# Patient Record
Sex: Female | Born: 1945 | Race: White | Hispanic: No | Marital: Married | Smoking: Never smoker
Health system: Southern US, Community
[De-identification: ages and names within clinical notes are randomized; demographics above are authoritative.]

## PROBLEM LIST (undated history)

## (undated) DIAGNOSIS — N200 Calculus of kidney: Secondary | ICD-10-CM

## (undated) DIAGNOSIS — T7840XA Allergy, unspecified, initial encounter: Secondary | ICD-10-CM

## (undated) DIAGNOSIS — M1990 Unspecified osteoarthritis, unspecified site: Secondary | ICD-10-CM

## (undated) DIAGNOSIS — Z87442 Personal history of urinary calculi: Secondary | ICD-10-CM

## (undated) DIAGNOSIS — Z87440 Personal history of urinary (tract) infections: Secondary | ICD-10-CM

## (undated) DIAGNOSIS — N201 Calculus of ureter: Secondary | ICD-10-CM

## (undated) DIAGNOSIS — Z8719 Personal history of other diseases of the digestive system: Secondary | ICD-10-CM

## (undated) DIAGNOSIS — R319 Hematuria, unspecified: Secondary | ICD-10-CM

## (undated) DIAGNOSIS — E119 Type 2 diabetes mellitus without complications: Secondary | ICD-10-CM

## (undated) DIAGNOSIS — J309 Allergic rhinitis, unspecified: Secondary | ICD-10-CM

## (undated) DIAGNOSIS — K219 Gastro-esophageal reflux disease without esophagitis: Secondary | ICD-10-CM

## (undated) DIAGNOSIS — I10 Essential (primary) hypertension: Secondary | ICD-10-CM

## (undated) DIAGNOSIS — Z9289 Personal history of other medical treatment: Secondary | ICD-10-CM

## (undated) DIAGNOSIS — H269 Unspecified cataract: Secondary | ICD-10-CM

## (undated) DIAGNOSIS — Z973 Presence of spectacles and contact lenses: Secondary | ICD-10-CM

## (undated) DIAGNOSIS — I499 Cardiac arrhythmia, unspecified: Secondary | ICD-10-CM

## (undated) DIAGNOSIS — Z8619 Personal history of other infectious and parasitic diseases: Secondary | ICD-10-CM

## (undated) DIAGNOSIS — R3915 Urgency of urination: Secondary | ICD-10-CM

## (undated) DIAGNOSIS — R350 Frequency of micturition: Secondary | ICD-10-CM

## (undated) DIAGNOSIS — R351 Nocturia: Secondary | ICD-10-CM

## (undated) HISTORY — DX: Personal history of urinary (tract) infections: Z87.440

## (undated) HISTORY — PX: EXTRACORPOREAL SHOCK WAVE LITHOTRIPSY: SHX1557

## (undated) HISTORY — PX: JOINT REPLACEMENT: SHX530

## (undated) HISTORY — DX: Allergy, unspecified, initial encounter: T78.40XA

## (undated) HISTORY — DX: Gastro-esophageal reflux disease without esophagitis: K21.9

## (undated) HISTORY — PX: OTHER SURGICAL HISTORY: SHX169

## (undated) HISTORY — DX: Personal history of other infectious and parasitic diseases: Z86.19

## (undated) HISTORY — DX: Unspecified cataract: H26.9

## (undated) HISTORY — PX: BREAST SURGERY: SHX581

## (undated) HISTORY — PX: CYSTOSCOPY/RETROGRADE/URETEROSCOPY/STONE EXTRACTION WITH BASKET: SHX5317

## (undated) HISTORY — DX: Essential (primary) hypertension: I10

---

## 2001-01-12 ENCOUNTER — Encounter: Payer: Self-pay | Admitting: *Deleted

## 2001-01-12 ENCOUNTER — Encounter: Admission: RE | Admit: 2001-01-12 | Discharge: 2001-01-12 | Payer: Self-pay | Admitting: *Deleted

## 2004-05-26 HISTORY — PX: MENISCUS REPAIR: SHX5179

## 2004-09-20 ENCOUNTER — Other Ambulatory Visit: Admission: RE | Admit: 2004-09-20 | Discharge: 2004-09-20 | Payer: Self-pay | Admitting: Family Medicine

## 2005-09-15 ENCOUNTER — Emergency Department (HOSPITAL_COMMUNITY): Admission: EM | Admit: 2005-09-15 | Discharge: 2005-09-15 | Payer: Self-pay | Admitting: Emergency Medicine

## 2008-04-17 ENCOUNTER — Encounter: Admission: RE | Admit: 2008-04-17 | Discharge: 2008-04-17 | Payer: Self-pay | Admitting: Family Medicine

## 2008-05-16 ENCOUNTER — Ambulatory Visit (HOSPITAL_COMMUNITY): Admission: RE | Admit: 2008-05-16 | Discharge: 2008-05-16 | Payer: Self-pay | Admitting: Gastroenterology

## 2010-10-29 ENCOUNTER — Ambulatory Visit (INDEPENDENT_AMBULATORY_CARE_PROVIDER_SITE_OTHER): Payer: BC Managed Care – PPO | Admitting: Family Medicine

## 2010-10-29 ENCOUNTER — Encounter: Payer: Self-pay | Admitting: Family Medicine

## 2010-10-29 DIAGNOSIS — I10 Essential (primary) hypertension: Secondary | ICD-10-CM

## 2010-10-29 DIAGNOSIS — E118 Type 2 diabetes mellitus with unspecified complications: Secondary | ICD-10-CM | POA: Insufficient documentation

## 2010-10-29 DIAGNOSIS — E119 Type 2 diabetes mellitus without complications: Secondary | ICD-10-CM

## 2010-10-29 DIAGNOSIS — Z87442 Personal history of urinary calculi: Secondary | ICD-10-CM

## 2010-10-29 DIAGNOSIS — J309 Allergic rhinitis, unspecified: Secondary | ICD-10-CM

## 2010-10-29 DIAGNOSIS — E1169 Type 2 diabetes mellitus with other specified complication: Secondary | ICD-10-CM | POA: Insufficient documentation

## 2010-10-29 DIAGNOSIS — K219 Gastro-esophageal reflux disease without esophagitis: Secondary | ICD-10-CM

## 2010-10-29 DIAGNOSIS — E785 Hyperlipidemia, unspecified: Secondary | ICD-10-CM

## 2010-10-29 DIAGNOSIS — J3089 Other allergic rhinitis: Secondary | ICD-10-CM | POA: Insufficient documentation

## 2010-10-29 MED ORDER — ATORVASTATIN CALCIUM 10 MG PO TABS: 10.0000 mg | ORAL_TABLET | Freq: Every day | ORAL | Status: DC

## 2010-10-29 MED ORDER — HYDROCHLOROTHIAZIDE 12.5 MG PO CAPS: 12.5000 mg | ORAL_CAPSULE | Freq: Every day | ORAL | Status: DC

## 2010-10-29 MED ORDER — POTASSIUM CHLORIDE CRYS ER 20 MEQ PO TBCR: 20.0000 meq | EXTENDED_RELEASE_TABLET | Freq: Every day | ORAL | Status: DC

## 2010-10-29 MED ORDER — OMEPRAZOLE 20 MG PO CPDR: 20.0000 mg | DELAYED_RELEASE_CAPSULE | Freq: Every day | ORAL | Status: DC

## 2010-10-29 MED ORDER — FEXOFENADINE HCL 180 MG PO TABS: 180.0000 mg | ORAL_TABLET | Freq: Every day | ORAL | Status: DC

## 2010-10-29 MED ORDER — METFORMIN HCL 500 MG PO TABS: 500.0000 mg | ORAL_TABLET | Freq: Every day | ORAL | Status: DC

## 2010-10-29 MED ORDER — MOMETASONE FUROATE 50 MCG/ACT NA SUSP: 2.0000 | Freq: Every day | NASAL | Status: DC

## 2010-10-29 NOTE — Assessment & Plan Note (Addendum)
Very well controlled Lab at work and f/u PE in 6 mo Refilled metformin Nl foot exam utd opthy

## 2010-10-29 NOTE — Assessment & Plan Note (Addendum)
Consider change hctz to ace at next visit for renal prot Well controlled Disc lifestyle habits  F/u 6 mo

## 2010-10-29 NOTE — Patient Instructions (Signed)
Please send to previous physician for dexa, mammogram, last physical  Take vitamin D3 4000 iu daily  Schedule PE in 6 months (pt will bring her own labs)  Stay active  Eat healthy

## 2010-10-29 NOTE — Progress Notes (Signed)
Subjective:    Patient ID: Laurie Tran, female    DOB: [birth year], 65 y.o.   MRN: [id number]  HPI Here to establish as a new pt  Saw Dr Sonia Baller and then Dr Raquel James Last health mt exam was ? Within the year   Sees Dr Patsi Sears in past for urology Has a cardiolgist   Wants to check a mole on back- wants to check that    DM- on metformin  Is well controlled  Last A1c was 5.9  Eats well and takes care of herself  Denies the sweet tooth  Also exercises regularly - garden and swim and walking   HTN- on hctz  bp good 128/80 Is very well controlled No headache or cp or sob/ edema    GERD- on prilosec Controls symptoms very well -- has been on for 2 years (severe reflux before that)  Used to have heartburn- now never No hoarseness or cough or throat clearing  Hx of kidney stones Not much of a problem lately Has to avoid calcium  She does drink a lot of water   Does take vitamin D  Takes up to 6000 iu per day -- vit d level 66 Has had vit D level before   2/10 foot dexa 1.0 Also had full dexa - in past -need records for that  Thinks it was normal    Last labs were recent - gets at work at [employer]  Hyperlipidemia - on lipitor Also low sat fat diet  No problems  Well controlled    Has allergies Seasonal and environmental and possible food  ? What exactly she is all to   Works at [employer] -- really likes it there  Non smoker  No alcohol   Wants to check mole on back - watching it   Patient Active Problem List  Diagnoses  . DM type 2 (diabetes mellitus, type 2)  . GERD (gastroesophageal reflux disease)  . HTN (hypertension)  . Hyperlipidemia  . History of kidney stones  . Allergic rhinitis   Past Medical History  Diagnosis Date  . History of chicken pox   . Diabetes mellitus   . GERD (gastroesophageal reflux disease)   . Allergy   . Hypertension   . Kidney stone   . History of UTI    Past Surgical History  Procedure Date  .  Lipotripsy   . J basket retrieval     for kidney stones  . Meniscus repair 2006    rt knee  . Breast surgery 1085    breast biopsy benign   History  Substance Use Topics  . Smoking status: Never Smoker   . Smokeless tobacco: Not on file  . Alcohol Use: No   Family History  Problem Relation Age of Onset  . Cancer Mother     breast CA  . Arthritis Paternal Grandmother   . Stroke Paternal Grandmother   . Diabetes Paternal Grandmother    Allergies  Allergen Reactions  . Celebrex (Celecoxib) Hives    And congestion  . Vioxx (Rofecoxib) Hives    And congestion   No current outpatient prescriptions on file prior to visit.     Review of Systems Review of Systems  Constitutional: Negative for fever, appetite change, fatigue and unexpected weight change.  Eyes: Negative for pain and visual disturbance.  Respiratory: Negative for cough and shortness of breath.   Cardiovascular: Negative.   Gastrointestinal: Negative for nausea, diarrhea and constipation.  Genitourinary: Negative for  urgency and frequency.  Skin: Negative for pallor. pos for mole on back - raised and irritates clothing  Neurological: Negative for weakness, light-headedness, numbness and headaches.  Hematological: Negative for adenopathy. Does not bruise/bleed easily.  Psychiatric/Behavioral: Negative for dysphoric mood. The patient is not nervous/anxious.          Objective:   Physical Exam  Constitutional: She appears well-developed and well-nourished. No distress.  HENT:  Head: Normocephalic and atraumatic.  Right Ear: External ear normal.  Left Ear: External ear normal.  Nose: Nose normal.  Mouth/Throat: Oropharynx is clear and moist.  Eyes: Conjunctivae and EOM are normal. Pupils are equal, round, and reactive to light.  Neck: Normal range of motion. Neck supple. No JVD present. Carotid bruit is not present. No thyromegaly present.  Cardiovascular: Normal rate, regular rhythm, normal heart sounds  and intact distal pulses.   Pulmonary/Chest: Effort normal and breath sounds normal. No respiratory distress. She has no wheezes. She exhibits no tenderness.  Abdominal: Soft. Bowel sounds are normal. She exhibits no distension and no abdominal bruit. There is no tenderness.  Musculoskeletal: Normal range of motion. She exhibits no edema and no tenderness.  Lymphadenopathy:    She has no cervical adenopathy.  Neurological: She is alert. She has normal reflexes. Coordination normal.  Skin: Skin is warm and dry. No rash noted. No erythema. No pallor.  Psychiatric: She has a normal mood and affect.          Assessment & Plan:

## 2010-10-31 NOTE — Assessment & Plan Note (Signed)
Seasonal and controlled by allegra Disc allergen avoidance Refilled med - she can get by px

## 2010-10-31 NOTE — Assessment & Plan Note (Signed)
Well controlled with lipitor and diet  Rev last labs with pt in detail  Rev low sat fat diet  F/u 6 mo  Refilled lipitor

## 2010-10-31 NOTE — Assessment & Plan Note (Signed)
Doing well  Enc fluids Will eventually need to start some ca for bone protection even though they are ca oxalate stones

## 2010-10-31 NOTE — Assessment & Plan Note (Signed)
Well controlled with ppi Symptoms were previously quite severe Will plan to stay on this  Aware of bone density risks Disc diet

## 2011-01-02 ENCOUNTER — Ambulatory Visit (HOSPITAL_COMMUNITY): Admission: RE | Admit: 2011-01-02 | Payer: BC Managed Care – PPO | Source: Ambulatory Visit

## 2011-01-06 ENCOUNTER — Ambulatory Visit (HOSPITAL_COMMUNITY)
Admission: RE | Admit: 2011-01-06 | Discharge: 2011-01-06 | Disposition: A | Discharge status: Home / Self Care | Payer: BC Managed Care – PPO | Source: Ambulatory Visit | Attending: Urology | Admitting: Urology

## 2011-01-06 ENCOUNTER — Ambulatory Visit (HOSPITAL_COMMUNITY): Payer: BC Managed Care – PPO

## 2011-01-06 DIAGNOSIS — I10 Essential (primary) hypertension: Secondary | ICD-10-CM | POA: Insufficient documentation

## 2011-01-06 DIAGNOSIS — N200 Calculus of kidney: Secondary | ICD-10-CM | POA: Insufficient documentation

## 2011-01-06 DIAGNOSIS — E119 Type 2 diabetes mellitus without complications: Secondary | ICD-10-CM | POA: Insufficient documentation

## 2011-01-06 LAB — GLUCOSE, CAPILLARY: Glucose-Capillary: 102 mg/dL — ABNORMAL HIGH (ref 70–99)

## 2011-05-30 ENCOUNTER — Encounter: Payer: Self-pay | Admitting: Family Medicine

## 2011-05-30 ENCOUNTER — Ambulatory Visit (INDEPENDENT_AMBULATORY_CARE_PROVIDER_SITE_OTHER): Payer: BC Managed Care – PPO | Admitting: Family Medicine

## 2011-05-30 VITALS — BP 118/76 | HR 84 | Temp 99.2°F | Ht 64.25 in | Wt 124.2 lb

## 2011-05-30 DIAGNOSIS — Z1231 Encounter for screening mammogram for malignant neoplasm of breast: Secondary | ICD-10-CM

## 2011-05-30 DIAGNOSIS — Z23 Encounter for immunization: Secondary | ICD-10-CM

## 2011-05-30 DIAGNOSIS — E119 Type 2 diabetes mellitus without complications: Secondary | ICD-10-CM

## 2011-05-30 DIAGNOSIS — D126 Benign neoplasm of colon, unspecified: Secondary | ICD-10-CM

## 2011-05-30 DIAGNOSIS — Z0000 Encounter for general adult medical examination without abnormal findings: Secondary | ICD-10-CM | POA: Insufficient documentation

## 2011-05-30 DIAGNOSIS — K635 Polyp of colon: Secondary | ICD-10-CM | POA: Insufficient documentation

## 2011-05-30 DIAGNOSIS — E785 Hyperlipidemia, unspecified: Secondary | ICD-10-CM

## 2011-05-30 DIAGNOSIS — I10 Essential (primary) hypertension: Secondary | ICD-10-CM

## 2011-05-30 MED ORDER — OMEPRAZOLE 20 MG PO CPDR: 20.0000 mg | DELAYED_RELEASE_CAPSULE | Freq: Every day | ORAL | Status: DC

## 2011-05-30 MED ORDER — METFORMIN HCL 500 MG PO TABS: 500.0000 mg | ORAL_TABLET | Freq: Every day | ORAL | Status: DC

## 2011-05-30 MED ORDER — HYDROCHLOROTHIAZIDE 12.5 MG PO CAPS: 12.5000 mg | ORAL_CAPSULE | Freq: Every day | ORAL | Status: DC

## 2011-05-30 MED ORDER — ATORVASTATIN CALCIUM 10 MG PO TABS: 10.0000 mg | ORAL_TABLET | Freq: Every day | ORAL | Status: DC

## 2011-05-30 NOTE — Patient Instructions (Signed)
Keep up the good work with health habits  We will refer you for mammogram and colonoscopy at check out  Tdap vaccine today If you are interested in a shingles/zoster vaccine - call your insurance to check on coverage,( you should not get it within 1 month of other vaccines) , then call us for a prescription  for it to take to a pharmacy that gives the shot  Make sure to make your yearly eye exam for diabetes  Follow up in 6 months and bring your labs

## 2011-05-30 NOTE — Progress Notes (Signed)
Subjective:    Patient ID: Laurie Tran, female    DOB: 11/24/1945, 66 y.o.   MRN: [id number]  HPI Here for health maintenance exam and to review chronic medical problems   Feels good  No new medical problems   Wt is stable with good bmi of 21  Gyn exam- last was November -- with gyn , sees [facility] Nl pap   Mam 11/11- due for one - at ? [facility]  Mother had breast cancer  Self exam - no lumps  Gyn examined her   Tdap-- ? Last one - over 10 years , wants one  Flu--had shot in nov  Zoster status-- never had dz or vaccine - interested in vaccine   Colon screen-- last colonoscopy was about 5 years ago -- recommended f/u in 5 years  Has had polyps in past and brother had colon cancer -- needs that [facility]   DM2-- very good control a1c 5.8 -- eats very well and mt wt  Also exercises occ Not on ace opthy- was about 2 years ago - will schedule that exam herself  Sugars- does not really check (in a get fit program with diet and exercise plan )   bp is 118/76     Today-- good control on microzide No cp or palpitations or headaches or edema  No side effects to medicines    Lipids - on lipitor  LDL was 108- went up a bit -- after change from reg to generic  HDL is 52  Patient Active Problem List  Diagnoses  . DM type 2 (diabetes mellitus, type 2)  . GERD (gastroesophageal reflux disease)  . HTN (hypertension)  . Hyperlipidemia  . History of kidney stones  . Allergic rhinitis  . Routine general medical examination at a health care facility  . Other screening mammogram  . Colon polyps   Past Medical History  Diagnosis Date  . History of chicken pox   . Diabetes mellitus   . GERD (gastroesophageal reflux disease)   . Allergy   . Hypertension   . Kidney stone   . History of UTI    Past Surgical History  Procedure Date  . Lipotripsy   . J basket retrieval     for kidney stones  . Meniscus repair 2006    rt knee  . Breast surgery 1085    breast biopsy  benign   History  Substance Use Topics  . Smoking status: Never Smoker   . Smokeless tobacco: Not on file  . Alcohol Use: No   Family History  Problem Relation Age of Onset  . Cancer Mother     breast CA  . Arthritis Paternal Grandmother   . Stroke Paternal Grandmother   . Diabetes Paternal Grandmother    Allergies  Allergen Reactions  . Celebrex (Celecoxib) Hives    And congestion  . Vioxx (Rofecoxib) Hives    And congestion   Current Outpatient Prescriptions on File Prior to Visit  Medication Sig Dispense Refill  . diphenhydrAMINE (BENADRYL) 25 mg capsule Take 25 mg by mouth at bedtime.        . fexofenadine (ALLEGRA) 180 MG tablet Take 1 tablet (180 mg total) by mouth daily.  30 tablet  11  . mometasone (NASONEX) 50 MCG/ACT nasal spray Place 2 sprays into the nose daily.  17 g  11  . potassium chloride SA (K-DUR,KLOR-CON) 20 MEQ tablet Take 1 tablet (20 mEq total) by mouth daily.  30 tablet  11          Review of Systems Review of Systems  Constitutional: Negative for fever, appetite change, fatigue and unexpected weight change.  Eyes: Negative for pain and visual disturbance.  Respiratory: Negative for cough and shortness of breath.   Cardiovascular: Negative for cp or palpitations    Gastrointestinal: Negative for nausea, diarrhea and constipation.  Genitourinary: Negative for urgency and frequency.  Skin: Negative for pallor or rash   Neurological: Negative for weakness, light-headedness, numbness and headaches.  Hematological: Negative for adenopathy. Does not bruise/bleed easily.  Psychiatric/Behavioral: Negative for dysphoric mood. The patient is not nervous/anxious.          Objective:   Physical Exam  Constitutional: She appears well-developed and well-nourished. No distress.  HENT:  Head: Normocephalic and atraumatic.  Right Ear: External ear normal.  Left Ear: External ear normal.  Nose: Nose normal.  Mouth/Throat: Oropharynx is clear and  moist.  Eyes: Conjunctivae and EOM are normal. Pupils are equal, round, and reactive to light. No scleral icterus.  Neck: Normal range of motion. Neck supple. No JVD present. Carotid bruit is not present. No thyromegaly present.  Cardiovascular: Normal rate, regular rhythm, normal heart sounds and intact distal pulses.  Exam reveals no gallop.   Pulmonary/Chest: Effort normal and breath sounds normal. No respiratory distress. She has no wheezes. She exhibits no tenderness.  Abdominal: Soft. Bowel sounds are normal. She exhibits no distension, no abdominal bruit and no mass. There is no tenderness.  Musculoskeletal: Normal range of motion. She exhibits no edema and no tenderness.  Lymphadenopathy:    She has no cervical adenopathy.  Neurological: She is alert. She has normal reflexes. No cranial nerve deficit. She exhibits normal muscle tone. Coordination normal.  Skin: Skin is warm and dry. No rash noted. No erythema. No pallor.  Psychiatric: She has a normal mood and affect.          Assessment & Plan:

## 2011-06-01 NOTE — Assessment & Plan Note (Signed)
Due for 5 year colonoscopy  Will schedule that

## 2011-06-01 NOTE — Assessment & Plan Note (Signed)
Very well controlled with metformin and good health habits a1c below 6 Pt will make own opthy exam  Unable to add ace  Nl foot exam

## 2011-06-01 NOTE — Assessment & Plan Note (Signed)
bp in fair control at this time  No changes needed  Disc lifstyle change with low sodium diet and exercise   

## 2011-06-01 NOTE — Assessment & Plan Note (Signed)
Reviewed health habits including diet and exercise and skin cancer prevention Also reviewed health mt list, fam hx and immunizations   Rev wellness labs in detail Urged to consider zoster vaccine

## 2011-06-01 NOTE — Assessment & Plan Note (Signed)
Disc goals for lipids and reasons to control them Rev labs with pt Rev low sat fat diet in detail   

## 2011-06-01 NOTE — Assessment & Plan Note (Signed)
Nl exam with gyn in nov sched mam today for yearly screen

## 2011-06-17 ENCOUNTER — Encounter: Payer: Self-pay | Admitting: Family Medicine

## 2011-06-18 ENCOUNTER — Encounter: Payer: Self-pay | Admitting: *Deleted

## 2011-11-12 ENCOUNTER — Other Ambulatory Visit: Payer: Self-pay | Admitting: *Deleted

## 2011-11-12 MED ORDER — FEXOFENADINE HCL 180 MG PO TABS: 180.0000 mg | ORAL_TABLET | Freq: Every day | ORAL | Status: DC

## 2011-11-12 NOTE — Telephone Encounter (Signed)
Received faxed refill request from pharmacy. Refill sent to pharmacy electronically. 

## 2011-11-13 ENCOUNTER — Ambulatory Visit (INDEPENDENT_AMBULATORY_CARE_PROVIDER_SITE_OTHER): Payer: BC Managed Care – PPO | Admitting: Family Medicine

## 2011-11-13 ENCOUNTER — Telehealth: Payer: Self-pay | Admitting: Family Medicine

## 2011-11-13 ENCOUNTER — Encounter: Payer: Self-pay | Admitting: Family Medicine

## 2011-11-13 VITALS — BP 130/80 | HR 80 | Temp 98.3°F | Wt 128.8 lb

## 2011-11-13 DIAGNOSIS — Z8619 Personal history of other infectious and parasitic diseases: Secondary | ICD-10-CM | POA: Insufficient documentation

## 2011-11-13 DIAGNOSIS — B029 Zoster without complications: Secondary | ICD-10-CM

## 2011-11-13 MED ORDER — VALACYCLOVIR HCL 1 G PO TABS: 1000.0000 mg | ORAL_TABLET | Freq: Three times a day (TID) | ORAL | Status: DC

## 2011-11-13 NOTE — Patient Instructions (Addendum)
Take the valtrex for 1 week.  If you continue to have pain or sensitivity that is intolerable (and worth considering treatment) then notify [facility].

## 2011-11-13 NOTE — Assessment & Plan Note (Signed)
Start valtex, f/u with PCP if pain persists.  Path/phys d/w pt.  She understood.

## 2011-11-13 NOTE — Telephone Encounter (Signed)
Caller: Ritha/Patient; PCP: Roxy Manns A.; CB#: [phone];  Call regarding two Insect Bites On R side of lower abdomen  -onset 11/03/11 and now has pain on R side. Bites are purple in color and slightly puffy. Skin is sensitive to touch and pain worsens at night- feels deeper and throbs. She takes Fexofenodine every morning and Benedryl HS for allergies and has taken Alleve twice over past 10 days which has helped with  pain. Afebrile. She has had headaches on and off. Calamine used topically did not help. Triage and Care advice per Bites and Stings Protocol and appnt advised within 4 hours- she has had 4 tick bites in the past, not sure if bites are from ticks. Appnt scheduled for 1230 11/13/11 with Dr. Para March.

## 2011-11-13 NOTE — Progress Notes (Signed)
Several 'bites' on R lower abd.  Now the pain is dermatomal and the skin is sensitive.  Started about 10 days ago.  No known bites/trigger.  Burning/sunburn type pain with deeper ache.  No FCNAVD.  Pain with clothing touching the skin.   Meds, vitals, and allergies reviewed.   ROS: See HPI.  Otherwise, noncontributory.  nad ncat rrr ctab abd with dermatomal distribution of altered skin sensitivity with unilateral area of resolving lesions on the distal dermatome in RLQ

## 2011-11-25 ENCOUNTER — Ambulatory Visit (INDEPENDENT_AMBULATORY_CARE_PROVIDER_SITE_OTHER): Payer: BC Managed Care – PPO | Admitting: Family Medicine

## 2011-11-25 ENCOUNTER — Encounter: Payer: Self-pay | Admitting: Family Medicine

## 2011-11-25 VITALS — BP 120/70 | HR 82 | Temp 98.2°F | Ht 64.0 in | Wt 125.8 lb

## 2011-11-25 DIAGNOSIS — E785 Hyperlipidemia, unspecified: Secondary | ICD-10-CM

## 2011-11-25 DIAGNOSIS — J3089 Other allergic rhinitis: Secondary | ICD-10-CM

## 2011-11-25 DIAGNOSIS — I10 Essential (primary) hypertension: Secondary | ICD-10-CM

## 2011-11-25 DIAGNOSIS — J309 Allergic rhinitis, unspecified: Secondary | ICD-10-CM

## 2011-11-25 DIAGNOSIS — E119 Type 2 diabetes mellitus without complications: Secondary | ICD-10-CM

## 2011-11-25 MED ORDER — LORATADINE 10 MG PO TABS: 10.0000 mg | ORAL_TABLET | Freq: Every day | ORAL | Status: DC

## 2011-11-25 NOTE — Assessment & Plan Note (Signed)
Pending a1c Other labs rev Stable wt Good diet and exercise  On metformin

## 2011-11-25 NOTE — Assessment & Plan Note (Signed)
bp in fair control at this time  No changes needed  Disc lifstyle change with low sodium diet and exercise   Was better on 2nd check  

## 2011-11-25 NOTE — Assessment & Plan Note (Signed)
Pt changed to claritin because allegra not working well occ benadryl at night  Px written for flex card today Will update

## 2011-11-25 NOTE — Patient Instructions (Addendum)
If you are interested in a shingles/zoster vaccine - call your insurance to check on coverage,( you should not get it within 1 month of other vaccines) , then call us for a prescription  for it to take to a pharmacy that gives the shot   I would wait at least 2-3 months to get over the shingles you already have  Here is a px for claritin  Cholesterol is good  Waiting on your a1c  We will keep an eye on your thyroid Schedule annual exam with labs prior in 6 months

## 2011-11-25 NOTE — Progress Notes (Signed)
Subjective:    Patient ID: Laurie Tran, female    DOB: Sep 29, 1945, 66 y.o.   MRN: [id number]  HPI Here for f/u of chronic problems , also new issue of elevated tsh Is feeling ok overall   Had an episode of shingles  Slowly getting better from that -- has foot pain and leg pain  Last week was tx with valtrex  Is interested in vaccine     bp is  Up a bit    Today No cp or palpitations or headaches or edema  No side effects to medicines   BP Readings from Last 3 Encounters:  11/25/11 145/88  11/13/11 130/80  05/30/11 118/76   She takes microzide currently  DM Diabetes Home sugar results - does not check them-is well controlled  DM diet - overall good , about the same Exercise - some of the time Symptoms- none Sugar was 107 recent labs- a1c is pending  No problems with medications metformin  Last eye exam - within the year   Lipids well controlled LDL is 90- stable on lipitor and diet  Rev labs she brought in  tsh is elevated slt at 6.04 Rest of thyroid profile is normal  Skin is dry - has noticed it  Some fatigue due to her pain  No lump in her neck   Will watch this   Changed to claritin for her allergies Takes some benadryl at night    Patient Active Problem List  Diagnosis  . DM type 2 (diabetes mellitus, type 2)  . GERD (gastroesophageal reflux disease)  . HTN (hypertension)  . Hyperlipidemia  . History of kidney stones  . Allergic rhinitis  . Routine general medical examination at a health care facility  . Other screening mammogram  . Colon polyps  . Shingles   Past Medical History  Diagnosis Date  . History of chicken pox   . Diabetes mellitus   . GERD (gastroesophageal reflux disease)   . Allergy   . Hypertension   . Kidney stone   . History of UTI    Past Surgical History  Procedure Date  . Lipotripsy   . J basket retrieval     for kidney stones  . Meniscus repair 2006    rt knee  . Breast surgery 1085    breast biopsy benign     History  Substance Use Topics  . Smoking status: Never Smoker   . Smokeless tobacco: Not on file  . Alcohol Use: No   Family History  Problem Relation Age of Onset  . Cancer Mother     breast CA  . Arthritis Paternal Grandmother   . Stroke Paternal Grandmother   . Diabetes Paternal Grandmother    Allergies  Allergen Reactions  . Celebrex (Celecoxib) Hives    And congestion  . Vioxx (Rofecoxib) Hives    And congestion   Current Outpatient Prescriptions on File Prior to Visit  Medication Sig Dispense Refill  . atorvastatin (LIPITOR) 10 MG tablet Take 1 tablet (10 mg total) by mouth daily.  30 tablet  11  . B Complex Vitamins (B-COMPLEX/B-12 PO) Take by mouth daily.      . diphenhydrAMINE (BENADRYL) 25 mg capsule Take 25 mg by mouth at bedtime.        . fexofenadine (ALLEGRA) 180 MG tablet Take 1 tablet (180 mg total) by mouth daily.  30 tablet  6  . hydrochlorothiazide (MICROZIDE) 12.5 MG capsule Take 1 capsule (12.5 mg total) by  mouth daily.  30 capsule  11  . loratadine (CLARITIN) 10 MG tablet Take 10 mg by mouth daily.      . metFORMIN (GLUCOPHAGE) 500 MG tablet Take 1 tablet (500 mg total) by mouth at bedtime.  30 tablet  11  . Omega-3 Fatty Acids (FISH OIL) 1000 MG CAPS Take 1,000 mg by mouth 2 (two) times daily.      Marland Kitchen omeprazole (PRILOSEC) 20 MG capsule Take 1 capsule (20 mg total) by mouth daily.  30 capsule  11  . VITAMIN D, ERGOCALCIFEROL, PO Take by mouth daily.      . valACYclovir (VALTREX) 1000 MG tablet Take 1 tablet (1,000 mg total) by mouth 3 (three) times daily.  21 tablet  0         Review of Systems Review of Systems  Constitutional: Negative for fever, appetite change, fatigue and unexpected weight change.  Eyes: Negative for pain and visual disturbance.  ENT pos for rhinorrhea and sneezing from allergies , neg for sinus pain  Respiratory: Negative for cough and shortness of breath.   Cardiovascular: Negative for cp or palpitations     Gastrointestinal: Negative for nausea, diarrhea and constipation.  Genitourinary: Negative for urgency and frequency.  Skin: Negative for pallor or rash   Neurological: Negative for weakness, light-headedness, numbness and headaches.  Hematological: Negative for adenopathy. Does not bruise/bleed easily.  Psychiatric/Behavioral: Negative for dysphoric mood. The patient is not nervous/anxious.         Objective:   Physical Exam  Constitutional: She appears well-developed and well-nourished. No distress.  HENT:  Head: Normocephalic and atraumatic.  Left Ear: External ear normal.  Mouth/Throat: Oropharynx is clear and moist. No oropharyngeal exudate.       Nares boggy and pale with some clear rhinorrhea   Eyes: Conjunctivae and EOM are normal. Right eye exhibits no discharge. Left eye exhibits no discharge.  Neck: Normal range of motion. Neck supple. No JVD present. Carotid bruit is not present. No thyromegaly present.  Cardiovascular: Normal rate, regular rhythm, normal heart sounds and intact distal pulses.  Exam reveals no gallop.   Pulmonary/Chest: Effort normal and breath sounds normal. No respiratory distress. She has no wheezes.  Abdominal: Soft. Bowel sounds are normal. She exhibits no distension, no abdominal bruit and no mass. There is no tenderness.  Musculoskeletal: Normal range of motion. She exhibits no edema and no tenderness.  Lymphadenopathy:    She has no cervical adenopathy.  Neurological: She is alert. She has normal reflexes. No cranial nerve deficit. She exhibits normal muscle tone. Coordination normal.  Skin: Skin is warm and dry. No rash noted. No erythema. No pallor.  Psychiatric: She has a normal mood and affect.          Assessment & Plan:

## 2011-12-12 ENCOUNTER — Telehealth: Payer: Self-pay

## 2011-12-12 NOTE — Telephone Encounter (Signed)
That is absolutely fine- stop the claritin and let me know if she does not improve Thanks for the update

## 2011-12-12 NOTE — Telephone Encounter (Signed)
Pt left v/m recently diagnosed with shingles; now extreme vertigo; PA at work prescribed Antivert 25 mg. Dr Milinda Antis had told pt to stop Fexofenadine and replace with Claritin. Pt concerned that might have caused vertigo and is stopping Claritin and will restart Fexofenadine. Wanted Dr Milinda Antis to know.

## 2011-12-15 NOTE — Telephone Encounter (Signed)
Patient notified as instructed by telephone. Patient states that she is still having vertigo and thinks that it may related to the shingles. Patient stated that she will continue to monitor this and will call back in a few days if this continues.

## 2011-12-15 NOTE — Telephone Encounter (Signed)
Please do let me know if no improvement or if it is severe  (or headache/ vomiting or other symptoms)

## 2011-12-16 NOTE — Telephone Encounter (Signed)
Patient states she is still having dizziness.Made her appointment to see Dr. Milinda Antis.

## 2012-02-03 ENCOUNTER — Ambulatory Visit (INDEPENDENT_AMBULATORY_CARE_PROVIDER_SITE_OTHER): Payer: BC Managed Care – PPO | Admitting: *Deleted

## 2012-02-03 DIAGNOSIS — Z2911 Encounter for prophylactic immunotherapy for respiratory syncytial virus (RSV): Secondary | ICD-10-CM

## 2012-02-03 DIAGNOSIS — Z23 Encounter for immunization: Secondary | ICD-10-CM

## 2012-03-02 ENCOUNTER — Encounter: Payer: Self-pay | Admitting: Cardiovascular Disease

## 2012-05-28 ENCOUNTER — Encounter: Payer: Self-pay | Admitting: Family Medicine

## 2012-05-28 ENCOUNTER — Other Ambulatory Visit: Payer: BC Managed Care – PPO

## 2012-05-28 ENCOUNTER — Ambulatory Visit (INDEPENDENT_AMBULATORY_CARE_PROVIDER_SITE_OTHER): Payer: PRIVATE HEALTH INSURANCE | Admitting: Family Medicine

## 2012-05-28 VITALS — BP 136/72 | HR 78 | Temp 98.4°F | Ht 64.25 in | Wt 129.0 lb

## 2012-05-28 DIAGNOSIS — E785 Hyperlipidemia, unspecified: Secondary | ICD-10-CM

## 2012-05-28 DIAGNOSIS — Z23 Encounter for immunization: Secondary | ICD-10-CM

## 2012-05-28 DIAGNOSIS — Z1231 Encounter for screening mammogram for malignant neoplasm of breast: Secondary | ICD-10-CM

## 2012-05-28 DIAGNOSIS — I10 Essential (primary) hypertension: Secondary | ICD-10-CM

## 2012-05-28 DIAGNOSIS — Z0000 Encounter for general adult medical examination without abnormal findings: Secondary | ICD-10-CM

## 2012-05-28 DIAGNOSIS — E119 Type 2 diabetes mellitus without complications: Secondary | ICD-10-CM

## 2012-05-28 MED ORDER — METFORMIN HCL 500 MG PO TABS: 500.0000 mg | ORAL_TABLET | Freq: Every day | ORAL | Status: DC

## 2012-05-28 MED ORDER — ATORVASTATIN CALCIUM 10 MG PO TABS: 10.0000 mg | ORAL_TABLET | Freq: Every day | ORAL | Status: DC

## 2012-05-28 MED ORDER — OMEPRAZOLE 20 MG PO CPDR: 20.0000 mg | DELAYED_RELEASE_CAPSULE | Freq: Every day | ORAL | Status: DC

## 2012-05-28 MED ORDER — HYDROCHLOROTHIAZIDE 12.5 MG PO CAPS: 12.5000 mg | ORAL_CAPSULE | Freq: Every day | ORAL | Status: DC

## 2012-05-28 NOTE — Progress Notes (Signed)
Subjective:    Patient ID: Laurie Tran, female    DOB: 1946/04/28, 67 y.o.   MRN: [id number]  HPI Here for health maintenance exam and to review chronic medical problems    Is feeling well overall  Nothing new going on    She was supposed to get labs from work and forgot -- labs need to go to [facility]  Wt is up 4 lb with bmi of  21 Is eating healthy and getting exercise   Flu vaccine--got it in sept   Pneumovax-- about 5 years ago - wants to get one today  mammo 1/13 - goes to goes to [facility] (she thinks)  Sister just died this fall also - really hard on her  Self exam - no lumps or changes   Pap 11/12 No gyn problems or symptoms   colonosc 2/13 Is ok-no stool changes   DM- no issues with control  On metformin Last a1c 6.1   Lipid- due for check   bp is stable today  No cp or palpitations or headaches or edema  No side effects to medicines  BP Readings from Last 3 Encounters:  05/28/12 136/72  11/25/11 120/70  11/13/11 130/80     Mood - if fine with grief / denies depression  Has a lot of support   Patient Active Problem List  Diagnosis  . DM type 2 (diabetes mellitus, type 2)  . GERD (gastroesophageal reflux disease)  . HTN (hypertension)  . Hyperlipidemia  . History of kidney stones  . Perennial allergic rhinitis  . Routine general medical examination at a health care facility  . Other screening mammogram  . Colon polyps  . Shingles   Past Medical History  Diagnosis Date  . History of chicken pox   . Diabetes mellitus   . GERD (gastroesophageal reflux disease)   . Allergy   . Hypertension   . Kidney stone   . History of UTI    Past Surgical History  Procedure Date  . Lipotripsy   . J basket retrieval     for kidney stones  . Meniscus repair 2006    rt knee  . Breast surgery 1085    breast biopsy benign   History  Substance Use Topics  . Smoking status: Never Smoker   . Smokeless tobacco: Not on file  . Alcohol Use: No   Family  History  Problem Relation Age of Onset  . Cancer Mother     breast CA  . Arthritis Paternal Grandmother   . Stroke Paternal Grandmother   . Diabetes Paternal Grandmother    Allergies  Allergen Reactions  . Celebrex (Celecoxib) Hives    And congestion  . Vioxx (Rofecoxib) Hives    And congestion   Current Outpatient Prescriptions on File Prior to Visit  Medication Sig Dispense Refill  . atorvastatin (LIPITOR) 10 MG tablet Take 1 tablet (10 mg total) by mouth daily.  30 tablet  11  . B Complex Vitamins (B-COMPLEX/B-12 PO) Take by mouth daily.      . diphenhydrAMINE (BENADRYL) 25 mg capsule Take 25 mg by mouth at bedtime.       . hydrochlorothiazide (MICROZIDE) 12.5 MG capsule Take 1 capsule (12.5 mg total) by mouth daily.  30 capsule  11  . metFORMIN (GLUCOPHAGE) 500 MG tablet Take 1 tablet (500 mg total) by mouth at bedtime.  30 tablet  11  . Omega-3 Fatty Acids (FISH OIL) 1000 MG CAPS Take 1,000 mg by mouth  daily.       . omeprazole (PRILOSEC) 20 MG capsule Take 1 capsule (20 mg total) by mouth daily.  30 capsule  11  . VITAMIN D, ERGOCALCIFEROL, PO Take 1 tablet by mouth daily.           Review of Systems Review of Systems  Constitutional: Negative for fever, appetite change, fatigue and unexpected weight change.  Eyes: Negative for pain and visual disturbance.  Respiratory: Negative for cough and shortness of breath.   Cardiovascular: Negative for cp or palpitations    Gastrointestinal: Negative for nausea, diarrhea and constipation.  Genitourinary: Negative for urgency and frequency.  Skin: Negative for pallor or rash   Neurological: Negative for weakness, light-headedness, numbness and headaches.  Hematological: Negative for adenopathy. Does not bruise/bleed easily.  Psychiatric/Behavioral: Negative for dysphoric mood. The patient is not nervous/anxious.         Objective:   Physical Exam  Constitutional: She appears well-developed and well-nourished. No distress.    HENT:  Head: Normocephalic and atraumatic.  Right Ear: External ear normal.  Left Ear: External ear normal.  Nose: Nose normal.  Mouth/Throat: Oropharynx is clear and moist.  Eyes: Conjunctivae normal and EOM are normal. Pupils are equal, round, and reactive to light. Right eye exhibits no discharge. Left eye exhibits no discharge. No scleral icterus.  Neck: Normal range of motion. Neck supple. No JVD present. Carotid bruit is not present. No thyromegaly present.  Cardiovascular: Normal rate, regular rhythm, normal heart sounds and intact distal pulses.  Exam reveals no gallop.   Pulmonary/Chest: Effort normal and breath sounds normal. No respiratory distress. She has no wheezes. She exhibits no tenderness.  Abdominal: Soft. Bowel sounds are normal. She exhibits no distension, no abdominal bruit and no mass. There is no tenderness.  Genitourinary: No breast swelling, tenderness, discharge or bleeding.       Breast exam: No mass, nodules, thickening, tenderness, bulging, retraction, inflamation, nipple discharge or skin changes noted.  No axillary or clavicular LA.  Chaperoned exam.    Musculoskeletal: She exhibits no edema and no tenderness.  Lymphadenopathy:    She has no cervical adenopathy.  Neurological: She is alert. She has normal reflexes. No cranial nerve deficit. She exhibits normal muscle tone. Coordination normal.  Skin: Skin is warm and dry. No rash noted. No erythema. No pallor.  Psychiatric: She has a normal mood and affect.          Assessment & Plan:

## 2012-05-28 NOTE — Assessment & Plan Note (Signed)
Lab today lipitor and diet Rev low sat fat diet

## 2012-05-28 NOTE — Assessment & Plan Note (Signed)
bp in fair control at this time  No changes needed  Disc lifstyle change with low sodium diet and exercise  Lab today 

## 2012-05-28 NOTE — Assessment & Plan Note (Signed)
a1c today  Sugars have been well controlled on once daily metformin  opthy up to date  No problems or symptoms mt a healthy wt

## 2012-05-28 NOTE — Assessment & Plan Note (Signed)
Reviewed health habits including diet and exercise and skin cancer prevention Also reviewed health mt list, fam hx and immunizations  Lab today Pneumovax today

## 2012-05-28 NOTE — Assessment & Plan Note (Signed)
Pt will make own mam appt Nl breast exam  Enc self exams fam hx

## 2012-05-28 NOTE — Patient Instructions (Signed)
Don't forget to schedule your annual mammogram  Labs today Pneumonia vaccine today

## 2012-06-01 LAB — COMPREHENSIVE METABOLIC PANEL
AST: 16 IU/L (ref 0–40)
Albumin/Globulin Ratio: 1.6 (ref 1.1–2.5)
Albumin: 4.4 g/dL (ref 3.6–4.8)
Alkaline Phosphatase: 82 IU/L (ref 39–117)
BUN/Creatinine Ratio: 29 — ABNORMAL HIGH (ref 11–26)
BUN: 24 mg/dL (ref 8–27)
Creatinine, Ser: 0.83 mg/dL (ref 0.57–1.00)
GFR calc Af Amer: 86 mL/min/{1.73_m2} (ref >59)
Globulin, Total: 2.7 g/dL (ref 1.5–4.5)
Sodium: 138 mmol/L (ref 134–144)
Total Bilirubin: 0.5 mg/dL (ref 0.0–1.2)

## 2012-06-01 LAB — LIPID PANEL
Chol/HDL Ratio: 3.6 ratio units (ref 0.0–4.4)
HDL: 48 mg/dL (ref >39)
LDL Calculated: 103 mg/dL — ABNORMAL HIGH (ref 0–99)
Triglycerides: 106 mg/dL (ref 0–149)
VLDL Cholesterol Cal: 21 mg/dL (ref 5–40)

## 2012-06-01 LAB — CBC WITH DIFFERENTIAL/PLATELET
Eos: 3 % (ref 0–5)
HCT: 39 % (ref 34.0–46.6)
Hemoglobin: 13.3 g/dL (ref 11.1–15.9)
Lymphocytes Absolute: 2.4 10*3/uL (ref 0.7–3.1)
MCHC: 34.1 g/dL (ref 31.5–35.7)
MCV: 85 fL (ref 79–97)
Monocytes Absolute: 0.4 10*3/uL (ref 0.1–0.9)
Monocytes: 5 % (ref 4–12)
Neutrophils Absolute: 4.3 10*3/uL (ref 1.4–7.0)
WBC: 7.3 10*3/uL (ref 3.4–10.8)

## 2012-06-02 ENCOUNTER — Encounter: Payer: Self-pay | Admitting: *Deleted

## 2012-07-21 ENCOUNTER — Encounter: Payer: Self-pay | Admitting: Cardiovascular Disease

## 2012-07-22 ENCOUNTER — Other Ambulatory Visit: Payer: Self-pay | Admitting: Family Medicine

## 2012-08-20 ENCOUNTER — Other Ambulatory Visit: Payer: Self-pay

## 2012-08-20 MED ORDER — ATORVASTATIN CALCIUM 10 MG PO TABS: 10.0000 mg | ORAL_TABLET | Freq: Every day | ORAL | Status: DC

## 2012-08-20 NOTE — Telephone Encounter (Signed)
[facility] Faxed refill atorvastatin 10 mg # 90 x 3.

## 2012-10-04 ENCOUNTER — Ambulatory Visit: Payer: PRIVATE HEALTH INSURANCE | Admitting: Family Medicine

## 2012-11-22 ENCOUNTER — Encounter: Payer: Self-pay | Admitting: Family Medicine

## 2012-11-24 ENCOUNTER — Encounter: Payer: Self-pay | Admitting: *Deleted

## 2013-08-18 ENCOUNTER — Other Ambulatory Visit: Payer: Self-pay | Admitting: Family Medicine

## 2013-08-18 NOTE — Telephone Encounter (Signed)
Please schedule f/u and refill for a year

## 2013-08-18 NOTE — Telephone Encounter (Signed)
Electronic refill request, pt has not been seen in over a year and no future appts are scheduled. Please advise.

## 2013-08-19 ENCOUNTER — Other Ambulatory Visit: Payer: Self-pay | Admitting: Family Medicine

## 2013-08-19 NOTE — Telephone Encounter (Signed)
Please schedule PE for about 6 mo and refill until then, thanks

## 2013-08-19 NOTE — Telephone Encounter (Signed)
Electronic refill request, last OV was 05/28/2012, please advise

## 2013-08-23 ENCOUNTER — Other Ambulatory Visit: Payer: Self-pay | Admitting: *Deleted

## 2013-08-23 MED ORDER — FEXOFENADINE HCL 180 MG PO TABS: ORAL_TABLET | ORAL | Status: DC

## 2013-08-23 NOTE — Telephone Encounter (Signed)
Patient will make appt for 6 month PE.

## 2014-01-18 ENCOUNTER — Encounter: Payer: Self-pay | Admitting: Family Medicine

## 2014-01-18 ENCOUNTER — Ambulatory Visit (INDEPENDENT_AMBULATORY_CARE_PROVIDER_SITE_OTHER): Payer: PRIVATE HEALTH INSURANCE | Admitting: Family Medicine

## 2014-01-18 VITALS — BP 100/72 | HR 76 | Temp 98.4°F | Ht 64.0 in | Wt 125.5 lb

## 2014-01-18 DIAGNOSIS — Z23 Encounter for immunization: Secondary | ICD-10-CM

## 2014-01-18 DIAGNOSIS — E785 Hyperlipidemia, unspecified: Secondary | ICD-10-CM

## 2014-01-18 DIAGNOSIS — E119 Type 2 diabetes mellitus without complications: Secondary | ICD-10-CM

## 2014-01-18 DIAGNOSIS — Z0000 Encounter for general adult medical examination without abnormal findings: Secondary | ICD-10-CM

## 2014-01-18 DIAGNOSIS — I10 Essential (primary) hypertension: Secondary | ICD-10-CM

## 2014-01-18 MED ORDER — HYDROCHLOROTHIAZIDE 12.5 MG PO CAPS: 12.5000 mg | ORAL_CAPSULE | Freq: Every day | ORAL | Status: DC

## 2014-01-18 MED ORDER — OMEPRAZOLE 20 MG PO CPDR: 20.0000 mg | DELAYED_RELEASE_CAPSULE | Freq: Every day | ORAL | Status: DC | PRN

## 2014-01-18 MED ORDER — METFORMIN HCL 500 MG PO TABS: 500.0000 mg | ORAL_TABLET | Freq: Every day | ORAL | Status: DC

## 2014-01-18 MED ORDER — ATORVASTATIN CALCIUM 10 MG PO TABS: 10.0000 mg | ORAL_TABLET | Freq: Every day | ORAL | Status: DC

## 2014-01-18 NOTE — Patient Instructions (Signed)
prevnar vaccine today Flu vaccine today  Don't forget to schedule your mammogram

## 2014-01-18 NOTE — Progress Notes (Signed)
Pre visit review using our clinic review tool, if applicable. No additional management support is needed unless otherwise documented below in the visit note. 

## 2014-01-18 NOTE — Progress Notes (Signed)
Subjective:    Patient ID: Laurie Tran, female    DOB: 1945/09/05, 68 y.o.   MRN: [id number]  HPI Here for health maintenance exam and to review chronic medical problems    Had a good summer  Traveled up [place] - had a good trip   Wt is down 4 lb  She cut out a lot of sugar -not trying to loose weight  bmi is 21  She feels better -less gerd   ptx 1/14 -- wants to get the prevnar  Mammogram 6/14- will schedule that at [facility] exam -no lumps or changes  Gyn -no problems  Flu vaccine -will get today   Td 1/13  colonosc 2/13  Zoster vaccine 7/13   Labs at [facility]  Glucose is 88 A1C is 6.0 She is watching sugar   Chem profile is normal Iron normal  Cholesterol HDL 56 , LDL 71  Thyroid labs are fine  Cbc  Fine   Patient Active Problem List   Diagnosis Date Noted  . Shingles 11/13/2011  . Routine general medical examination at a health care facility 05/30/2011  . Other screening mammogram 05/30/2011  . Colon polyps 05/30/2011  . DM type 2 (diabetes mellitus, type 2) 10/29/2010  . GERD (gastroesophageal reflux disease) 10/29/2010  . HTN (hypertension) 10/29/2010  . Hyperlipidemia 10/29/2010  . History of kidney stones 10/29/2010  . Perennial allergic rhinitis 10/29/2010   Past Medical History  Diagnosis Date  . History of chicken pox   . Diabetes mellitus   . GERD (gastroesophageal reflux disease)   . Allergy   . Hypertension   . Kidney stone   . History of UTI    Past Surgical History  Procedure Laterality Date  . Lipotripsy    . J basket retrieval      for kidney stones  . Meniscus repair  2006    rt knee  . Breast surgery  1085    breast biopsy benign   History  Substance Use Topics  . Smoking status: Never Smoker   . Smokeless tobacco: Never Used  . Alcohol Use: No   Family History  Problem Relation Age of Onset  . Cancer Mother     breast CA  . Arthritis Paternal Grandmother   . Stroke Paternal Grandmother   . Diabetes Paternal  Grandmother   . Cancer Sister   . Breast cancer Sister    Allergies  Allergen Reactions  . Celebrex [Celecoxib] Hives    And congestion  . Vioxx [Rofecoxib] Hives    And congestion   Current Outpatient Prescriptions on File Prior to Visit  Medication Sig Dispense Refill  . diphenhydrAMINE (BENADRYL) 25 mg capsule Take 25 mg by mouth at bedtime.       Marland Kitchen Fexofenadine HCl (ALLEGRA PO) Take 1 tablet by mouth daily.      . Omega-3 Fatty Acids (FISH OIL) 1000 MG CAPS Take 1,000 mg by mouth daily.       Marland Kitchen VITAMIN D, ERGOCALCIFEROL, PO Take 1 tablet by mouth daily.        No current facility-administered medications on file prior to visit.    Review of Systems Review of Systems  Constitutional: Negative for fever, appetite change, fatigue and unexpected weight change.  Eyes: Negative for pain and visual disturbance.  Respiratory: Negative for cough and shortness of breath.   Cardiovascular: Negative for cp or palpitations    Gastrointestinal: Negative for nausea, diarrhea and constipation.  Genitourinary: Negative for  urgency and frequency.  Skin: Negative for pallor or rash   Neurological: Negative for weakness, light-headedness, numbness and headaches.  Hematological: Negative for adenopathy. Does not bruise/bleed easily.  Psychiatric/Behavioral: Negative for dysphoric mood. The patient is not nervous/anxious.         Objective:   Physical Exam  Constitutional: She appears well-developed and well-nourished. No distress.  HENT:  Head: Normocephalic and atraumatic.  Right Ear: External ear normal.  Left Ear: External ear normal.  Mouth/Throat: Oropharynx is clear and moist.  Eyes: Conjunctivae and EOM are normal. Pupils are equal, round, and reactive to light. No scleral icterus.  Neck: Normal range of motion. Neck supple. No JVD present. Carotid bruit is not present. No thyromegaly present.  Cardiovascular: Normal rate, regular rhythm, normal heart sounds and intact distal  pulses.  Exam reveals no gallop.   Pulmonary/Chest: Effort normal and breath sounds normal. No respiratory distress. She has no wheezes. She exhibits no tenderness.  Abdominal: Soft. Bowel sounds are normal. She exhibits no distension, no abdominal bruit and no mass. There is no tenderness.  Genitourinary: No breast swelling, tenderness, discharge or bleeding.  Breast exam: No mass, nodules, thickening, tenderness, bulging, retraction, inflamation, nipple discharge or skin changes noted.  No axillary or clavicular LA.      Musculoskeletal: Normal range of motion. She exhibits no edema and no tenderness.  Lymphadenopathy:    She has no cervical adenopathy.  Neurological: She is alert. She has normal reflexes. No cranial nerve deficit. She exhibits normal muscle tone. Coordination normal.  Skin: Skin is warm and dry. No rash noted. No erythema. No pallor.  Tanned Some solar lentigos   Psychiatric: She has a normal mood and affect.          Assessment & Plan:   Problem List Items Addressed This Visit     Cardiovascular and Mediastinum   HTN (hypertension)      bp in fair control at this time  BP Readings from Last 1 Encounters:  01/18/14 100/72   No changes needed Disc lifstyle change with low sodium diet and exercise  Labs from [facility] reviewed     Relevant Medications      hydrochlorothiazide (MICROZIDE) 12.5 MG capsule      atorvastatin (LIPITOR) tablet     Endocrine   DM type 2 (diabetes mellitus, type 2)     A1C is 6 Very well controlled with metformin / diet and wt control Commended     Relevant Medications      metFORMIN (GLUCOPHAGE) tablet      atorvastatin (LIPITOR) tablet     Other   Hyperlipidemia     Disc goals for lipids and reasons to control them Rev labs with pt Rev low sat fat diet in detail  Good control with lipitor and diet     Relevant Medications      hydrochlorothiazide (MICROZIDE) 12.5 MG capsule      atorvastatin (LIPITOR) tablet    Routine general medical examination at a health care facility - Primary     Reviewed health habits including diet and exercise and skin cancer prevention Reviewed appropriate screening tests for age  Also reviewed health mt list, fam hx and immunization status , as well as social and family history   See HPI Commended good health habits Labs from [facility] reviewed  prevnar and flu vaccines today Pt will make own mammogram appt      Other Visit Diagnoses   Need for prophylactic vaccination and inoculation  against influenza        Relevant Orders       Flu Vaccine QUAD 36+ mos IM (Completed)    Need for prophylactic vaccination against Streptococcus pneumoniae (pneumococcus)        Relevant Orders       Pneumococcal conjugate vaccine 13-valent (Completed)

## 2014-01-19 NOTE — Assessment & Plan Note (Addendum)
Reviewed health habits including diet and exercise and skin cancer prevention Reviewed appropriate screening tests for age  Also reviewed health mt list, fam hx and immunization status , as well as social and family history   See HPI Commended good health habits Labs from [facility] reviewed  prevnar and flu vaccines today Pt will make own mammogram appt

## 2014-01-19 NOTE — Assessment & Plan Note (Signed)
bp in fair control at this time  BP Readings from Last 1 Encounters:  01/18/14 100/72   No changes needed Disc lifstyle change with low sodium diet and exercise  Labs from [facility] reviewed

## 2014-01-19 NOTE — Assessment & Plan Note (Signed)
Disc goals for lipids and reasons to control them Rev labs with pt Rev low sat fat diet in detail  Good control with lipitor and diet

## 2014-01-19 NOTE — Assessment & Plan Note (Signed)
A1C is 6 Very well controlled with metformin / diet and wt control Commended

## 2014-08-12 LAB — HM MAMMOGRAPHY: HM MAMMO: NORMAL

## 2014-08-15 ENCOUNTER — Encounter: Payer: Self-pay | Admitting: Family Medicine

## 2014-08-16 ENCOUNTER — Telehealth: Payer: Self-pay

## 2014-08-16 NOTE — Telephone Encounter (Signed)
Left message for patient to call office to inform her of normal mammogram.

## 2014-08-16 NOTE — Telephone Encounter (Signed)
-----   Message from Abner Greenspan, MD sent at 08/15/2014  5:46 PM EDT ----- Mammogram is normal  Please note for flow sheet if you can  Due for next screening mammogram in 1 year

## 2014-08-17 ENCOUNTER — Encounter: Payer: Self-pay | Admitting: *Deleted

## 2014-09-26 ENCOUNTER — Other Ambulatory Visit: Payer: Self-pay | Admitting: Family Medicine

## 2014-11-15 ENCOUNTER — Telehealth: Payer: Self-pay

## 2014-11-15 NOTE — Telephone Encounter (Signed)
Diabetic Bundle. Left voicemail advising pt her A1C blood test is due. Pt advised to contact PCP's office to schedule.  

## 2014-11-16 LAB — HM DIABETES EYE EXAM

## 2015-01-22 ENCOUNTER — Other Ambulatory Visit: Payer: Self-pay | Admitting: Family Medicine

## 2015-01-22 NOTE — Telephone Encounter (Signed)
Electronic refill request, pt hasn't been seen in over a year, last appt was CPE on 01/18/14, and no future appt., please advise

## 2015-01-22 NOTE — Telephone Encounter (Signed)
Please schedule f/u and refill until then  

## 2015-01-23 NOTE — Telephone Encounter (Signed)
Left voicemail requesting pt to call office back 

## 2015-01-25 NOTE — Telephone Encounter (Signed)
appt scheduled and med refilled 

## 2015-01-25 NOTE — Telephone Encounter (Signed)
Left 2nd voicemail requesting pt to call office back 

## 2015-01-27 ENCOUNTER — Other Ambulatory Visit: Payer: Self-pay | Admitting: Family Medicine

## 2015-02-16 ENCOUNTER — Encounter: Payer: Self-pay | Admitting: Family Medicine

## 2015-02-16 ENCOUNTER — Ambulatory Visit (INDEPENDENT_AMBULATORY_CARE_PROVIDER_SITE_OTHER): Payer: PRIVATE HEALTH INSURANCE | Admitting: Family Medicine

## 2015-02-16 VITALS — BP 122/76 | HR 70 | Temp 98.1°F | Ht 64.0 in | Wt 126.5 lb

## 2015-02-16 DIAGNOSIS — E785 Hyperlipidemia, unspecified: Secondary | ICD-10-CM

## 2015-02-16 DIAGNOSIS — E119 Type 2 diabetes mellitus without complications: Secondary | ICD-10-CM | POA: Diagnosis not present

## 2015-02-16 DIAGNOSIS — Z23 Encounter for immunization: Secondary | ICD-10-CM | POA: Diagnosis not present

## 2015-02-16 DIAGNOSIS — I10 Essential (primary) hypertension: Secondary | ICD-10-CM | POA: Diagnosis not present

## 2015-02-16 MED ORDER — MOMETASONE FUROATE 50 MCG/ACT NA SUSP: 2.0000 | Freq: Every day | NASAL | Status: DC | PRN

## 2015-02-16 NOTE — Assessment & Plan Note (Signed)
A1C was 6.0 in July- excellent control with metformin and diet  No ace for renal prot due to hx of anaphylaxis with nsaids in the past  opthy exam is up to date Good diet and exercise Continue to follow Flu shot today

## 2015-02-16 NOTE — Progress Notes (Signed)
Pre visit review using our clinic review tool, if applicable. No additional management support is needed unless otherwise documented below in the visit note. 

## 2015-02-16 NOTE — Assessment & Plan Note (Signed)
Very good control with statin and diet  Disc goals for lipids and reasons to control them Rev labs with pt Rev low sat fat diet in detail

## 2015-02-16 NOTE — Assessment & Plan Note (Signed)
bp in fair control at this time  BP Readings from Last 1 Encounters:  02/16/15 122/76   No changes needed Disc lifstyle change with low sodium diet and exercise  Good health habits Rev labs from [facility]/work- all excellent  Enc to keep up good health habits

## 2015-02-16 NOTE — Patient Instructions (Signed)
I'm glad you are doing well  Keep taking care of yourself  Flu shot today

## 2015-02-16 NOTE — Progress Notes (Signed)
Subjective:    Patient ID: Laurie Tran, female    DOB: 03/16/1946, 69 y.o.   MRN: [id number]  HPI Here for f/u of chronic medical problems   Doing well overall   Had a rxn to ibuprofen- breathing problem - now noted all to all nsaids   Wt is up 1 lb   bp is stable today  No cp or palpitations or headaches or edema  No side effects to medicines  BP Readings from Last 3 Encounters:  02/16/15 122/76  01/18/14 100/72  05/28/12 136/72     DM2- thinks her blood sugar has been fine  A1C was 6.0 in July (labs from work rev today)  No ace due to hx of anaphalaxis with nsaids    Cholesterol-excellent with statin and diet  LDL 84 HDL 57 Trig 91   D level 66  Chem, thyroid and cbc all normal   Eats well and gets exercise   Had eye exam 4 mo - no retinopathy  Is starting some mild cataracts   Patient Active Problem List   Diagnosis Date Noted  . Shingles 11/13/2011  . Routine general medical examination at a health care facility 05/30/2011  . Other screening mammogram 05/30/2011  . Colon polyps 05/30/2011  . DM type 2 (diabetes mellitus, type 2) 10/29/2010  . GERD (gastroesophageal reflux disease) 10/29/2010  . HTN (hypertension) 10/29/2010  . Hyperlipidemia 10/29/2010  . History of kidney stones 10/29/2010  . Perennial allergic rhinitis 10/29/2010   Past Medical History  Diagnosis Date  . History of chicken pox   . Diabetes mellitus   . GERD (gastroesophageal reflux disease)   . Allergy   . Hypertension   . Kidney stone   . History of UTI    Past Surgical History  Procedure Laterality Date  . Lipotripsy    . J basket retrieval      for kidney stones  . Meniscus repair  2006    rt knee  . Breast surgery  1085    breast biopsy benign   Social History  Substance Use Topics  . Smoking status: Never Smoker   . Smokeless tobacco: Never Used  . Alcohol Use: No   Family History  Problem Relation Age of Onset  . Cancer Mother     breast CA  .  Arthritis Paternal Grandmother   . Stroke Paternal Grandmother   . Diabetes Paternal Grandmother   . Cancer Sister   . Breast cancer Sister    Allergies  Allergen Reactions  . Nsaids Anaphylaxis    Breathing problems   . Celebrex [Celecoxib] Hives    And congestion  . Vioxx [Rofecoxib] Hives    And congestion  . Ibuprofen Other (See Comments)    Reactive airways    Current Outpatient Prescriptions on File Prior to Visit  Medication Sig Dispense Refill  . atorvastatin (LIPITOR) 10 MG tablet TAKE 1 TABLET (10 MG TOTAL) BY MOUTH DAILY. 90 tablet 0  . Cyanocobalamin (VITAMIN B 12 PO) Take 1 tablet by mouth daily.    . diphenhydrAMINE (BENADRYL) 25 mg capsule Take 25 mg by mouth at bedtime.     . fexofenadine (ALLEGRA) 180 MG tablet TAKE 1 TABLET (180 MG TOTAL) BY MOUTH DAILY. 30 tablet 5  . Fexofenadine HCl (ALLEGRA PO) Take 1 tablet by mouth daily.    . hydrochlorothiazide (MICROZIDE) 12.5 MG capsule TAKE 1 CAPSULE (12.5 MG TOTAL) BY MOUTH DAILY. 90 capsule 0  . metFORMIN (GLUCOPHAGE) 500 MG  tablet TAKE 1 TABLET (500 MG TOTAL) BY MOUTH DAILY WITH BREAKFAST. 90 tablet 0  . Omega-3 Fatty Acids (FISH OIL) 1000 MG CAPS Take 1,000 mg by mouth daily.     Marland Kitchen omeprazole (PRILOSEC) 20 MG capsule TAKE 1 CAPSULE (20 MG TOTAL) BY MOUTH DAILY AS NEEDED. 90 capsule 0  . VITAMIN D, ERGOCALCIFEROL, PO Take 1 tablet by mouth daily.      No current facility-administered medications on file prior to visit.    Review of Systems Review of Systems  Constitutional: Negative for fever, appetite change, fatigue and unexpected weight change.  Eyes: Negative for pain and visual disturbance.  Respiratory: Negative for cough and shortness of breath.   Cardiovascular: Negative for cp or palpitations    Gastrointestinal: Negative for nausea, diarrhea and constipation.  Genitourinary: Negative for urgency and frequency.  Skin: Negative for pallor or rash   Neurological: Negative for weakness, light-headedness,  numbness and headaches.  Hematological: Negative for adenopathy. Does not bruise/bleed easily.  Psychiatric/Behavioral: Negative for dysphoric mood. The patient is not nervous/anxious.         Objective:   Physical Exam  Constitutional: She appears well-developed and well-nourished. No distress.  Well appearing   HENT:  Head: Normocephalic and atraumatic.  Mouth/Throat: Oropharynx is clear and moist.  Some clear rhinorrhea and post nasal drip   Eyes: Conjunctivae and EOM are normal. Pupils are equal, round, and reactive to light.  Neck: Normal range of motion. Neck supple. No JVD present. Carotid bruit is not present. No thyromegaly present.  Cardiovascular: Normal rate, regular rhythm, normal heart sounds and intact distal pulses.  Exam reveals no gallop.   Pulmonary/Chest: Effort normal and breath sounds normal. No respiratory distress. She has no wheezes. She has no rales.  No crackles  Abdominal: Soft. Bowel sounds are normal. She exhibits no distension, no abdominal bruit and no mass. There is no tenderness.  Musculoskeletal: She exhibits no edema.  Lymphadenopathy:    She has no cervical adenopathy.  Neurological: She is alert. She has normal reflexes.  Skin: Skin is warm and dry. No rash noted.  Psychiatric: She has a normal mood and affect.  Nursing note and vitals reviewed.         Assessment & Plan:   Problem List Items Addressed This Visit      Cardiovascular and Mediastinum   HTN (hypertension) - Primary    bp in fair control at this time  BP Readings from Last 1 Encounters:  02/16/15 122/76   No changes needed Disc lifstyle change with low sodium diet and exercise  Good health habits Rev labs from [facility]/work- all excellent  Enc to keep up good health habits         Endocrine   DM type 2 (diabetes mellitus, type 2)    A1C was 6.0 in July- excellent control with metformin and diet  No ace for renal prot due to hx of anaphylaxis with nsaids in the  past  opthy exam is up to date Good diet and exercise Continue to follow Flu shot today        Other   Hyperlipidemia    Very good control with statin and diet  Disc goals for lipids and reasons to control them Rev labs with pt Rev low sat fat diet in detail        Other Visit Diagnoses    Need for influenza vaccination        Relevant Orders  Flu Vaccine QUAD 36+ mos PF IM (Fluarix & Fluzone Quad PF) (Completed)

## 2015-05-07 ENCOUNTER — Other Ambulatory Visit: Payer: Self-pay | Admitting: Family Medicine

## 2015-05-17 ENCOUNTER — Other Ambulatory Visit: Payer: Self-pay | Admitting: Family Medicine

## 2015-07-11 ENCOUNTER — Other Ambulatory Visit: Payer: Self-pay | Admitting: Family Medicine

## 2015-07-12 LAB — VITAMIN D 25 HYDROXY (VIT D DEFICIENCY, FRACTURES): VIT D 25 HYDROXY: 59.4 ng/mL (ref 30.0–100.0)

## 2015-07-12 LAB — CMP12+LP+TP+TSH+6AC+CBC/D/PLT
ALK PHOS: 105 IU/L (ref 39–117)
ALT: 17 IU/L (ref 0–32)
AST: 20 IU/L (ref 0–40)
Albumin/Globulin Ratio: 1.9 (ref 1.1–2.5)
Albumin: 4.4 g/dL (ref 3.6–4.8)
BASOS: 0 %
BILIRUBIN TOTAL: 0.3 mg/dL (ref 0.0–1.2)
BUN / CREAT RATIO: 31 — AB (ref 11–26)
BUN: 22 mg/dL (ref 8–27)
Basophils Absolute: 0 10*3/uL (ref 0.0–0.2)
CHOL/HDL RATIO: 3 ratio (ref 0.0–4.4)
Calcium: 9.5 mg/dL (ref 8.7–10.3)
Chloride: 101 mmol/L (ref 96–106)
Cholesterol, Total: 169 mg/dL (ref 100–199)
Creatinine, Ser: 0.72 mg/dL (ref 0.57–1.00)
EOS (ABSOLUTE): 0.2 10*3/uL (ref 0.0–0.4)
EOS: 3 %
FREE THYROXINE INDEX: 2.6 (ref 1.2–4.9)
GFR calc Af Amer: 100 mL/min/{1.73_m2} (ref >59)
GFR calc non Af Amer: 87 mL/min/{1.73_m2} (ref >59)
GGT: 4 IU/L (ref 0–60)
GLUCOSE: 104 mg/dL — AB (ref 65–99)
Globulin, Total: 2.3 g/dL (ref 1.5–4.5)
HDL: 56 mg/dL (ref >39)
HEMATOCRIT: 37.5 % (ref 34.0–46.6)
HEMOGLOBIN: 12.6 g/dL (ref 11.1–15.9)
IMMATURE GRANS (ABS): 0 10*3/uL (ref 0.0–0.1)
IMMATURE GRANULOCYTES: 0 %
Iron: 66 ug/dL (ref 27–139)
LDH: 179 IU/L (ref 119–226)
LDL CALC: 99 mg/dL (ref 0–99)
LYMPHS ABS: 2.1 10*3/uL (ref 0.7–3.1)
Lymphs: 26 %
MCH: 28.9 pg (ref 26.6–33.0)
MCHC: 33.6 g/dL (ref 31.5–35.7)
MCV: 86 fL (ref 79–97)
Monocytes Absolute: 0.4 10*3/uL (ref 0.1–0.9)
Monocytes: 5 %
NEUTROS PCT: 66 %
Neutrophils Absolute: 5.4 10*3/uL (ref 1.4–7.0)
Phosphorus: 3.1 mg/dL (ref 2.5–4.5)
Platelets: 251 10*3/uL (ref 150–379)
Potassium: 4.3 mmol/L (ref 3.5–5.2)
RBC: 4.36 x10E6/uL (ref 3.77–5.28)
RDW: 13.5 % (ref 12.3–15.4)
SODIUM: 143 mmol/L (ref 134–144)
T3 Uptake Ratio: 26 % (ref 24–39)
T4, Total: 10.1 ug/dL (ref 4.5–12.0)
TSH: 3.93 u[IU]/mL (ref 0.450–4.500)
Total Protein: 6.7 g/dL (ref 6.0–8.5)
Triglycerides: 69 mg/dL (ref 0–149)
URIC ACID: 5.1 mg/dL (ref 2.5–7.1)
VLDL CHOLESTEROL CAL: 14 mg/dL (ref 5–40)
WBC: 8.2 10*3/uL (ref 3.4–10.8)

## 2015-07-12 LAB — HGB A1C W/O EAG: Hgb A1c MFr Bld: 6 % — ABNORMAL HIGH (ref 4.8–5.6)

## 2015-10-13 LAB — HM MAMMOGRAPHY

## 2015-10-16 ENCOUNTER — Encounter: Payer: Self-pay | Admitting: Family Medicine

## 2015-10-17 ENCOUNTER — Encounter: Payer: Self-pay | Admitting: *Deleted

## 2015-10-30 ENCOUNTER — Other Ambulatory Visit: Payer: Self-pay | Admitting: Family Medicine

## 2015-11-08 ENCOUNTER — Other Ambulatory Visit: Payer: Self-pay | Admitting: Family Medicine

## 2015-11-13 ENCOUNTER — Other Ambulatory Visit: Payer: Self-pay | Admitting: Family Medicine

## 2015-11-13 NOTE — Telephone Encounter (Signed)
Please schedule f/u in Sept and refill until then

## 2015-11-13 NOTE — Telephone Encounter (Signed)
Electronic refill request no recent/future appts., please advise

## 2015-11-13 NOTE — Telephone Encounter (Signed)
appt scheduled and med refill 

## 2015-12-28 ENCOUNTER — Other Ambulatory Visit: Payer: Self-pay | Admitting: Family Medicine

## 2015-12-29 LAB — CMP12+LP+TP+TSH+6AC+CBC/D/PLT
ALK PHOS: 111 IU/L (ref 39–117)
ALT: 15 IU/L (ref 0–32)
AST: 20 IU/L (ref 0–40)
Albumin/Globulin Ratio: 1.9 (ref 1.2–2.2)
Albumin: 4.5 g/dL (ref 3.6–4.8)
BASOS ABS: 0 10*3/uL (ref 0.0–0.2)
BASOS: 0 %
BILIRUBIN TOTAL: 0.5 mg/dL (ref 0.0–1.2)
BUN / CREAT RATIO: 19 (ref 12–28)
BUN: 14 mg/dL (ref 8–27)
CHLORIDE: 99 mmol/L (ref 96–106)
CHOL/HDL RATIO: 2.9 ratio (ref 0.0–4.4)
CREATININE: 0.75 mg/dL (ref 0.57–1.00)
Calcium: 9.5 mg/dL (ref 8.7–10.3)
Cholesterol, Total: 155 mg/dL (ref 100–199)
EOS (ABSOLUTE): 0.2 10*3/uL (ref 0.0–0.4)
EOS: 2 %
Free Thyroxine Index: 2 (ref 1.2–4.9)
GFR, EST AFRICAN AMERICAN: 95 mL/min/{1.73_m2} (ref >59)
GFR, EST NON AFRICAN AMERICAN: 83 mL/min/{1.73_m2} (ref >59)
GGT: 5 IU/L (ref 0–60)
GLUCOSE: 88 mg/dL (ref 65–99)
Globulin, Total: 2.4 g/dL (ref 1.5–4.5)
HDL: 54 mg/dL (ref >39)
HEMATOCRIT: 37.4 % (ref 34.0–46.6)
HEMOGLOBIN: 12.6 g/dL (ref 11.1–15.9)
IMMATURE GRANULOCYTES: 0 %
IRON: 72 ug/dL (ref 27–139)
Immature Grans (Abs): 0 10*3/uL (ref 0.0–0.1)
LDH: 197 IU/L (ref 119–226)
LDL CALC: 80 mg/dL (ref 0–99)
LYMPHS ABS: 2.4 10*3/uL (ref 0.7–3.1)
Lymphs: 23 %
MCH: 28.9 pg (ref 26.6–33.0)
MCHC: 33.7 g/dL (ref 31.5–35.7)
MCV: 86 fL (ref 79–97)
MONOCYTES: 5 %
Monocytes Absolute: 0.5 10*3/uL (ref 0.1–0.9)
Neutrophils Absolute: 7.3 10*3/uL — ABNORMAL HIGH (ref 1.4–7.0)
Neutrophils: 70 %
Phosphorus: 3.3 mg/dL (ref 2.5–4.5)
Platelets: 239 10*3/uL (ref 150–379)
Potassium: 4.1 mmol/L (ref 3.5–5.2)
RBC: 4.36 x10E6/uL (ref 3.77–5.28)
RDW: 13.2 % (ref 12.3–15.4)
SODIUM: 142 mmol/L (ref 134–144)
T3 Uptake Ratio: 24 % (ref 24–39)
T4, Total: 8.3 ug/dL (ref 4.5–12.0)
TOTAL PROTEIN: 6.9 g/dL (ref 6.0–8.5)
TSH: 2.9 u[IU]/mL (ref 0.450–4.500)
Triglycerides: 104 mg/dL (ref 0–149)
URIC ACID: 4.2 mg/dL (ref 2.5–7.1)
VLDL CHOLESTEROL CAL: 21 mg/dL (ref 5–40)
WBC: 10.4 10*3/uL (ref 3.4–10.8)

## 2015-12-29 LAB — HGB A1C W/O EAG: HEMOGLOBIN A1C: 5.9 % — AB (ref 4.8–5.6)

## 2015-12-29 LAB — VITAMIN D 25 HYDROXY (VIT D DEFICIENCY, FRACTURES): Vit D, 25-Hydroxy: 68.2 ng/mL (ref 30.0–100.0)

## 2016-01-10 ENCOUNTER — Other Ambulatory Visit: Payer: Self-pay | Admitting: Family Medicine

## 2016-02-08 ENCOUNTER — Ambulatory Visit (INDEPENDENT_AMBULATORY_CARE_PROVIDER_SITE_OTHER): Payer: PRIVATE HEALTH INSURANCE | Admitting: Family Medicine

## 2016-02-08 ENCOUNTER — Ambulatory Visit (INDEPENDENT_AMBULATORY_CARE_PROVIDER_SITE_OTHER)
Admission: RE | Admit: 2016-02-08 | Discharge: 2016-02-08 | Disposition: A | Discharge status: Home / Self Care | Payer: PRIVATE HEALTH INSURANCE | Source: Ambulatory Visit | Attending: Family Medicine | Admitting: Family Medicine

## 2016-02-08 ENCOUNTER — Other Ambulatory Visit: Payer: Self-pay | Admitting: Family Medicine

## 2016-02-08 ENCOUNTER — Encounter: Payer: Self-pay | Admitting: Family Medicine

## 2016-02-08 VITALS — BP 132/84 | HR 73 | Temp 98.7°F | Ht 64.0 in | Wt 125.5 lb

## 2016-02-08 DIAGNOSIS — I10 Essential (primary) hypertension: Secondary | ICD-10-CM

## 2016-02-08 DIAGNOSIS — E785 Hyperlipidemia, unspecified: Secondary | ICD-10-CM

## 2016-02-08 DIAGNOSIS — M79671 Pain in right foot: Secondary | ICD-10-CM

## 2016-02-08 DIAGNOSIS — L298 Other pruritus: Secondary | ICD-10-CM | POA: Diagnosis not present

## 2016-02-08 DIAGNOSIS — E119 Type 2 diabetes mellitus without complications: Secondary | ICD-10-CM | POA: Diagnosis not present

## 2016-02-08 DIAGNOSIS — Z23 Encounter for immunization: Secondary | ICD-10-CM | POA: Diagnosis not present

## 2016-02-08 DIAGNOSIS — N898 Other specified noninflammatory disorders of vagina: Secondary | ICD-10-CM

## 2016-02-08 MED ORDER — FEXOFENADINE HCL 180 MG PO TABS: ORAL_TABLET | ORAL | 3 refills | Status: DC

## 2016-02-08 MED ORDER — METFORMIN HCL 500 MG PO TABS: ORAL_TABLET | ORAL | 3 refills | Status: DC

## 2016-02-08 MED ORDER — FLUCONAZOLE 150 MG PO TABS: ORAL_TABLET | ORAL | 0 refills | Status: DC

## 2016-02-08 MED ORDER — ATORVASTATIN CALCIUM 10 MG PO TABS: 10.0000 mg | ORAL_TABLET | Freq: Every day | ORAL | 3 refills | Status: DC

## 2016-02-08 MED ORDER — HYDROCHLOROTHIAZIDE 12.5 MG PO CAPS: ORAL_CAPSULE | ORAL | 3 refills | Status: DC

## 2016-02-08 MED ORDER — MOMETASONE FUROATE 50 MCG/ACT NA SUSP: 2.0000 | Freq: Every day | NASAL | 11 refills | Status: DC | PRN

## 2016-02-08 NOTE — Progress Notes (Signed)
Pre visit review using our clinic review tool, if applicable. No additional management support is needed unless otherwise documented below in the visit note. 

## 2016-02-08 NOTE — Patient Instructions (Signed)
Xray of of foot today  Flu shot today  Take the diflucan for suspected yeast for 3 doses  Stop at check out for referral to gyn as well

## 2016-02-08 NOTE — Progress Notes (Signed)
Subjective:    Patient ID: Laurie Tran, female    DOB: 1946/05/08, 70 y.o.   MRN: [id number]  HPI Here for f/u of chronic medical problems   Wt Readings from Last 3 Encounters:  02/08/16 125 lb 8 oz (56.9 kg)  02/16/15 126 lb 8 oz (57.4 kg)  01/18/14 125 lb 8 oz (56.9 kg)  taking care of herslef  bmi is 21.5  Also acute problems- vaginal itch and R foot pain   bp is stable today - she did take her medicine  They have been good - 128/84 out of office  No cp or palpitations or headaches or edema  No side effects to medicines  BP Readings from Last 3 Encounters:  02/08/16 132/84  02/16/15 122/76  01/18/14 100/72       Chemistry      Component Value Date/Time   NA 142 12/28/2015 0000   K 4.1 12/28/2015 0000   CL 99 12/28/2015 0000   CO2 27 05/28/2012 1047   BUN 14 12/28/2015 0000   CREATININE 0.75 12/28/2015 0000      Component Value Date/Time   CALCIUM 9.5 12/28/2015 0000   ALKPHOS 111 12/28/2015 0000   AST 20 12/28/2015 0000   ALT 15 12/28/2015 0000   BILITOT 0.5 12/28/2015 0000         Hx of DM2 Well controlled with metformin and diet  Lab Results  Component Value Date   HGBA1C 5.9 (H) 12/28/2015  is taking chromium as well  Cannot have ace due to hx of anaphylaxis with nsaids  Is eating well  Uses elliptical machine and gardens and walks    Hx of hyperlipidemia Lab Results  Component Value Date   CHOL 155 12/28/2015   CHOL 169 07/11/2015   CHOL 172 05/28/2012   Lab Results  Component Value Date   HDL 54 12/28/2015   HDL 56 07/11/2015   HDL 48 05/28/2012   Lab Results  Component Value Date   LDLCALC 80 12/28/2015   LDLCALC 99 07/11/2015   LDLCALC 103 (H) 05/28/2012   Lab Results  Component Value Date   TRIG 104 12/28/2015   TRIG 69 07/11/2015   TRIG 106 05/28/2012   Lab Results  Component Value Date   CHOLHDL 2.9 12/28/2015   CHOLHDL 3.0 07/11/2015   CHOLHDL 3.6 05/28/2012   No results found for: LDLDIRECT  GERD- she cut  out sodas and now does not need PPI as often     Vaginal itching - going on for a long time (started with some travel when she was floating down a river)- about a year ago  She has bought vaginal anti itch products and one yeast infection treatment  No discharge Very dry vaginal mucosa - intercourse is painful so she is not active  No pelvic pain   Yeast buds and wet prep today   R foot hurts = thinks ? Arthritis in 2nd toe and top of her foot and ball of her foot  No trauma  No swelling/ bruising or skin change    Patient Active Problem List   Diagnosis Date Noted  . Vaginal itching 02/08/2016  . Right foot pain 02/08/2016  . Shingles 11/13/2011  . Routine general medical examination at a health care facility 05/30/2011  . Other screening mammogram 05/30/2011  . Colon polyps 05/30/2011  . DM type 2 (diabetes mellitus, type 2) ([facility]) 10/29/2010  . GERD (gastroesophageal reflux disease) 10/29/2010  . HTN (hypertension) 10/29/2010  .  Hyperlipidemia 10/29/2010  . History of kidney stones 10/29/2010  . Perennial allergic rhinitis 10/29/2010   Past Medical History:  Diagnosis Date  . Allergy   . Diabetes mellitus   . GERD (gastroesophageal reflux disease)   . History of chicken pox   . History of UTI   . Hypertension   . Kidney stone    Past Surgical History:  Procedure Laterality Date  . BREAST SURGERY  1085   breast biopsy benign  . j basket retrieval     for kidney stones  . lipotripsy    . MENISCUS REPAIR  2006   rt knee   Social History  Substance Use Topics  . Smoking status: Never Smoker  . Smokeless tobacco: Never Used  . Alcohol use No   Family History  Problem Relation Age of Onset  . Cancer Mother     breast CA  . Arthritis Paternal Grandmother   . Stroke Paternal Grandmother   . Diabetes Paternal Grandmother   . Cancer Sister   . Breast cancer Sister    Allergies  Allergen Reactions  . Nsaids Anaphylaxis    Breathing problems   . Celebrex  [Celecoxib] Hives    And congestion  . Vioxx [Rofecoxib] Hives    And congestion  . Ibuprofen Other (See Comments)    Reactive airways    Current Outpatient Prescriptions on File Prior to Visit  Medication Sig Dispense Refill  . Cyanocobalamin (VITAMIN B 12 PO) Take 1 tablet by mouth daily.    . diphenhydrAMINE (BENADRYL) 25 mg capsule Take 25 mg by mouth at bedtime.     . Omega-3 Fatty Acids (FISH OIL) 1000 MG CAPS Take 1,000 mg by mouth daily.     Marland Kitchen VITAMIN D, ERGOCALCIFEROL, PO Take 1 tablet by mouth daily.      No current facility-administered medications on file prior to visit.     Review of Systems    Review of Systems  Constitutional: Negative for fever, appetite change, fatigue and unexpected weight change.  Eyes: Negative for pain and visual disturbance.  Respiratory: Negative for cough and shortness of breath.   Cardiovascular: Negative for cp or palpitations    Gastrointestinal: Negative for nausea, diarrhea and constipation.  Genitourinary: Negative for urgency and frequency. Pos for vaginal itching w/o discharge  Skin: Negative for pallor or rash   MSK pos for R foot pain w/o swelling Neurological: Negative for weakness, light-headedness, numbness and headaches.  Hematological: Negative for adenopathy. Does not bruise/bleed easily.  Psychiatric/Behavioral: Negative for dysphoric mood. The patient is not nervous/anxious.      Objective:   Physical Exam  Constitutional: She appears well-developed and well-nourished. No distress.  Well appearing   HENT:  Head: Normocephalic and atraumatic.  Mouth/Throat: Oropharynx is clear and moist.  Eyes: Conjunctivae and EOM are normal. Pupils are equal, round, and reactive to light.  Neck: Normal range of motion. Neck supple. No JVD present. Carotid bruit is not present. No thyromegaly present.  Cardiovascular: Normal rate, regular rhythm, normal heart sounds and intact distal pulses.  Exam reveals no gallop.     Pulmonary/Chest: Effort normal and breath sounds normal. No respiratory distress. She has no wheezes. She has no rales.  No crackles  Abdominal: Soft. Bowel sounds are normal. She exhibits no distension, no abdominal bruit and no mass. There is no tenderness.  No suprapubic tenderness or fullness    Genitourinary:  Genitourinary Comments: Some pale discoloration of labia minora w/o tenderness No vag d/c  Wet prep obtained  Musculoskeletal: She exhibits tenderness. She exhibits no edema.  Tender over base of 2nd toe and 2nd metatarsal dorsally Nl rom of foot and toes No swelling or skin change Well innervated and perfused  Nl nails    Lymphadenopathy:    She has no cervical adenopathy.  Neurological: She is alert. She has normal reflexes.  Skin: Skin is warm and dry. No rash noted. No pallor.  Psychiatric: She has a normal mood and affect.          Assessment & Plan:   Problem List Items Addressed This Visit      Cardiovascular and Mediastinum   HTN (hypertension) - Primary    bp in fair control at this time  BP Readings from Last 1 Encounters:  02/08/16 132/84   No changes needed Disc lifstyle change with low sodium diet and exercise  Labs reviewed  Enc exercise /healthy lifestyle      Relevant Medications   hydrochlorothiazide (MICROZIDE) 12.5 MG capsule   atorvastatin (LIPITOR) 10 MG tablet     Endocrine   DM type 2 (diabetes mellitus, type 2) ([facility])    Lab Results  Component Value Date   HGBA1C 5.9 (H) 12/28/2015   This is very well controlled with metformin and diet  No ace due to anaphylaxis with another medicine       Relevant Medications   metFORMIN (GLUCOPHAGE) 500 MG tablet   atorvastatin (LIPITOR) 10 MG tablet     Musculoskeletal and Integument   Vaginal itching    Yeast buds on wet prep  Some pale areas on external vulvar exam - ? If poss early lichen sclerosis or just atrophy tx with diflucan- 3 doses in 9 days  Ref to gyn for further eval  as well       Relevant Orders   Ambulatory referral to Gynecology   POCT Wet Prep [facility])     Other   Right foot pain    Xray today  Re assuring exam  Pain is primarily dorsal around 2nd toe R/o stress fx ? If poss neuroma       Relevant Orders   DG Foot Complete Right (Completed)   Hyperlipidemia    Disc goals for lipids and reasons to control them Rev labs with pt Rev low sat fat diet in detail Continue atorvastatin and diet- at goal       Relevant Medications   hydrochlorothiazide (MICROZIDE) 12.5 MG capsule   atorvastatin (LIPITOR) 10 MG tablet    Other Visit Diagnoses    Need for influenza vaccination       Relevant Orders   Flu Vaccine QUAD 36+ mos IM (Completed)

## 2016-02-10 ENCOUNTER — Telehealth: Payer: Self-pay | Admitting: Family Medicine

## 2016-02-10 DIAGNOSIS — M79671 Pain in right foot: Secondary | ICD-10-CM

## 2016-02-10 LAB — POCT WET PREP (WET MOUNT)
KOH Wet Prep POC: NEGATIVE
Trichomonas Wet Prep HPF POC: ABSENT

## 2016-02-10 NOTE — Assessment & Plan Note (Signed)
bp in fair control at this time  BP Readings from Last 1 Encounters:  02/08/16 132/84   No changes needed Disc lifstyle change with low sodium diet and exercise  Labs reviewed  Enc exercise /healthy lifestyle

## 2016-02-10 NOTE — Telephone Encounter (Signed)
Ref to podiatry for foot pain

## 2016-02-10 NOTE — Assessment & Plan Note (Signed)
Yeast buds on wet prep  Some pale areas on external vulvar exam - ? If poss early lichen sclerosis or just atrophy tx with diflucan- 3 doses in 9 days  Ref to gyn for further eval as well

## 2016-02-10 NOTE — Assessment & Plan Note (Signed)
Lab Results  Component Value Date   HGBA1C 5.9 (H) 12/28/2015   This is very well controlled with metformin and diet  No ace due to anaphylaxis with another medicine

## 2016-02-10 NOTE — Telephone Encounter (Signed)
-----   Message from Tammi Sou, [place] sent at 02/08/2016  4:55 PM EDT ----- Pt notified of xray results and Dr. Marliss Coots comments, pt agrees with referral to podiatrist and I advise pt our [facility] will call to schedule appt., please put referral in

## 2016-02-10 NOTE — Assessment & Plan Note (Signed)
Xray today  Re assuring exam  Pain is primarily dorsal around 2nd toe R/o stress fx ? If poss neuroma

## 2016-02-10 NOTE — Assessment & Plan Note (Signed)
Disc goals for lipids and reasons to control them Rev labs with pt Rev low sat fat diet in detail Continue atorvastatin and diet- at goal

## 2016-02-11 ENCOUNTER — Other Ambulatory Visit: Payer: Self-pay | Admitting: Family Medicine

## 2016-02-18 ENCOUNTER — Other Ambulatory Visit: Payer: Self-pay | Admitting: Family Medicine

## 2016-02-25 ENCOUNTER — Ambulatory Visit (INDEPENDENT_AMBULATORY_CARE_PROVIDER_SITE_OTHER): Payer: PRIVATE HEALTH INSURANCE | Admitting: Obstetrics and Gynecology

## 2016-02-25 ENCOUNTER — Encounter: Payer: Self-pay | Admitting: Obstetrics and Gynecology

## 2016-02-25 VITALS — BP 142/82 | HR 83 | Resp 18 | Ht 64.0 in | Wt 125.6 lb

## 2016-02-25 DIAGNOSIS — N760 Acute vaginitis: Secondary | ICD-10-CM

## 2016-02-25 NOTE — Progress Notes (Signed)
Pt here today c/o chronic vaginal itching and irritation as well as vaginal dryness.

## 2016-02-25 NOTE — Progress Notes (Signed)
Patient ID: Laurie Tran, female   DOB: 1945/07/06, 70 y.o.   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 Patient states her symptoms are worst in the evening. She denies any abnormal discharge. She was recently treated for a yeast infection without improvement in her symptoms. She denies any abnormal vaginal bleeding  Past Medical History:  Diagnosis Date  . Allergy   . Diabetes mellitus   . GERD (gastroesophageal reflux disease)   . History of chicken pox   . History of UTI   . Hypertension   . Kidney stone    Past Surgical History:  Procedure Laterality Date  . BREAST SURGERY  1085   breast biopsy benign  . j basket retrieval     for kidney stones  . lipotripsy    . MENISCUS REPAIR  2006   rt knee   Family History  Problem Relation Age of Onset  . Cancer Mother     breast CA  . Arthritis Paternal Grandmother   . Stroke Paternal Grandmother   . Diabetes Paternal Grandmother   . Cancer Sister   . Breast cancer Sister    Social History  Substance Use Topics  . Smoking status: Never Smoker  . Smokeless tobacco: Never Used  . Alcohol use No   ROS See pertinent in HPI  Blood pressure (!) 142/82, pulse 83, resp. rate 18, height 5\' 4"  (1.626 m), weight 125 lb 9.6 oz (57 kg). GENERAL: Well-developed, well-nourished female in no acute distress.  ABDOMEN: Soft, nontender, nondistended. No organomegaly. PELVIC: Normal external female genitalia. Labial majora bilaterally are thin with a pale area close to labia minora which is very atrophic. Vagina is pale.  Normal discharge. Normal appearing cervix. Uterus is normal in size. No adnexal mass or tenderness. EXTREMITIES: No cyanosis, clubbing, or edema, 2+ distal pulses.  A/P 70 yo with vulvovaginits - Wet prep collected - Biopsy of the left labia majora performed with a punch biopsy following injection of 1 cc of 1%lidocaine. Patient tolerated the procedure well  and will be contacted with results

## 2016-02-25 NOTE — Patient Instructions (Signed)
Lichen Sclerosus Lichen sclerosus is a skin problem. It can happen on any part of the body, but it commonly involves the anal or genital areas. It can cause itching and discomfort in these areas. Treatment can help to control symptoms. When the genital area is affected, getting treatment is important because the condition can cause scarring that may lead to other problems. CAUSES The cause of this condition is not known. It could be the result of an overactive immune system or a lack of certain hormones. Lichen sclerosus is not an infection or a fungus. It is not passed from one person to another (not contagious). RISK FACTORS This condition is more likely to develop in women, usually after menopause. SYMPTOMS Symptoms of this condition include:  Thin, wrinkled, white areas on the skin.  Thickened white areas on the skin.  Red and swollen patches (lesions) on the skin.  Tears or cracks in the skin.  Bruising.  Blood blisters.  Severe itching. You may also have pain, itching, or burning with urination. Constipation is also common in people with lichen sclerosus. DIAGNOSIS This condition may be diagnosed with a physical exam. In some cases, a tissue sample (biopsy sample) may be removed to be looked at under a microscope. TREATMENT This condition is usually treated with medicated creams or ointments (topical steroids) that are applied over the affected areas. HOME CARE INSTRUCTIONS  Take over-the-counter and prescription medicines only as told by your health care provider.  Use creams or ointments as told by your health care provider.  Do not scratch the affected areas of skin.  Women should keep the vaginal area as clean and dry as possible.  Keep all follow-up visits as told by your health care provider. This is important. SEEK MEDICAL CARE IF:  You have increasing redness, swelling, or pain in the affected area.  You have fluid, blood, or pus coming from the affected  area.  You have new lesions on your skin.  You have pain or burning with urination.  You have pain during sex.   This information is not intended to replace advice given to you by your health care provider. Make sure you discuss any questions you have with your health care provider.   Document Released: 10/02/2010 Document Revised: 01/31/2015 Document Reviewed: 08/07/2014 Elsevier Interactive Patient Education 2016 [employer]  

## 2016-02-26 ENCOUNTER — Telehealth: Payer: Self-pay | Admitting: *Deleted

## 2016-02-26 ENCOUNTER — Encounter: Payer: Self-pay | Admitting: *Deleted

## 2016-02-26 DIAGNOSIS — N760 Acute vaginitis: Principal | ICD-10-CM

## 2016-02-26 DIAGNOSIS — B9689 Other specified bacterial agents as the cause of diseases classified elsewhere: Secondary | ICD-10-CM

## 2016-02-26 LAB — WET PREP, GENITAL
TRICH WET PREP: NONE SEEN
YEAST WET PREP: NONE SEEN

## 2016-02-26 MED ORDER — FLUCONAZOLE 150 MG PO TABS: 150.0000 mg | ORAL_TABLET | Freq: Once | ORAL | 0 refills | Status: AC

## 2016-02-26 MED ORDER — CLOBETASOL PROPIONATE 0.05 % EX OINT: TOPICAL_OINTMENT | CUTANEOUS | 1 refills | Status: DC

## 2016-02-26 MED ORDER — METRONIDAZOLE 500 MG PO TABS: 500.0000 mg | ORAL_TABLET | Freq: Two times a day (BID) | ORAL | 0 refills | Status: AC

## 2016-02-26 NOTE — Telephone Encounter (Signed)
-----   Message from Mora Bellman, MD sent at 02/26/2016  2:36 PM EDT ----- Please inform patient of BV infection. Rx e-prescribed. Biopsy results still pending  Thanks  Peggy

## 2016-02-26 NOTE — Telephone Encounter (Signed)
Called pt, no answer, left message to call the office.  

## 2016-02-26 NOTE — Telephone Encounter (Signed)
Pt returned call in regards to results.  Informed pt of wet prep result and instructed on medication use.  Also sent Diflucan to pharmacy, pt states that she will usually get a yeast infection related to antibiotic use.

## 2016-02-26 NOTE — Addendum Note (Signed)
Addended by: Mora Bellman on: 02/26/2016 06:33 PM   Modules accepted: Orders

## 2016-02-26 NOTE — Addendum Note (Signed)
Addended by: Mora Bellman on: 02/26/2016 02:36 PM   Modules accepted: Orders

## 2016-02-27 ENCOUNTER — Telehealth: Payer: Self-pay | Admitting: *Deleted

## 2016-02-27 NOTE — Telephone Encounter (Signed)
-----   Message from Mora Bellman, MD sent at 02/26/2016  6:33 PM EDT ----- Please inform patient of biopsy results consistent with Lichen slerosis (i gave her information to read on it as part of her AVS). A prescription for clobetasol ointment has been e-prescribed. She will start to see improvement in her symptoms gradually over the next week or two. She should continue treatment even after resolution of her symptoms to prevent recurrence. I would like to see her again in 6-12 weeks  Thanks  Vickii Chafe

## 2016-02-27 NOTE — Telephone Encounter (Signed)
Called pt, no answer, left message to call the office.  

## 2016-02-28 ENCOUNTER — Telehealth: Payer: Self-pay | Admitting: *Deleted

## 2016-02-28 NOTE — Telephone Encounter (Signed)
Informed pt of biopsy result and instructed pt on medication use.  Scheduled follow-up for 04-15-16 with Dr Elly Modena.

## 2016-02-28 NOTE — Telephone Encounter (Signed)
-----   Message from Mora Bellman, MD sent at 02/26/2016  6:33 PM EDT ----- Please inform patient of biopsy results consistent with Lichen slerosis (i gave her information to read on it as part of her AVS). A prescription for clobetasol ointment has been e-prescribed. She will start to see improvement in her symptoms gradually over the next week or two. She should continue treatment even after resolution of her symptoms to prevent recurrence. I would like to see her again in 6-12 weeks  Thanks  Vickii Chafe

## 2016-03-04 ENCOUNTER — Ambulatory Visit (INDEPENDENT_AMBULATORY_CARE_PROVIDER_SITE_OTHER): Payer: PRIVATE HEALTH INSURANCE | Admitting: Podiatry

## 2016-03-04 ENCOUNTER — Encounter: Payer: Self-pay | Admitting: Podiatry

## 2016-03-04 ENCOUNTER — Ambulatory Visit (INDEPENDENT_AMBULATORY_CARE_PROVIDER_SITE_OTHER): Payer: PRIVATE HEALTH INSURANCE

## 2016-03-04 VITALS — BP 157/86 | HR 76 | Resp 16 | Ht 64.75 in | Wt 125.0 lb

## 2016-03-04 DIAGNOSIS — M79671 Pain in right foot: Secondary | ICD-10-CM

## 2016-03-04 DIAGNOSIS — M7751 Other enthesopathy of right foot: Secondary | ICD-10-CM

## 2016-03-04 NOTE — Progress Notes (Signed)
   Subjective:    Patient ID: Laurie Tran, female    DOB: 27-Jul-1945, 70 y.o.   MRN: [id number]  HPI    Review of Systems  Musculoskeletal: Positive for gait problem.  Allergic/Immunologic: Positive for food allergies.  All other systems reviewed and are negative.      Objective:   Physical Exam        Assessment & Plan:

## 2016-03-10 NOTE — Progress Notes (Signed)
Patient ID: Laurie Tran, female   DOB: 07-12-1945, 70 y.o.   MRN: [id number] Subjective:  Patient presents today for pain and tenderness to the right foot. Pain is been ongoing for several weeks now. Patient states that the pain radiates from the bottom to the top of her foot since 12/29/2015. Patient presents today for further treatment and evaluation.  Patient is also allergic to anti-inflammatory oral medication. She develops bronchitis and hives. Patient also states she had knee surgery back in 2007 to her right lower extremity.    Objective/Physical Exam General: The patient is alert and oriented x3 in no acute distress.  Dermatology: Skin is warm, dry and supple bilateral lower extremities. Negative for open lesions or macerations.  Vascular: Palpable pedal pulses bilaterally. No edema or erythema noted. Capillary refill within normal limits.  Neurological: Epicritic and protective threshold grossly intact bilaterally.   Musculoskeletal Exam: Pain on palpation diffusely over the patient's right forefoot. Range of motion within normal limits to all pedal and ankle joints bilateral. Muscle strength 5/5 in all groups bilateral.   Assessment: #1 pain in right forefoot #2 capsulitis right forefoot   Plan of Care:  #1 Patient was evaluated. #2 today prescription for anti-inflammatory pain cream dispensed through [facility] #3 metatarsal pads dispensed to offload the forefoot #4 patient is to return to clinic when necessary   Dr. Edrick Kins, [place]

## 2016-04-01 ENCOUNTER — Ambulatory Visit: Payer: PRIVATE HEALTH INSURANCE | Admitting: Podiatry

## 2016-04-15 ENCOUNTER — Ambulatory Visit: Payer: PRIVATE HEALTH INSURANCE | Admitting: Obstetrics and Gynecology

## 2016-09-23 ENCOUNTER — Ambulatory Visit: Payer: Self-pay | Admitting: Registered Nurse

## 2016-09-23 VITALS — BP 179/101 | HR 79

## 2016-09-23 DIAGNOSIS — R319 Hematuria, unspecified: Secondary | ICD-10-CM

## 2016-09-23 DIAGNOSIS — R809 Proteinuria, unspecified: Secondary | ICD-10-CM

## 2016-09-23 DIAGNOSIS — E118 Type 2 diabetes mellitus with unspecified complications: Secondary | ICD-10-CM

## 2016-09-23 DIAGNOSIS — Z87442 Personal history of urinary calculi: Secondary | ICD-10-CM

## 2016-09-23 DIAGNOSIS — R109 Unspecified abdominal pain: Secondary | ICD-10-CM

## 2016-09-23 DIAGNOSIS — N3001 Acute cystitis with hematuria: Secondary | ICD-10-CM

## 2016-09-23 LAB — POCT URINALYSIS DIPSTICK
Bilirubin, UA: NEGATIVE
GLUCOSE UA: NEGATIVE
Ketones, UA: NEGATIVE
NITRITE UA: NEGATIVE
Specific Gravity, UA: 1.03
Urobilinogen, UA: 0.2 E.U./dL
pH, UA: 6 (ref 5.0–8.0)

## 2016-09-23 LAB — GLUCOSE, POCT (MANUAL RESULT ENTRY): POC GLUCOSE: 88 mg/dL (ref 70–99)

## 2016-09-23 MED ORDER — ONDANSETRON HCL 4 MG PO TABS: 4.0000 mg | ORAL_TABLET | Freq: Three times a day (TID) | ORAL | 0 refills | Status: AC | PRN

## 2016-09-23 MED ORDER — NITROFURANTOIN MACROCRYSTAL 100 MG PO CAPS: 100.0000 mg | ORAL_CAPSULE | Freq: Four times a day (QID) | ORAL | 0 refills | Status: AC

## 2016-09-23 MED ORDER — TAMSULOSIN HCL 0.4 MG PO CAPS: 0.4000 mg | ORAL_CAPSULE | Freq: Every day | ORAL | 0 refills | Status: DC

## 2016-09-23 MED ORDER — ACETAMINOPHEN 325 MG PO TABS: 650.0000 mg | ORAL_TABLET | Freq: Four times a day (QID) | ORAL | Status: AC | PRN

## 2016-09-23 NOTE — Patient Instructions (Addendum)
Patient to call urology and schedule follow up appt consider xray/imaging Will call with urine culture results when available typically 72 hours Repeat urinalysis in 2 weeks after completing medications or with urology due to blood/protein in urine today Start flomax 0.75m by mouth daily Macrodantin 1032mby mouth every 6 hours x 1 week Tylenol [id number] mouth every 6 hours as needed pain/fever OTC zofran 4mg63m mouth every 8 hours as needed for nausea/vomiting Strain urine Hydrate Be seen same day if unable to void every 8 hours, swelling hands/feet, worsening headache/flank pain fever greater than 100.22F, vomiting despite zofran 4mg 106mmouth every 8 hours as needed   Kidney Stones Kidney stones (urolithiasis) are solid, rock-like deposits that form inside of the organs that make urine (kidneys). A kidney stone may form in a kidney and move into the bladder, where it can cause intense pain and block the flow of urine. Kidney stones are created when high levels of certain minerals are found in the urine. They are usually passed through urination, but in some cases, medical treatment may be needed to remove them. What are the causes? Kidney stones may be caused by:  A condition in which certain glands produce too much parathyroid hormone (primary hyperparathyroidism), which causes too much calcium buildup in the blood.  Buildup of uric acid crystals in the bladder (hyperuricosuria). Uric acid is a chemical that the body produces when you eat certain foods. It usually exits the body in the urine.  Narrowing (stricture) of one or both of the tubes that drain urine from the kidneys to the bladder (ureters).  A kidney blockage that is present at birth (congenital obstruction).  Past surgery on the kidney or the ureters, such as gastric bypass surgery. What increases the risk? The following factors make you more likely to develop kidney stones:  Having had a kidney stone in the  past.  Having a family history of kidney stones.  Not drinking enough water.  Eating a diet that is high in protein, salt (sodium), or sugar.  Being overweight or obese. What are the signs or symptoms? Symptoms of a kidney stone may include:  Nausea.  Vomiting.  Blood in the urine (hematuria).  Pain in the side of the abdomen, right below the ribs (flank pain). Pain usually spreads (radiates) to the groin.  Needing to urinate frequently or urgently. How is this diagnosed? This condition may be diagnosed based on:  Your medical history.  A physical exam.  Blood tests.  Urine tests.  CT scan.  Abdominal X-ray.  A procedure to examine the inside of the bladder (cystoscopy). How is this treated? Treatment for kidney stones depends on the size, location, and makeup of the stones. Treatment may involve:  Analyzing your urine before and after you pass the stone through urination.  Being monitored at the hospital until you pass the stone through urination.  Increasing your fluid intake and decreasing the amount of calcium and protein in your diet.  A procedure to break up kidney stones in the bladder using:  A focused beam of light (laser therapy).  Shock waves (extracorporeal shock wave lithotripsy).  Surgery to remove kidney stones. This may be needed if you have severe pain or have stones that block your urinary tract. Follow these instructions at home: Eating and drinking    Drink enough fluid to keep your urine clear or pale yellow. This will help you to pass the kidney stone.  If directed, change your diet. This  may include:  Limiting how much sodium you eat.  Eating more fruits and vegetables.  Limiting how much meat, poultry, fish, and eggs you eat.  Follow instructions from your health care provider about eating or drinking restrictions. General instructions   Collect urine samples as told by your health care provider. You may need to collect a  urine sample:  24 hours after you pass the stone.  8-12 weeks after passing the kidney stone, and every 6-12 months after that.  Strain your urine every time you urinate, for as long as directed. Use the strainer that your health care provider recommends.  Do not throw out the kidney stone after passing it. Keep the stone so it can be tested by your health care provider. Testing the makeup of your kidney stone may help prevent you from getting kidney stones in the future.  Take over-the-counter and prescription medicines only as told by your health care provider.  Keep all follow-up visits as told by your health care provider. This is important. You may need follow-up X-rays or ultrasounds to make sure that your stone has passed. How is this prevented? To prevent another kidney stone:  Drink enough fluid to keep your urine clear or pale yellow. This is the best way to prevent kidney stones.  Eat a healthy diet and follow recommendations from your health care provider about foods to avoid. You may be instructed to eat a low-protein diet. Recommendations vary depending on the type of kidney stone that you have.  Maintain a healthy weight. Contact a health care provider if:  You have pain that gets worse or does not get better with medicine. Get help right away if:  You have a fever or chills.  You develop severe pain.  You develop new abdominal pain.  You faint.  You are unable to urinate. This information is not intended to replace advice given to you by your health care provider. Make sure you discuss any questions you have with your health care provider. Document Released: 05/12/2005 Document Revised: 11/30/2015 Document Reviewed: 10/26/2015 Elsevier Interactive Patient Education  2017 [place].  Dietary Guidelines to Help Prevent Kidney Stones Kidney stones are deposits of minerals and salts that form inside your kidneys. Your risk of developing kidney stones may be  greater depending on your diet, your lifestyle, the medicines you take, and whether you have certain medical conditions. Most people can reduce their chances of developing kidney stones by following the instructions below. Depending on your overall health and the type of kidney stones you tend to develop, your dietitian may give you more specific instructions. What are tips for following this plan? Reading food labels   Choose foods with "no salt added" or "low-salt" labels. Limit your sodium intake to less than 1500 mg per day.  Choose foods with calcium for each meal and snack. Try to eat about 300 mg of calcium at each meal. Foods that contain 200-500 mg of calcium per serving include:  8 oz (237 ml) of milk, fortified nondairy milk, and fortified fruit juice.  8 oz (237 ml) of kefir, yogurt, and soy yogurt.  4 oz (118 ml) of tofu.  1 oz of cheese.  1 cup (300 g) of dried figs.  1 cup (91 g) of cooked broccoli.  1-3 oz can of sardines or mackerel.  Most people need 1000 to 1500 mg of calcium each day. Talk to your dietitian about how much calcium is recommended for you. Shopping  Buy plenty of fresh fruits and vegetables. Most people do not need to avoid fruits and vegetables, even if they contain nutrients that may contribute to kidney stones.  When shopping for convenience foods, choose:  Whole pieces of fruit.  Premade salads with dressing on the side.  Low-fat fruit and yogurt smoothies.  Avoid buying frozen meals or prepared deli foods.  Look for foods with live cultures, such as yogurt and kefir. Cooking   Do not add salt to food when cooking. Place a salt shaker on the table and allow each person to add his or her own salt to taste.  Use vegetable protein, such as beans, textured vegetable protein (TVP), or tofu instead of meat in pasta, casseroles, and soups. Meal planning   Eat less salt, if told by your dietitian. To do this:  Avoid eating processed or  premade food.  Avoid eating fast food.  Eat less animal protein, including cheese, meat, poultry, or fish, if told by your dietitian. To do this:  Limit the number of times you have meat, poultry, fish, or cheese each week. Eat a diet free of meat at least 2 days a week.  Eat only one serving each day of meat, poultry, fish, or seafood.  When you prepare animal protein, cut pieces into small portion sizes. For most meat and fish, one serving is about the size of one deck of cards.  Eat at least 5 servings of fresh fruits and vegetables each day. To do this:  Keep fruits and vegetables on hand for snacks.  Eat 1 piece of fruit or a handful of berries with breakfast.  Have a salad and fruit at lunch.  Have two kinds of vegetables at dinner.  Limit foods that are high in a substance called oxalate. These include:  Spinach.  Rhubarb.  Beets.  Potato chips and french fries.  Nuts.  If you regularly take a diuretic medicine, make sure to eat at least 1-2 fruits or vegetables high in potassium each day. These include:  Avocado.  Banana.  Orange, prune, carrot, or tomato juice.  Baked potato.  Cabbage.  Beans and split peas. General instructions   Drink enough fluid to keep your urine clear or pale yellow. This is the most important thing you can do.  Talk to your health care provider and dietitian about taking daily supplements. Depending on your health and the cause of your kidney stones, you may be advised:  Not to take supplements with vitamin C.  To take a calcium supplement.  To take a daily probiotic supplement.  To take other supplements such as magnesium, fish oil, or vitamin B6.  Take all medicines and supplements as told by your health care provider.  Limit alcohol intake to no more than 1 drink a day for nonpregnant women and 2 drinks a day for men. One drink equals 12 oz of beer, 5 oz of wine, or 1 oz of hard liquor.  Lose weight if told by your  health care provider. Work with your dietitian to find strategies and an eating plan that works best for you. What foods are not recommended? Limit your intake of the following foods, or as told by your dietitian. Talk to your dietitian about specific foods you should avoid based on the type of kidney stones and your overall health. Grains  Breads. Bagels. Rolls. Baked goods. Salted crackers. Cereal. Pasta. Vegetables  Spinach. Rhubarb. Beets. Canned vegetables. Angie Fava. Olives. Meats and other protein foods  Nuts.  Nut butters. Large portions of meat, poultry, or fish. Salted or cured meats. Deli meats. Hot dogs. Sausages. Dairy  Cheese. Beverages  Regular soft drinks. Regular vegetable juice. Seasonings and other foods  Seasoning blends with salt. Salad dressings. Canned soups. Soy sauce. Ketchup. Barbecue sauce. Canned pasta sauce. Casseroles. Pizza. Lasagna. Frozen meals. Potato chips. [place] fries. Summary  You can reduce your risk of kidney stones by making changes to your diet.  The most important thing you can do is drink enough fluid. You should drink enough fluid to keep your urine clear or pale yellow.  Ask your health care provider or dietitian how much protein from animal sources you should eat each day, and also how much salt and calcium you should have each day. This information is not intended to replace advice given to you by your health care provider. Make sure you discuss any questions you have with your health care provider. Document Released: 09/06/2010 Document Revised: 04/22/2016 Document Reviewed: 04/22/2016 Elsevier Interactive Patient Education  2017 [employer]  Proteinuria Proteinuria is when there is too much protein in the urine. Proteins are important for buildingmuscles and bones. Proteins are also needed to fight infections, help the blood clot, and keep body fluids in balance. Proteinuria may be mild and temporary, or it may be an early sign of kidney  disease. The kidneys make urine. Healthy kidneys also keep substances like proteins from leaving the blood and ending up in the urine. Proteinuria may be a sign that the kidneys are not working well. What are the causes? Healthy kidneys have filters (glomeruli) that keep proteins out of the urine. Proteinuria may mean that the glomeruli are damaged. The main causes of this type of damage are:  Diabetes.  High blood pressure. Other causes of kidney damage can also cause proteinuria, such as:  Diseases of the immune system, such as lupus, rheumatoid arthritis, sarcoidosis, and Goodpasture syndrome.  Heart disease or heart failure.  Kidney infection.  Certain cancers, including kidney cancer, lymphoma, leukemia, and multiple myeloma.  Amyloidosis. This is a disease that causes abnormal proteins to build up in body tissues.  Reactions to certain medicines, such as NSAIDs.  Injury (trauma) or poisons (toxins).  High blood pressure that occurs during pregnancy (preeclampsia). Temporary proteinuria may result from conditions that put stress on the kidneys. These conditions usually do not cause kidney damage. They include:  Fever.  Exposure to cold or heat.  Emotional or physical stress.  Extreme exercise.  Standing for long periods of time. What increases the risk? This condition is more likely to develop in people who:  Have diabetes.  Have high blood pressure.  Have heart disease or heart failure.  Have an immune disease, cancer, or other disease that affects the kidneys.  Have a family history of kidney disease.  Are 65 or older.  Are overweight.  Are of African-American, American [place], Hispanic/Latino, or [place] descent.  Are pregnant.  Have an infection. What are the signs or symptoms? Mild proteinuria may not cause symptoms. As more proteins enter the urine, symptoms of kidney disease may develop, such as:  Foamy urine.  Swelling of the face,  abdomen, hands, legs, or feet (edema).  Needing to urinate frequently.  Fatigue.  Difficulty sleeping.  Dry and itchy skin.  Nausea and vomiting.  Muscle cramps.  Shortness of breath. How is this diagnosed? Your health care provider can diagnose proteinuria with a urine test. You may have this test as part of a  routine physical or because you have symptoms of kidney disease or risk factors for kidney disease. You may also have:  Blood tests to measure the level of a certain substance (creatinine) that increases with kidney disease.  Imaging tests of your kidney, such as a CT scan or an ultrasound, to look for signs of kidney damage. How is this treated? If your proteinuria is mild or temporary, no treatment may be needed. Your health care provider may show you how to monitor the level of protein in your urine at home. Identifying proteinuria early is important so that the cause of the condition can be treated. Treatment depends on the cause of your proteinuria. Treatment may include:  Making diet and lifestyle changes.  Getting blood pressure under control.  Getting blood sugar under control, if you have diabetes.  Managing any other medical conditions you have that affect your kidneys.  Giving birth, if you are pregnant.  Avoiding medicines that damage your kidneys.  Treating kidney disease with medicine and dialysis, as needed. Follow these instructions at home:  Check your protein levels at home, if directed by your health care provider.  Follow instructions from your health care provider about eating or drinking restrictions.  Take over-the-counter and prescription medicines only as told by your health care provider.  Return to your normal activities as told by your health care provider. Ask your health care provider what activities are safe for you.  If you are overweight, ask your health care provider to help you with a diet to get to a healthy weight.  Ask  your health care provider to help you with an exercise program.  Keep all follow-up visits as told by your health care provider. This is important. Contact a health care provider if:  You have new symptoms.  Your symptoms get worse or do not improve. Get help right away if:  You have back pain.  You have diarrhea.  You vomit.  You have a fever.  You have a rash. This information is not intended to replace advice given to you by your health care provider. Make sure you discuss any questions you have with your health care provider. Document Released: 07/02/2005 Document Revised: 06/22/2015 Document Reviewed: 04/02/2015 Elsevier Interactive Patient Education  2017 [employer]  Hematuria, Adult Hematuria is blood in your urine. It can be caused by a bladder infection, kidney infection, prostate infection, kidney stone, or cancer of your urinary tract. Infections can usually be treated with medicine, and a kidney stone usually will pass through your urine. If neither of these is the cause of your hematuria, further workup to find out the reason may be needed. It is very important that you tell your health care provider about any blood you see in your urine, even if the blood stops without treatment or happens without causing pain. Blood in your urine that happens and then stops and then happens again can be a symptom of a very serious condition. Also, pain is not a symptom in the initial stages of many urinary cancers. Follow these instructions at home:  Drink lots of fluid, 3-4 quarts a day. If you have been diagnosed with an infection, cranberry juice is especially recommended, in addition to large amounts of water.  Avoid caffeine, tea, and carbonated beverages because they tend to irritate the bladder.  Avoid alcohol because it may irritate the prostate.  Take all medicines as directed by your health care provider.  If you were prescribed an antibiotic medicine,  finish it all  even if you start to feel better.  If you have been diagnosed with a kidney stone, follow your health care provider's instructions regarding straining your urine to catch the stone.  Empty your bladder often. Avoid holding urine for long periods of time.  After a bowel movement, women should cleanse front to back. Use each tissue only once.  Empty your bladder before and after sexual intercourse if you are a female. Contact a health care provider if:  You develop back pain.  You have a fever.  You have a feeling of sickness in your stomach (nausea) or vomiting.  Your symptoms are not better in 3 days. Return sooner if you are getting worse. Get help right away if:  You develop severe vomiting and are unable to keep the medicine down.  You develop severe back or abdominal pain despite taking your medicines.  You begin passing a large amount of blood or clots in your urine.  You feel extremely weak or faint, or you pass out. This information is not intended to replace advice given to you by your health care provider. Make sure you discuss any questions you have with your health care provider. Document Released: 05/12/2005 Document Revised: 10/18/2015 Document Reviewed: 01/10/2013 Elsevier Interactive Patient Education  2017 [place].  Urinary Tract Infection, Adult A urinary tract infection (UTI) is an infection of any part of the urinary tract, which includes the kidneys, ureters, bladder, and urethra. These organs make, store, and get rid of urine in the body. UTI can be a bladder infection (cystitis) or kidney infection (pyelonephritis). What are the causes? This infection may be caused by fungi, viruses, or bacteria. Bacteria are the most common cause of UTIs. This condition can also be caused by repeated incomplete emptying of the bladder during urination. What increases the risk? This condition is more likely to develop if:  You ignore your need to urinate or hold urine  for long periods of time.  You do not empty your bladder completely during urination.  You wipe back to front after urinating or having a bowel movement, if you are female.  You are uncircumcised, if you are female.  You are constipated.  You have a urinary catheter that stays in place (indwelling).  You have a weak defense (immune) system.  You have a medical condition that affects your bowels, kidneys, or bladder.  You have diabetes.  You take antibiotic medicines frequently or for long periods of time, and the antibiotics no longer work well against certain types of infections (antibiotic resistance).  You take medicines that irritate your urinary tract.  You are exposed to chemicals that irritate your urinary tract.  You are female. What are the signs or symptoms? Symptoms of this condition include:  Fever.  Frequent urination or passing small amounts of urine frequently.  Needing to urinate urgently.  Pain or burning with urination.  Urine that smells bad or unusual.  Cloudy urine.  Pain in the lower abdomen or back.  Trouble urinating.  Blood in the urine.  Vomiting or being less hungry than normal.  Diarrhea or abdominal pain.  Vaginal discharge, if you are female. How is this diagnosed? This condition is diagnosed with a medical history and physical exam. You will also need to provide a urine sample to test your urine. Other tests may be done, including:  Blood tests.  Sexually transmitted disease (STD) testing. If you have had more than one UTI, a cystoscopy or imaging  studies may be done to determine the cause of the infections. How is this treated? Treatment for this condition often includes a combination of two or more of the following:  Antibiotic medicine.  Other medicines to treat less common causes of UTI.  Over-the-counter medicines to treat pain.  Drinking enough water to stay hydrated. Follow these instructions at home:  Take  over-the-counter and prescription medicines only as told by your health care provider.  If you were prescribed an antibiotic, take it as told by your health care provider. Do not stop taking the antibiotic even if you start to feel better.  Avoid alcohol, caffeine, tea, and carbonated beverages. They can irritate your bladder.  Drink enough fluid to keep your urine clear or pale yellow.  Keep all follow-up visits as told by your health care provider. This is important.  Make sure to:  Empty your bladder often and completely. Do not hold urine for long periods of time.  Empty your bladder before and after sex.  Wipe from front to back after a bowel movement if you are female. Use each tissue one time when you wipe. Contact a health care provider if:  You have back pain.  You have a fever.  You feel nauseous or vomit.  Your symptoms do not get better after 3 days.  Your symptoms go away and then return. Get help right away if:  You have severe back pain or lower abdominal pain.  You are vomiting and cannot keep down any medicines or water. This information is not intended to replace advice given to you by your health care provider. Make sure you discuss any questions you have with your health care provider. Document Released: 02/19/2005 Document Revised: 10/24/2015 Document Reviewed: 04/02/2015 Elsevier Interactive Patient Education  2017 [employer].

## 2016-09-23 NOTE — Progress Notes (Signed)
Subjective:    Patient ID: Laurie Tran, female    DOB: 1946/01/11, 71 y.o.   MRN: [id number]  70y/o caucasian female reports generalized abd pain that wrapped to L flank this am. Lasted 10 min of severe pain. Now with achy pain in abd and flank. Had nausea during severe pain. No emesis. DM2. Controlled with Metformin. Does not check sugars at home. Last check at MD office, was normal per pt. Reports similar pain previously when she had a kidney stone 3 months ago. Denies dysuria or hematuria. Patient has strainer at home has not seen stone.  Denied gross hematuria but urine dark.  Has not used azo has at home. Reported macrodantin has worked well for her in the past.  Pain 7/10 now.  Some nausea currently.  Has not required zofran/phenergan and doesn't remember if she took flomax.  Unable to tolerate motrin; has taken tylenol without difficulty/side effects.  Required surgery left side basket retrieval and lithotripsy last saw urology 3 months ago no active stones.  Not following any special diet.  Requested blood sugar check in clinic as feeling shaky, headache, nausea.  Reviewed care everywhere and 2012 CT/xray showed right nephrolithiasis; patient reported left stone with most recent episode.  Denied swelling hands/feet, visual changes, chest pain, shortness of breath, vomiting, diarrhea, fever.      Review of Systems  Constitutional: Positive for diaphoresis. Negative for activity change, appetite change, chills, fatigue and fever.  HENT: Negative for congestion, ear discharge, hearing loss, mouth sores, nosebleeds, postnasal drip, rhinorrhea, sinus pain, sinus pressure, sneezing, sore throat, tinnitus, trouble swallowing and voice change.   Eyes: Negative for photophobia and visual disturbance.  Respiratory: Negative for cough, choking, chest tightness, shortness of breath and wheezing.   Cardiovascular: Negative for chest pain, palpitations and leg swelling.  Gastrointestinal: Positive for  abdominal pain and nausea. Negative for abdominal distention, anal bleeding, blood in stool, constipation, diarrhea, rectal pain and vomiting.  Endocrine: Negative for cold intolerance, heat intolerance, polydipsia, polyphagia and polyuria.  Genitourinary: Positive for flank pain. Negative for decreased urine volume, difficulty urinating, dyspareunia, dysuria, enuresis, frequency, genital sores, hematuria, menstrual problem, urgency, vaginal bleeding, vaginal discharge and vaginal pain.  Musculoskeletal: Positive for back pain and myalgias. Negative for arthralgias, gait problem, joint swelling, neck pain and neck stiffness.  Allergic/Immunologic: Negative for food allergies.  Neurological: Positive for tremors and headaches. Negative for dizziness, seizures, syncope, facial asymmetry, speech difficulty, weakness, light-headedness and numbness.  Hematological: Negative for adenopathy. Does not bruise/bleed easily.  Psychiatric/Behavioral: Positive for sleep disturbance.       Objective:   Physical Exam  Constitutional: She is oriented to person, place, and time. She appears well-developed and well-nourished. She is active and cooperative.  Non-toxic appearance. She does not have a sickly appearance. She does not appear ill. No distress.  HENT:  Head: Normocephalic and atraumatic.  Right Ear: Hearing, external ear and ear canal normal. A middle ear effusion is present.  Left Ear: Hearing, external ear and ear canal normal. A middle ear effusion is present.  Nose: No mucosal edema, rhinorrhea, nose lacerations, sinus tenderness, nasal deformity, septal deviation or nasal septal hematoma. No epistaxis.  No foreign bodies. Right sinus exhibits no maxillary sinus tenderness and no frontal sinus tenderness. Left sinus exhibits no maxillary sinus tenderness and no frontal sinus tenderness.  Mouth/Throat: Uvula is midline and mucous membranes are normal. Mucous membranes are not pale, not dry and not  cyanotic. She does not have dentures. No  oral lesions. No trismus in the jaw. Normal dentition. No dental abscesses, uvula swelling, lacerations or dental caries. No oropharyngeal exudate, posterior oropharyngeal edema, posterior oropharyngeal erythema or tonsillar abscesses.  Bilateral TMs air fluid level clear; bilateral allergic shiners  Eyes: Conjunctivae, EOM and lids are normal. Pupils are equal, round, and reactive to light. Right eye exhibits no chemosis, no discharge, no exudate and no hordeolum. No foreign body present in the right eye. Left eye exhibits no chemosis, no discharge, no exudate and no hordeolum. No foreign body present in the left eye. Right conjunctiva is not injected. Right conjunctiva has no hemorrhage. Left conjunctiva is not injected. Left conjunctiva has no hemorrhage. No scleral icterus. Right eye exhibits normal extraocular motion and no nystagmus. Left eye exhibits normal extraocular motion and no nystagmus. Right pupil is round and reactive. Left pupil is round and reactive. Pupils are equal.  Neck: Trachea normal, normal range of motion and phonation normal. Neck supple. No tracheal tenderness, no spinous process tenderness and no muscular tenderness present. No neck rigidity. No tracheal deviation, no edema, no erythema and normal range of motion present. No thyroid mass and no thyromegaly present.  Cardiovascular: Normal rate, regular rhythm, S1 normal, S2 normal, normal heart sounds and intact distal pulses.  PMI is not displaced.  Exam reveals no gallop and no friction rub.   No murmur heard. Pulses:      Radial pulses are 2+ on the right side, and 2+ on the left side.  No lower extremity edema capillary refill less than 2 seconds bilateral lower extremtities  Pulmonary/Chest: Effort normal and breath sounds normal. No accessory muscle usage or stridor. No respiratory distress. She has no decreased breath sounds. She has no wheezes. She has no rhonchi. She has no  rales. She exhibits no tenderness.  Abdominal: Soft. Normal appearance. She exhibits no shifting dullness, no distension, no pulsatile liver, no fluid wave, no abdominal bruit, no ascites, no pulsatile midline mass and no mass. Bowel sounds are decreased. There is no hepatosplenomegaly. There is no tenderness. There is no rigidity, no rebound, no guarding, no CVA tenderness, no tenderness at McBurney's point and negative Murphy's sign. No hernia. Hernia confirmed negative in the ventral area.  Dull to percussion RUQ/LLQ/RLQ; tympanny LUQ; hypoactive bowel sounds x 4 quads  Musculoskeletal: Normal range of motion. She exhibits no edema or tenderness.       Right shoulder: Normal.       Left shoulder: Normal.       Right hip: Normal.       Left hip: Normal.       Right knee: Normal.       Left knee: Normal.       Right ankle: Normal.       Left ankle: Normal.       Cervical back: Normal.       Thoracic back: Normal.       Lumbar back: She exhibits pain. She exhibits normal range of motion, no tenderness, no bony tenderness, no swelling, no edema, no deformity, no laceration and no spasm.       Back:       Right hand: Normal.       Left hand: Normal.       Right lower leg: Normal.       Left lower leg: Normal.  No pain to palpation; on/off exam table without difficulty; negative bilateral CVA tenderness; in/out of chair without difficulty  Lymphadenopathy:  Head (right side): No submental, no submandibular, no tonsillar, no preauricular, no posterior auricular and no occipital adenopathy present.       Head (left side): No submental, no submandibular, no tonsillar, no preauricular, no posterior auricular and no occipital adenopathy present.    She has no cervical adenopathy.       Right cervical: No superficial cervical, no deep cervical and no posterior cervical adenopathy present.      Left cervical: No superficial cervical, no deep cervical and no posterior cervical adenopathy  present.  Neurological: She is alert and oriented to person, place, and time. She has normal strength. She is not disoriented. She displays no atrophy and no tremor. No cranial nerve deficit or sensory deficit. She exhibits normal muscle tone. She displays no seizure activity. Coordination and gait normal. GCS eye subscore is 4. GCS verbal subscore is 5. GCS motor subscore is 6.  Gait sure and steady in hallway bilateral hand grasp equal 5/5  Skin: Skin is warm, dry and intact. No abrasion, no bruising, no burn, no ecchymosis, no laceration, no lesion, no petechiae and no rash noted. She is not diaphoretic. No cyanosis or erythema. No pallor. Nails show no clubbing.  Psychiatric: She has a normal mood and affect. Her speech is normal and behavior is normal. Judgment and thought content normal. Cognition and memory are normal.  Nursing note and vitals reviewed.   Discussed lab urine dipstick with patient see paper record for results.  Urine culture pending will call when available typically 72 hours as ship out to [facility] no history on file of antibiotic resistence with previous cultures.  Reviewed care everywhere and chart review/lab/rads with patient on file in Epic.  Patient verbalized understanding information/instructions, agreed with plan of care and had no further questions at this time.    Assessment & Plan:  A-microscopic hematuria, proteinuria, history of kidney stones, back pain  P-Discussed results blood, leukocystes and protein with patient.  Patient to follow up with urology as history of kidney stones.  ER/UCC/urology same day if unable to void every 8 hours, repetitive vomiting/unable to tolerate po intake, swelling hands/feet or no improvement with urinary symptoms x 48 hours on macrodantin 100mg  po QID x 7 days #28 RF0 and flomax 0.4mg  po daily #30 RF0.  Patient verbalized understanding information/instructions, agreed with plan of care and had no further questions at this time.  Urine  culture pending no history of resistance on chart review last kidney stone 3 months ago per patient.  Repeat urinalysis in 2 weeks after completing all antibiotics to verify if proteinuria/hematuria resolved.  Avoid dehydration.  Urology apt to be scheduled if no stone passed in next 3 days.  Strain all urine.  Hydrate, hydrate.   Flomax 0.4mg  daily as directed.  zofran 4mg  ODT po q8h prn nausea/vomiting. Patient is also to push fluids and strain urine. Call or return to clinic as needed if these symptoms worsen or fail to improve as anticipated e.g. gross hematuria, fever, worsening pain, unable to void.  Exitcare handout on nephrolithiasis, dietary guidelines to prevent kidney stones, proteinuria, hematuria, UTI given to patient.  Patient verbalized agreement and understanding of treatment plan and had no further questions at this time. P2:  Hydrate, post coital urination, and cranberry juice

## 2016-09-25 LAB — URINE CULTURE

## 2016-09-29 NOTE — Progress Notes (Signed)
Spoke with patient. Results given. Her pain is resolved. Asymptomatic. Has one day of abx remaining. Instructed her to finish this as prescribed.  No other questions or concerns.

## 2016-09-30 ENCOUNTER — Emergency Department (HOSPITAL_COMMUNITY)
Admission: EM | Admit: 2016-09-30 | Discharge: 2016-10-01 | Disposition: A | Discharge status: Home / Self Care | Payer: No Typology Code available for payment source | Attending: Emergency Medicine | Admitting: Emergency Medicine

## 2016-09-30 ENCOUNTER — Encounter (HOSPITAL_COMMUNITY): Payer: Self-pay

## 2016-09-30 DIAGNOSIS — Z79899 Other long term (current) drug therapy: Secondary | ICD-10-CM | POA: Insufficient documentation

## 2016-09-30 DIAGNOSIS — E119 Type 2 diabetes mellitus without complications: Secondary | ICD-10-CM | POA: Insufficient documentation

## 2016-09-30 DIAGNOSIS — N200 Calculus of kidney: Secondary | ICD-10-CM

## 2016-09-30 DIAGNOSIS — Z7984 Long term (current) use of oral hypoglycemic drugs: Secondary | ICD-10-CM | POA: Insufficient documentation

## 2016-09-30 DIAGNOSIS — I10 Essential (primary) hypertension: Secondary | ICD-10-CM | POA: Diagnosis not present

## 2016-09-30 DIAGNOSIS — R109 Unspecified abdominal pain: Secondary | ICD-10-CM | POA: Diagnosis present

## 2016-09-30 LAB — COMPREHENSIVE METABOLIC PANEL
ALK PHOS: 94 U/L (ref 38–126)
ALT: 22 U/L (ref 14–54)
AST: 23 U/L (ref 15–41)
Albumin: 4.1 g/dL (ref 3.5–5.0)
Anion gap: 11 (ref 5–15)
BUN: 19 mg/dL (ref 6–20)
CHLORIDE: 102 mmol/L (ref 101–111)
CO2: 27 mmol/L (ref 22–32)
Calcium: 10.1 mg/dL (ref 8.9–10.3)
Creatinine, Ser: 0.79 mg/dL (ref 0.44–1.00)
GFR calc Af Amer: >60 mL/min (ref >60)
GFR calc non Af Amer: >60 mL/min (ref >60)
GLUCOSE: 146 mg/dL — AB (ref 65–99)
POTASSIUM: 3.3 mmol/L — AB (ref 3.5–5.1)
Sodium: 140 mmol/L (ref 135–145)
TOTAL PROTEIN: 7.1 g/dL (ref 6.5–8.1)
Total Bilirubin: 0.6 mg/dL (ref 0.3–1.2)

## 2016-09-30 LAB — URINALYSIS, ROUTINE W REFLEX MICROSCOPIC
BACTERIA UA: NONE SEEN
BILIRUBIN URINE: NEGATIVE
Glucose, UA: NEGATIVE mg/dL
KETONES UR: NEGATIVE mg/dL
Nitrite: NEGATIVE
PROTEIN: 100 mg/dL — AB
Specific Gravity, Urine: 1.018 (ref 1.005–1.030)
pH: 5 (ref 5.0–8.0)

## 2016-09-30 LAB — CBC
HEMATOCRIT: 37.2 % (ref 36.0–46.0)
HEMOGLOBIN: 12.3 g/dL (ref 12.0–15.0)
MCH: 28.4 pg (ref 26.0–34.0)
MCHC: 33.1 g/dL (ref 30.0–36.0)
MCV: 85.9 fL (ref 78.0–100.0)
Platelets: 255 10*3/uL (ref 150–400)
RBC: 4.33 MIL/uL (ref 3.87–5.11)
RDW: 13 % (ref 11.5–15.5)
WBC: 9.4 10*3/uL (ref 4.0–10.5)

## 2016-09-30 LAB — LIPASE, BLOOD: LIPASE: 47 U/L (ref 11–51)

## 2016-09-30 MED ORDER — FENTANYL CITRATE (PF) 100 MCG/2ML IJ SOLN
INTRAMUSCULAR | Status: AC
  Filled 2016-09-30: qty 2

## 2016-09-30 MED ORDER — FENTANYL CITRATE (PF) 100 MCG/2ML IJ SOLN
50.0000 ug | INTRAMUSCULAR | Status: DC | PRN
  Administered 2016-09-30: 50 ug via NASAL

## 2016-09-30 NOTE — ED Triage Notes (Signed)
Emesis x3 today.

## 2016-09-30 NOTE — ED Triage Notes (Signed)
Pt reports left flank pain and abdominal pain, onset last week associated with hematuria, but denies dysuria. She states she is concerned she has a kidney stone. Last week diagnosed with UTI and finished the abx for that.

## 2016-09-30 NOTE — ED Provider Notes (Signed)
[facility] DEPT Provider Note   CSN: [id number] Arrival date & time: 09/30/16  2141  By signing my name below, I, Jeanell Sparrow, attest that this documentation has been prepared under the direction and in the presence of Varney Biles, MD. Electronically Signed: Jeanell Sparrow, Scribe. 09/30/2016. 12:46 AM.  History   Chief Complaint Chief Complaint  Patient presents with  . Flank Pain    The history is provided by the patient. No language interpreter was used.    HPI Comments: Laurie Tran is a 71 y.o. female with a PMHx of kidney stones and UTI who presents to the Emergency Department complaining of intermittent moderate left flank pain that started about a week ago. She was seen for the same complaint on 09/23/16 but she never followed up with the Urologist. She came to the ED today because she had more intense pain than usual with vomiting. She describes the pain as aching, 10/10 at is worst, and 2/10 currently. She reports associated chills, nausea, vomiting, abdominal pain on the L side. She admits to a hx of additional vena cava, HTN, DM, and hyperlipidemia. Denies any hx of diverticulitis, hx of fibroids/cysts, pain with eating, fever, blood in stool, diarrhea, dysuria, hematuria (recent), or other complaints at this time.   Past Medical History:  Diagnosis Date  . Allergy   . Diabetes mellitus   . GERD (gastroesophageal reflux disease)   . History of chicken pox   . History of UTI   . Hypertension   . Kidney stone     Patient Active Problem List   Diagnosis Date Noted  . Vaginal itching 02/08/2016  . Right foot pain 02/08/2016  . Shingles 11/13/2011  . Routine general medical examination at a health care facility 05/30/2011  . Other screening mammogram 05/30/2011  . Colon polyps 05/30/2011  . DM type 2 (diabetes mellitus, type 2) ([place]) 10/29/2010  . GERD (gastroesophageal reflux disease) 10/29/2010  . HTN (hypertension) 10/29/2010  . Hyperlipidemia 10/29/2010  .  History of kidney stones 10/29/2010  . Perennial allergic rhinitis 10/29/2010    Past Surgical History:  Procedure Laterality Date  . BREAST SURGERY  1085   breast biopsy benign  . j basket retrieval     for kidney stones  . lipotripsy    . MENISCUS REPAIR  2006   rt knee    OB History    Gravida Para Term Preterm AB Living   2 2 2     2    SAB TAB Ectopic Multiple Live Births           2       Home Medications    Prior to Admission medications   Medication Sig Start Date End Date Taking? Authorizing Provider  atorvastatin (LIPITOR) 10 MG tablet Take 1 tablet (10 mg total) by mouth daily. **needs to schedule an appt with doctor before future refills are given 02/08/16  Yes Tower, Wynelle Fanny, MD  Cyanocobalamin (VITAMIN B 12 PO) Take 1 tablet by mouth daily.   Yes [provider]  diphenhydrAMINE (BENADRYL) 25 mg capsule Take 25 mg by mouth at bedtime.    Yes [provider]  fexofenadine (ALLEGRA) 180 MG tablet TAKE 1 TABLET (180 MG TOTAL) BY MOUTH DAILY. 02/08/16  Yes Tower, Wynelle Fanny, MD  hydrochlorothiazide (MICROZIDE) 12.5 MG capsule TAKE 1 CAPSULE (12.5 MG TOTAL) BY MOUTH DAILY. 02/08/16  Yes Tower, Wynelle Fanny, MD  metFORMIN (GLUCOPHAGE) 500 MG tablet TAKE 1 TABLET (500 MG TOTAL) BY  MOUTH DAILY WITH BREAKFAST **needs an appt. with doctor before future refills are given 02/08/16  Yes Tower, Wynelle Fanny, MD  mometasone (NASONEX) 50 MCG/ACT nasal spray Place 2 sprays into the nose daily as needed. Patient taking differently: Place 2 sprays into the nose daily as needed (for allergies).  02/08/16  Yes Tower, Wynelle Fanny, MD  Multiple Vitamins-Minerals (PRESERVISION AREDS) CAPS Take 1 capsule by mouth daily.    Yes [provider]  NON FORMULARY Take 800 mg by mouth daily. CORN SILK   Yes [provider]  Omega-3 Fatty Acids (FISH OIL) 1000 MG CAPS Take 1,000 mg by mouth daily.    Yes [provider]  VITAMIN D, ERGOCALCIFEROL, PO Take 1 tablet by mouth  daily.    Yes [provider]  acetaminophen (TYLENOL) 325 MG tablet Take 2 tablets (650 mg total) by mouth every 6 (six) hours as needed for moderate pain. Patient not taking: Reported on 10/01/2016 09/23/16 10/01/16  Gerarda Fraction A, NP  clobetasol ointment (TEMOVATE) 0.05 % Apply at bedtime to the affected area Patient not taking: Reported on 10/01/2016 02/26/16   Constant, Peggy, MD  HYDROcodone-acetaminophen (NORCO/VICODIN) 5-325 MG tablet Take 1 tablet by mouth every 6 (six) hours as needed. 10/01/16   Varney Biles, MD  nitrofurantoin (MACRODANTIN) 100 MG capsule Take 1 capsule (100 mg total) by mouth 4 (four) times daily. Patient not taking: Reported on 10/01/2016 09/23/16 10/01/16  Gerarda Fraction A, NP  omeprazole (PRILOSEC) 20 MG capsule TAKE 1 CAPSULE (20 MG TOTAL) BY MOUTH DAILY AS NEEDED. Patient not taking: Reported on 09/23/2016 02/08/16   Tower, Wynelle Fanny, MD  tamsulosin (FLOMAX) 0.4 MG CAPS capsule Take 1 capsule (0.4 mg total) by mouth daily. Patient not taking: Reported on 10/01/2016 09/23/16   Olen Cordial, NP    Family History Family History  Problem Relation Age of Onset  . Cancer Mother     breast CA  . Arthritis Paternal Grandmother   . Stroke Paternal Grandmother   . Diabetes Paternal Grandmother   . Cancer Sister   . Breast cancer Sister     Social History Social History  Substance Use Topics  . Smoking status: Never Smoker  . Smokeless tobacco: Never Used  . Alcohol use No     Allergies   Nsaids; Celebrex [celecoxib]; Vioxx [rofecoxib]; and Ibuprofen   Review of Systems Review of Systems  Constitutional: Positive for chills. Negative for fever.  Gastrointestinal: Positive for abdominal pain, nausea and vomiting. Negative for blood in stool and diarrhea.  Genitourinary: Positive for flank pain (Left). Negative for dysuria and hematuria.  Musculoskeletal: Positive for back pain.  All other systems reviewed and are negative.    Physical  Exam Updated Vital Signs BP (!) 147/74   Pulse 69   Temp 98.1 F (36.7 C) (Oral)   Resp 18   SpO2 95%   Physical Exam  Constitutional: She appears well-developed and well-nourished. No distress.  HENT:  Head: Normocephalic.  Eyes: Conjunctivae are normal.  Neck: Neck supple.  Cardiovascular: Normal rate and regular rhythm.   Pulmonary/Chest: Effort normal. No respiratory distress. She has no wheezes. She has no rales.  Abdominal: Soft. There is tenderness. There is no rebound and no guarding.  TTP to the LLQ without rebound or guarding.   Musculoskeletal: Normal range of motion.  Neurological: She is alert.  Skin: Skin is warm and dry.  Psychiatric: She has a normal mood and affect.  Nursing note and vitals reviewed.  ED Treatments / Results  DIAGNOSTIC STUDIES: Oxygen Saturation is 95% on RA, normal by my interpretation.    COORDINATION OF CARE: 12:50 AM-Discussed treatment plan with pt at bedside and pt agreed to plan.   3:11 AM - Discussed with Pt the results of ultrasound. Pt understands her diagnosis and agrees to updated treatment plan.  Labs (all labs ordered are listed, but only abnormal results are displayed) Labs Reviewed  COMPREHENSIVE METABOLIC PANEL - Abnormal; Notable for the following:       Result Value   Potassium 3.3 (*)    Glucose, Bld 146 (*)    All other components within normal limits  URINALYSIS, ROUTINE W REFLEX MICROSCOPIC - Abnormal; Notable for the following:    APPearance HAZY (*)    Hgb urine dipstick LARGE (*)    Protein, ur 100 (*)    Leukocytes, UA TRACE (*)    Squamous Epithelial / LPF 0-5 (*)    All other components within normal limits  LIPASE, BLOOD  CBC    EKG  EKG Interpretation None       Radiology US Renal  Result Date: 10/01/2016 CLINICAL DATA:  Left flank pain for 1 week. History of diabetes, hypertension, urinary tract infection, and kidney stones. EXAM: RENAL / URINARY TRACT ULTRASOUND COMPLETE COMPARISON:   CT abdomen and pelvis 04/02/2016 FINDINGS: Right Kidney: Length: 10.7 cm. Echogenicity within normal limits. No mass or hydronephrosis visualized. Left Kidney: Length: 10.7 cm. Mild hydronephrosis. The stone is demonstrated in the midpole measuring 5 mm diameter. This corresponds to the stones seen in this location on prior CT. Bladder: Bladder wall is not thickened. No filling defects. Bilateral urine flow jets are demonstrated on color flow Doppler imaging. IMPRESSION: Nonobstructing intrarenal stone on the left kidney. Mild left renal hydronephrosis of nonspecific etiology. Bilateral urine flow jets are seen suggesting no evidence of complete obstruction. Electronically Signed   By: Lucienne Capers M.D.   On: 10/01/2016 01:52    Procedures Procedures (including critical care time)  Medications Ordered in ED Medications  fentaNYL (SUBLIMAZE) injection 50 mcg ( Nasal Canceled Entry 09/30/16 2200)  HYDROcodone-acetaminophen (NORCO/VICODIN) 5-325 MG per tablet 1 tablet (not administered)     Initial Impression / Assessment and Plan / ED Course  I have reviewed the triage vital signs and the nursing notes.  Pertinent labs & imaging results that were available during my care of the patient were reviewed by me and considered in my medical decision making (see chart for details).  Clinical Course as of Oct 01 312  Wed Oct 01, 2016  8119 Discussed with Pt the results of ultrasound. Pt understands her diagnosis and agrees to updated treatment plan.  [DW]    Clinical Course User Index [DW] Theresia Bough    I personally performed the services described in this documentation, which was scribed in my presence. The recorded information has been reviewed and is accurate.   Pt comes in with cc of L sided flank pain. Pt has hx of renal stones and has required lithotripsy and stent placement. No recent hx of stones. Pt reports that her symptoms is consistent with her kidney stone. She has no hx of  diverticulitis, and the abd exam is not showing any concerning focal findings.  Labs are reassuring.  US done - and is shows L sided hydronephrosis with likely renal stone. Pain is 4/10 at the moment, worse than it was when I saw her. We will give patient pain meds and d/c. Strict  ER return precautions have been discussed, and patient is agreeing with the plan and is comfortable with the workup done and the recommendations from the ER.    Final Clinical Impressions(s) / ED Diagnoses   Final diagnoses:  Nephrolithiasis    New Prescriptions New Prescriptions   HYDROCODONE-ACETAMINOPHEN (NORCO/VICODIN) 5-325 MG TABLET    Take 1 tablet by mouth every 6 (six) hours as needed.       Varney Biles, MD 10/01/16 [phone]

## 2016-09-30 NOTE — ED Notes (Signed)
50 mcg of Fentanyl given to pt while in triage.

## 2016-10-01 ENCOUNTER — Emergency Department (HOSPITAL_COMMUNITY): Payer: No Typology Code available for payment source

## 2016-10-01 MED ORDER — HYDROCODONE-ACETAMINOPHEN 5-325 MG PO TABS
1.0000 | ORAL_TABLET | Freq: Once | ORAL | Status: DC
Start: 2016-10-01 — End: 2016-10-01

## 2016-10-01 MED ORDER — HYDROCODONE-ACETAMINOPHEN 5-325 MG PO TABS: 1.0000 | ORAL_TABLET | Freq: Four times a day (QID) | ORAL | 0 refills | Status: DC | PRN

## 2016-10-01 NOTE — ED Notes (Signed)
Pt transported to [facility]

## 2016-10-01 NOTE — Discharge Instructions (Signed)
We saw you in the ER for the abdominal pain. °Our results indicate that you have a kidney stone. °We were able to get your pain is relative control, and we can safely send you home. ° °Take the meds prescribed. °Set up an appointment with the Urologist. °If the pain is unbearable, you start having fevers, chills, and are unable to keep any meds down - then return to the ER. °  °

## 2016-10-01 NOTE — ED Notes (Signed)
ED Provider at bedside. 

## 2016-10-01 NOTE — ED Notes (Signed)
Per [place], transporter on the way to transport pt to [place] at this time.

## 2016-10-21 ENCOUNTER — Encounter (HOSPITAL_COMMUNITY)
Admission: RE | Disposition: A | Discharge status: Home / Self Care | Payer: Self-pay | Source: Ambulatory Visit | Attending: Urology

## 2016-10-21 ENCOUNTER — Ambulatory Visit (HOSPITAL_COMMUNITY): Payer: No Typology Code available for payment source | Admitting: Registered Nurse

## 2016-10-21 ENCOUNTER — Encounter (HOSPITAL_COMMUNITY): Payer: Self-pay | Admitting: *Deleted

## 2016-10-21 ENCOUNTER — Ambulatory Visit: Payer: Self-pay | Admitting: *Deleted

## 2016-10-21 ENCOUNTER — Other Ambulatory Visit: Payer: Self-pay | Admitting: Urology

## 2016-10-21 ENCOUNTER — Ambulatory Visit (HOSPITAL_COMMUNITY): Payer: No Typology Code available for payment source

## 2016-10-21 ENCOUNTER — Ambulatory Visit (HOSPITAL_COMMUNITY)
Admission: RE | Admit: 2016-10-21 | Discharge: 2016-10-21 | Disposition: A | Discharge status: Home / Self Care | Payer: No Typology Code available for payment source | Source: Ambulatory Visit | Attending: Urology | Admitting: Urology

## 2016-10-21 DIAGNOSIS — Z79899 Other long term (current) drug therapy: Secondary | ICD-10-CM | POA: Diagnosis not present

## 2016-10-21 DIAGNOSIS — N201 Calculus of ureter: Secondary | ICD-10-CM

## 2016-10-21 DIAGNOSIS — N132 Hydronephrosis with renal and ureteral calculous obstruction: Secondary | ICD-10-CM | POA: Insufficient documentation

## 2016-10-21 DIAGNOSIS — N23 Unspecified renal colic: Secondary | ICD-10-CM | POA: Insufficient documentation

## 2016-10-21 DIAGNOSIS — E119 Type 2 diabetes mellitus without complications: Secondary | ICD-10-CM

## 2016-10-21 DIAGNOSIS — I10 Essential (primary) hypertension: Secondary | ICD-10-CM | POA: Diagnosis not present

## 2016-10-21 DIAGNOSIS — Z87442 Personal history of urinary calculi: Secondary | ICD-10-CM | POA: Diagnosis not present

## 2016-10-21 DIAGNOSIS — E7800 Pure hypercholesterolemia, unspecified: Secondary | ICD-10-CM | POA: Diagnosis not present

## 2016-10-21 DIAGNOSIS — Z7984 Long term (current) use of oral hypoglycemic drugs: Secondary | ICD-10-CM | POA: Insufficient documentation

## 2016-10-21 HISTORY — PX: CYSTOSCOPY WITH RETROGRADE PYELOGRAM, URETEROSCOPY AND STENT PLACEMENT: SHX5789

## 2016-10-21 HISTORY — DX: Personal history of urinary calculi: Z87.442

## 2016-10-21 HISTORY — DX: Cardiac arrhythmia, unspecified: I49.9

## 2016-10-21 LAB — GLUCOSE, CAPILLARY
Glucose-Capillary: 79 mg/dL (ref 65–99)
Glucose-Capillary: 96 mg/dL (ref 65–99)

## 2016-10-21 LAB — GLUCOSE, POCT (MANUAL RESULT ENTRY): POC Glucose: 101 mg/dl — AB (ref 70–99)

## 2016-10-21 SURGERY — CYSTOURETEROSCOPY, WITH RETROGRADE PYELOGRAM AND STENT INSERTION
Anesthesia: General | Site: Urethra | Laterality: Left

## 2016-10-21 MED ORDER — PROPOFOL 10 MG/ML IV BOLUS
INTRAVENOUS | Status: DC | PRN
  Administered 2016-10-21: 120 mg via INTRAVENOUS
  Administered 2016-10-21: 50 mg via INTRAVENOUS

## 2016-10-21 MED ORDER — CEFAZOLIN SODIUM-DEXTROSE 1-4 GM/50ML-% IV SOLN
1.0000 g | INTRAVENOUS | Status: AC
Start: 2016-10-21 — End: 2016-10-21
  Administered 2016-10-21: 1 g via INTRAVENOUS
  Filled 2016-10-21 (×2): qty 50

## 2016-10-21 MED ORDER — 0.9 % SODIUM CHLORIDE (POUR BTL) OPTIME
TOPICAL | Status: DC | PRN
  Administered 2016-10-21: 1000 mL

## 2016-10-21 MED ORDER — FENTANYL CITRATE (PF) 100 MCG/2ML IJ SOLN
INTRAMUSCULAR | Status: DC | PRN
  Administered 2016-10-21 (×2): 50 ug via INTRAVENOUS

## 2016-10-21 MED ORDER — MIDAZOLAM HCL 5 MG/5ML IJ SOLN
INTRAMUSCULAR | Status: DC | PRN
  Administered 2016-10-21: 2 mg via INTRAVENOUS

## 2016-10-21 MED ORDER — MIDAZOLAM HCL 2 MG/2ML IJ SOLN
INTRAMUSCULAR | Status: AC
  Filled 2016-10-21: qty 2

## 2016-10-21 MED ORDER — FENTANYL CITRATE (PF) 100 MCG/2ML IJ SOLN
INTRAMUSCULAR | Status: AC
Start: 2016-10-21 — End: ?
  Filled 2016-10-21: qty 2

## 2016-10-21 MED ORDER — BELLADONNA ALKALOIDS-OPIUM 16.2-60 MG RE SUPP
RECTAL | Status: DC | PRN
  Administered 2016-10-21: 1 via RECTAL

## 2016-10-21 MED ORDER — SODIUM CHLORIDE 0.9 % IR SOLN
Status: DC | PRN
  Administered 2016-10-21 (×2): 1000 mL via INTRAVESICAL
  Administered 2016-10-21: 3000 mL via INTRAVESICAL

## 2016-10-21 MED ORDER — LIDOCAINE 2% (20 MG/ML) 5 ML SYRINGE
INTRAMUSCULAR | Status: AC
  Filled 2016-10-21: qty 5

## 2016-10-21 MED ORDER — ONDANSETRON HCL 4 MG/2ML IJ SOLN
INTRAMUSCULAR | Status: DC | PRN
  Administered 2016-10-21: 4 mg via INTRAVENOUS

## 2016-10-21 MED ORDER — IOHEXOL 300 MG/ML  SOLN
INTRAMUSCULAR | Status: DC | PRN
Start: 2016-10-21 — End: 2016-10-21
  Administered 2016-10-21: 5 mL via URETHRAL

## 2016-10-21 MED ORDER — ONDANSETRON HCL 8 MG PO TABS: 8.0000 mg | ORAL_TABLET | Freq: Three times a day (TID) | ORAL | 0 refills | Status: DC | PRN

## 2016-10-21 MED ORDER — LIDOCAINE 2% (20 MG/ML) 5 ML SYRINGE
INTRAMUSCULAR | Status: DC | PRN
  Administered 2016-10-21: 60 mg via INTRAVENOUS

## 2016-10-21 MED ORDER — HYDROCODONE-ACETAMINOPHEN 7.5-325 MG PO TABS: 1.0000 | ORAL_TABLET | Freq: Once | ORAL | Status: DC | PRN

## 2016-10-21 MED ORDER — LACTATED RINGERS IV SOLN
INTRAVENOUS | Status: DC
  Administered 2016-10-21: 16:00:00 via INTRAVENOUS

## 2016-10-21 MED ORDER — ACETAMINOPHEN 10 MG/ML IV SOLN
INTRAVENOUS | Status: AC
  Filled 2016-10-21: qty 100

## 2016-10-21 MED ORDER — ONDANSETRON HCL 4 MG/2ML IJ SOLN
INTRAMUSCULAR | Status: AC
  Filled 2016-10-21: qty 2

## 2016-10-21 MED ORDER — MEPERIDINE HCL 50 MG/ML IJ SOLN: 6.2500 mg | INTRAMUSCULAR | Status: DC | PRN

## 2016-10-21 MED ORDER — TRAMADOL-ACETAMINOPHEN 37.5-325 MG PO TABS
1.0000 | ORAL_TABLET | Freq: Four times a day (QID) | ORAL | 0 refills | Status: DC | PRN
Start: 2016-10-21 — End: 2016-12-02

## 2016-10-21 MED ORDER — DEXAMETHASONE SODIUM PHOSPHATE 10 MG/ML IJ SOLN
INTRAMUSCULAR | Status: AC
  Filled 2016-10-21: qty 1

## 2016-10-21 MED ORDER — HYDROMORPHONE HCL 1 MG/ML IJ SOLN: 0.2500 mg | INTRAMUSCULAR | Status: DC | PRN

## 2016-10-21 MED ORDER — PROMETHAZINE HCL 25 MG/ML IJ SOLN: 6.2500 mg | INTRAMUSCULAR | Status: DC | PRN

## 2016-10-21 MED ORDER — PROPOFOL 10 MG/ML IV BOLUS
INTRAVENOUS | Status: AC
  Filled 2016-10-21: qty 20

## 2016-10-21 MED ORDER — PHENAZOPYRIDINE HCL 200 MG PO TABS: 200.0000 mg | ORAL_TABLET | Freq: Three times a day (TID) | ORAL | 0 refills | Status: DC | PRN

## 2016-10-21 MED ORDER — DEXAMETHASONE SODIUM PHOSPHATE 10 MG/ML IJ SOLN
INTRAMUSCULAR | Status: DC | PRN
  Administered 2016-10-21: 5 mg via INTRAVENOUS

## 2016-10-21 MED ORDER — BELLADONNA ALKALOIDS-OPIUM 16.2-60 MG RE SUPP
RECTAL | Status: AC
  Filled 2016-10-21: qty 1

## 2016-10-21 MED ORDER — ACETAMINOPHEN 10 MG/ML IV SOLN
INTRAVENOUS | Status: DC | PRN
  Administered 2016-10-21: 1000 mg via INTRAVENOUS

## 2016-10-21 SURGICAL SUPPLY — 19 items
BAG URO CATCHER STRL LF (MISCELLANEOUS) ×2 IMPLANT
BASKET STNLS GEMINI 4WIRE 3FR (BASKET) IMPLANT
BASKET ZERO TIP NITINOL 2.4FR (BASKET) ×2 IMPLANT
CATH INTERMIT  6FR 70CM (CATHETERS) ×2 IMPLANT
CATH URET DUAL LUMEN 6-10FR 50 (CATHETERS) ×2 IMPLANT
CLOTH BEACON ORANGE TIMEOUT ST (SAFETY) ×2 IMPLANT
COVER SURGICAL LIGHT HANDLE (MISCELLANEOUS) ×2 IMPLANT
FIBER LASER FLEXIVA 365 (UROLOGICAL SUPPLIES) IMPLANT
FIBER LASER TRAC TIP (UROLOGICAL SUPPLIES) IMPLANT
GLOVE BIOGEL M STRL SZ7.5 (GLOVE) ×2 IMPLANT
GOWN STRL REUS W/TWL XL LVL3 (GOWN DISPOSABLE) ×2 IMPLANT
GUIDEWIRE STR DUAL SENSOR (WIRE) ×4 IMPLANT
IV NS 1000ML (IV SOLUTION) ×1
IV NS 1000ML BAXH (IV SOLUTION) ×1 IMPLANT
MANIFOLD NEPTUNE II (INSTRUMENTS) ×2 IMPLANT
PACK CYSTO (CUSTOM PROCEDURE TRAY) ×2 IMPLANT
SHEATH ACCESS URETERAL 24CM (SHEATH) ×2 IMPLANT
STENT POLARIS 5FRX24 (STENTS) ×2 IMPLANT
TUBING CONNECTING 10 (TUBING) ×2 IMPLANT

## 2016-10-21 NOTE — Anesthesia Preprocedure Evaluation (Signed)
Anesthesia Evaluation  Patient identified by MRN, date of birth, ID band Patient awake    Reviewed: Allergy & Precautions, NPO status , Patient's Chart, lab work & pertinent test results  Airway Mallampati: II  TM Distance: >3 FB Neck ROM: Full    Dental no notable dental hx. (+) Teeth Intact   Pulmonary neg pulmonary ROS,    Pulmonary exam normal breath sounds clear to auscultation       Cardiovascular hypertension, Pt. on medications Normal cardiovascular exam+ dysrhythmias Supra Ventricular Tachycardia  Rhythm:Regular Rate:Normal     Neuro/Psych negative neurological ROS  negative psych ROS   GI/Hepatic GERD  Controlled and Medicated,  Endo/Other  diabetes, Well Controlled, Type 2, Oral Hypoglycemic AgentsHyperlipidemia  Renal/GU Renal diseaseLeft ureteral calculus  negative genitourinary   Musculoskeletal negative musculoskeletal ROS (+)   Abdominal (+) - obese,   Peds  Hematology negative hematology ROS (+)   Anesthesia Other Findings   Reproductive/Obstetrics                             Anesthesia Physical Anesthesia Plan  ASA: II  Anesthesia Plan: General   Post-op Pain Management:    Induction: Intravenous  Airway Management Planned: LMA  Additional Equipment:   Intra-op Plan:   Post-operative Plan: Extubation in OR  Informed Consent: I have reviewed the patients History and Physical, chart, labs and discussed the procedure including the risks, benefits and alternatives for the proposed anesthesia with the patient or authorized representative who has indicated his/her understanding and acceptance.   Dental advisory given  Plan Discussed with: Anesthesiologist, CRNA and Surgeon  Anesthesia Plan Comments:         Anesthesia Quick Evaluation

## 2016-10-21 NOTE — Op Note (Signed)
Pre-operative diagnosis : Left renal colic with Left lower pelvic calcification  Postoperative diagnosis:  Same.  Operation: cystoscopy, left retrograde pyelogram, left semirigid ureteroscopy, left flexible ureteroscoopy, pyeloscopy, and placement of left JJ stent with suture attachment.  Surgeon:  Chauncey Cruel. Gaynelle Arabian, MD  First assistant: none  Anesthesia:  General LMA  Preparation: After appropriate preanesthesia, the patient was brought to the operative room, placed on the operating table in the dorsal supine position where general LMA anesthesia was introduced. He was then replaced in the dorsal lithotomy position with pubis was prepped with Betadine solution and draped in usual fashion. B and O suppository was placed. The history was here. CT scan was reviewed. Double the armband was double checked.   Review history:  Laurie Tran is a 71 year-old female established patient who is here for flank pain.  Patient with previous history or renal calculi. She presented approximately 2 weeks ago with complaints of left flank pain that had been ongoing for one week prior. She was evaluated in the ED the day prior to her visit and a renal ultrasound was done which showed mild left hydronephrosis. KUB done in clinic showed probable distal ureteral calculi. She was started on MET. Today she presents for follow up. She has been using Rapaflo,as prescribed and has not seen a stone pass. She continues to have intermittent left flank pain. She also complains today of increased irritative voiding symptoms, including frequency and urgency. She also states that she feels it is difficult to void at times. She denies any fever, nausea, or vomiting. She is diabetic. She denies use of any anticoagulants.    Statement of  Likelihood of Success: Excellent. TIME-OUT observed.:  Procedure: CT scan showed calcification in the area of the left ureterovesical junction, consistent with the patient's symptoms. Left retrograde  pyelogram, however, showed the calcification to be outside the ureter. The ureter itself appeared to be normal. A 0.038 floppy tip guidewire was passed through the cystoscope, and through a 5 [place] open-ended catheter, and coiled in the renal pelvis.    Further retrograde Pyelyogram showed normal mid ureter and normal upper ureter. The semirigid ureteroscope was placed without difficulty, and the ureteral orifice and ureteral tunnel were both normal. Ureterovesical junction was normal and the lower ureter was also normal. The mid ureter and upper ureter were normal. Pyeloscopy appeared to be within normal limits, although there was some blood clot within the renal pelvis. A second guidewire was passed through the semirigid ureteroscope, and ureteroscope was removed.   The ureteral access sheath, short, was passed without difficulty over one of the guidewires, and the guidewire and inner core of the access sheath was removed. The flexible ureteroscope was then passed through the sheath into the upper ureter and the renal pelvis. Blood clot was identified within renal pelvis, and a very tiny stone was identified, which was stuck to the kidney, and could not be removed because of the angle of the scope. A 0 tip basket was passed, but the stone could not be dislodged. It was photographed, however.  Repeat ureteroscopy was accomplished to assure that there was no stone within the ureter, and the scope was removed. The 0.038 guidewire was then back loaded through the cystoscope, and a 5 [place] by 24 cm Polaris double J stent was passed without difficulty. It was coiled in the kidney under fluoroscopic control. The suture was left attached, and brought out through the urethra, and taped to the pubis.  The patient was then  awakened and taken to recovery room in good condition. Because of her allergy to NSAIDs, she was not given any Toradol. She was covered with antibiotic for the procedure.

## 2016-10-21 NOTE — Discharge Instructions (Signed)
Kidney Stones Kidney stones (urolithiasis) are rock-like masses that form inside of the kidneys. Kidneys are organs that make pee (urine). A kidney stone can cause very bad pain and can block the flow of pee. The stone usually leaves your body (passes) through your pee. You may need to have a doctor take out the stone. Follow these instructions at home: Eating and drinking   Drink enough fluid to keep your pee clear or pale yellow. This will help you pass the stone.  If told by your doctor, change the foods you eat (your diet). This may include:  Limiting how much salt (sodium) you eat.  Eating more fruits and vegetables.  Limiting how much meat, poultry, fish, and eggs you eat.  Follow instructions from your doctor about eating or drinking restrictions. General instructions   Collect pee samples as told by your doctor. You may need to collect a pee sample:  24 hours after a stone comes out.  8-12 weeks after a stone comes out, and every 6-12 months after that.  Strain your pee every time you pee (urinate), for as long as told. Use the strainer that your doctor recommends.  Do not throw out the stone. Keep it so that it can be tested by your doctor.  Take over-the-counter and prescription medicines only as told by your doctor.  Keep all follow-up visits as told by your doctor. This is important. You may need follow-up tests. Preventing kidney stones  To prevent another kidney stone:  Drink enough fluid to keep your pee clear or pale yellow. This is the best way to prevent kidney stones.  Eat healthy foods.  Avoid certain foods as told by your doctor. You may be told to eat less protein.  Stay at a healthy weight. Contact a doctor if:  You have pain that gets worse or does not get better with medicine. Get help right away if:  You have a fever or chills.  You get very bad pain.  You get new pain in your belly (abdomen).  You pass out (faint).  You cannot  pee. This information is not intended to replace advice given to you by your health care provider. Make sure you discuss any questions you have with your health care provider. Document Released: 10/29/2007 Document Revised: 01/29/2016 Document Reviewed: 01/29/2016 Elsevier Interactive Patient Education  2017 [place]. Dietary Guidelines to Help Prevent Kidney Stones Kidney stones are deposits of minerals and salts that form inside your kidneys. Your risk of developing kidney stones may be greater depending on your diet, your lifestyle, the medicines you take, and whether you have certain medical conditions. Most people can reduce their chances of developing kidney stones by following the instructions below. Depending on your overall health and the type of kidney stones you tend to develop, your dietitian may give you more specific instructions. What are tips for following this plan? Reading food labels   Choose foods with "no salt added" or "low-salt" labels. Limit your sodium intake to less than 1500 mg per day.  Choose foods with calcium for each meal and snack. Try to eat about 300 mg of calcium at each meal. Foods that contain 200-500 mg of calcium per serving include:  8 oz (237 ml) of milk, fortified nondairy milk, and fortified fruit juice.  8 oz (237 ml) of kefir, yogurt, and soy yogurt.  4 oz (118 ml) of tofu.  1 oz of cheese.  1 cup (300 g) of dried figs.  1 cup (91 g) of cooked broccoli.  1-3 oz can of sardines or mackerel.  Most people need 1000 to 1500 mg of calcium each day. Talk to your dietitian about how much calcium is recommended for you. Shopping   Buy plenty of fresh fruits and vegetables. Most people do not need to avoid fruits and vegetables, even if they contain nutrients that may contribute to kidney stones.  When shopping for convenience foods, choose:  Whole pieces of fruit.  Premade salads with dressing on the side.  Low-fat fruit and yogurt  smoothies.  Avoid buying frozen meals or prepared deli foods.  Look for foods with live cultures, such as yogurt and kefir. Cooking   Do not add salt to food when cooking. Place a salt shaker on the table and allow each person to add his or her own salt to taste.  Use vegetable protein, such as beans, textured vegetable protein (TVP), or tofu instead of meat in pasta, casseroles, and soups. Meal planning   Eat less salt, if told by your dietitian. To do this:  Avoid eating processed or premade food.  Avoid eating fast food.  Eat less animal protein, including cheese, meat, poultry, or fish, if told by your dietitian. To do this:  Limit the number of times you have meat, poultry, fish, or cheese each week. Eat a diet free of meat at least 2 days a week.  Eat only one serving each day of meat, poultry, fish, or seafood.  When you prepare animal protein, cut pieces into small portion sizes. For most meat and fish, one serving is about the size of one deck of cards.  Eat at least 5 servings of fresh fruits and vegetables each day. To do this:  Keep fruits and vegetables on hand for snacks.  Eat 1 piece of fruit or a handful of berries with breakfast.  Have a salad and fruit at lunch.  Have two kinds of vegetables at dinner.  Limit foods that are high in a substance called oxalate. These include:  Spinach.  Rhubarb.  Beets.  Potato chips and french fries.  Nuts.  If you regularly take a diuretic medicine, make sure to eat at least 1-2 fruits or vegetables high in potassium each day. These include:  Avocado.  Banana.  Orange, prune, carrot, or tomato juice.  Baked potato.  Cabbage.  Beans and split peas. General instructions   Drink enough fluid to keep your urine clear or pale yellow. This is the most important thing you can do.  Talk to your health care provider and dietitian about taking daily supplements. Depending on your health and the cause of your  kidney stones, you may be advised:  Not to take supplements with vitamin C.  To take a calcium supplement.  To take a daily probiotic supplement.  To take other supplements such as magnesium, fish oil, or vitamin B6.  Take all medicines and supplements as told by your health care provider.  Limit alcohol intake to no more than 1 drink a day for nonpregnant women and 2 drinks a day for men. One drink equals 12 oz of beer, 5 oz of wine, or 1 oz of hard liquor.  Lose weight if told by your health care provider. Work with your dietitian to find strategies and an eating plan that works best for you. What foods are not recommended? Limit your intake of the following foods, or as told by your dietitian. Talk to your dietitian about specific  foods you should avoid based on the type of kidney stones and your overall health. Grains  Breads. Bagels. Rolls. Baked goods. Salted crackers. Cereal. Pasta. Vegetables  Spinach. Rhubarb. Beets. Canned vegetables. Angie Fava. Olives. Meats and other protein foods  Nuts. Nut butters. Large portions of meat, poultry, or fish. Salted or cured meats. Deli meats. Hot dogs. Sausages. Dairy  Cheese. Beverages  Regular soft drinks. Regular vegetable juice. Seasonings and other foods  Seasoning blends with salt. Salad dressings. Canned soups. Soy sauce. Ketchup. Barbecue sauce. Canned pasta sauce. Casseroles. Pizza. Lasagna. Frozen meals. Potato chips. [place] fries. Summary  You can reduce your risk of kidney stones by making changes to your diet.  The most important thing you can do is drink enough fluid. You should drink enough fluid to keep your urine clear or pale yellow.  Ask your health care provider or dietitian how much protein from animal sources you should eat each day, and also how much salt and calcium you should have each day. This information is not intended to replace advice given to you by your health care provider. Make sure you discuss any  questions you have with your health care provider. Document Released: 09/06/2010 Document Revised: 04/22/2016 Document Reviewed: 04/22/2016 Elsevier Interactive Patient Education  2017 [place] may remove JJ stent in 3 days by pulling the suture to remove the stent completely and discarding it.

## 2016-10-21 NOTE — Transfer of Care (Signed)
Immediate Anesthesia Transfer of Care Note  Patient: Laurie Tran  Procedure(s) Performed: Procedure(s): CYSTOSCOPY WITH RETROGRADE PYELOGRAM, LEFT URETEROSCOPY PYELOSCOPY  AND LEFT STENT PLACEMENT (Left)  Patient Location: PACU  Anesthesia Type:General  Level of Consciousness: awake, alert  and oriented  Airway & Oxygen Therapy: Patient Spontanous Breathing and Patient connected to face mask oxygen  Post-op Assessment: Report given to RN and Post -op Vital signs reviewed and stable  Post vital signs: Reviewed and stable  Last Vitals:  Vitals:   10/21/16 1526  BP: (!) 150/89  Pulse: 85  Resp: 18  Temp: 37.4 C    Last Pain:  Vitals:   10/21/16 1607  TempSrc:   PainSc: 0-No pain         Complications: No apparent anesthesia complications

## 2016-10-21 NOTE — H&P (Signed)
Office Visit Report 10/16/2016    Laurie Man. [place]           PRIMARY CARE:  Loura Pardon, MD  DOB: 01-Jun-1945, 71 year old Female  REFERRING:      PROVIDER:  Carolan Clines, M.D.    TREATING:  [name]    LOCATION:  [facility] [phone]    CC: I have pain in the flank.  HPI: Laurie Tran is a 71 year-old female established patient who is here for flank pain.  Patient with previous history or renal calculi. She presented approximately 2 weeks ago with complaints of left flank pain that had been ongoing for one week prior. She was evaluated in the ED the day prior to her visit and a renal ultrasound was done which showed mild left hydronephrosis. KUB done in clinic showed probable distal ureteral calculi. She was started on MET. Today she presents for follow up. She has been using Rapaflo,as prescribed and has not seen a stone pass. She continues to have intermittent left flank pain. She also complains today of increased irritative voiding symptoms, including frequency and urgency. She also states that she feels it is difficult to void at times. She denies any fever, nausea, or vomiting. She is diabetic. She denies use of any anticoagulants.    The problem is on the left side. Her pain started about approximately 09/24/2016. The pain is sharp. The pain is intermittent. The pain does radiate.   None< makes the pain better. Full bladder makes the pain worse. She was treated with the following pain medication(s): Percocet.   She has had this same pain previously. She has had kidney stones.     ALLERGIES: Bextra TABS [employer] Ibuprofen CAPS NSAIDs - Hives, Bronchitis    MEDICATIONS: Benadryl  Biotin  Fexofenadine HCl - 180 MG Oral Tablet Oral  Fish Oil  Hydrochlorothiazide 25 mg tablet 0 Oral  Lipitor 10 MG Oral Tablet Oral  MetFORMIN HCl - 500 MG Oral Tablet Oral  Nasonex SUSP Nasal  Vitamin D3     GU PSH: ESWL - 2012, 2012      National Notes:  Lithotripsy, Cystourethroscopy, Lithotripsy, Breast Surgery   NON-GU PSH: Breast Surgery Procedure - 2012        GU PMH: Flank Pain - 10/01/2016 Renal calculus - 04/02/2016 Urinary Frequency - 04/02/2016 Benign neoplasm of unspecified ovary, Dermoid cyst of ovary - 2014 History of urolithiasis, Nephrolithiasis - 2014      PMH Notes:  1898-05-26 00:00:00 - Note: Normal Routine History And Physical Adult  2011-07-22 08:24:09 - Note: Nephrolithiasis Of The Right Kidney  2010-11-21 15:52:19 - Note: Arthritis   NON-GU PMH: Cardiac murmur, unspecified, Murmurs - 2014 Personal history of other diseases of the circulatory system, History of hypertension - 2014 Personal history of other endocrine, nutritional and metabolic disease, History of diabetes mellitus - 2014, History of hypercholesterolemia, - 2014 Personal history of other specified conditions, History of heartburn - 2014 Heartburn    FAMILY HISTORY: 1 Daughter - Other 1 son - Other Acute Lymphoma - Mother Breast Cancer - Mother, Sister Colon Cancer - Brother father deceased at age 65 - Other patient's mother is still living - Other    Notes: Mother is 25  Father died in a traffic accident   SOCIAL HISTORY: Marital Status: Married Current Smoking Status: Patient has never smoked.  Has never drank.  Does not drink caffeine. Patient's occupation TEFL teacher.  REVIEW OF SYSTEMS:     GU Review Female:  Patient reports frequent urination, burning /pain with urination, get up at night to urinate, stream starts and stops, and have to strain to urinate. Patient denies hard to postpone urination, leakage of urine, trouble starting your stream, and being pregnant.    Gastrointestinal (Upper):  Patient denies vomiting, nausea, and indigestion/ heartburn.    Gastrointestinal (Lower):  Patient denies diarrhea and constipation.    Constitutional:  Patient denies fever, night sweats, weight loss, and fatigue.    Skin:  Patient denies  skin rash/ lesion and itching.    Eyes:  Patient denies blurred vision and double vision.    Ears/ Nose/ Throat:  Patient denies sore throat and sinus problems.    Hematologic/Lymphatic:  Patient denies swollen glands and easy bruising.    Cardiovascular:  Patient denies leg swelling and chest pains.    Respiratory:  Patient denies cough and shortness of breath.    Endocrine:  Patient denies excessive thirst.    Musculoskeletal:  Patient denies back pain and joint pain.    Neurological:  Patient denies headaches and dizziness.    Psychologic:  Patient denies depression and anxiety.    VITAL SIGNS:       10/16/2016 08:22 AM     Weight 124 lb / 56.25 kg     Height 64.75 in / 164.46 cm     BP 159/90 mmHg     Pulse 69 /min     Temperature 98.3 F / 37 C     BMI 20.8 kg/m     MULTI-SYSTEM PHYSICAL EXAMINATION:      Constitutional: Well-nourished. No physical deformities. Normally developed. Good grooming.     Respiratory: No labored breathing, no use of accessory muscles. CTA bilaterally.      Cardiovascular: Normal temperature, no swelling, no varicosities, regular rate and rhythm.      Skin: No paleness, no jaundice, no cyanosis. No lesion, no ulcer, no rash.     Neurologic / Psychiatric: Oriented to time, oriented to place, oriented to person. No depression, no anxiety, no agitation.     Gastrointestinal: No mass, no tenderness, no rigidity, non obese abdomen.     Musculoskeletal: Spine, ribs, pelvis no bilateral tenderness. Normal gait and station of head and neck.            PAST DATA REVIEWED:   Source Of History:  Patient  Records Review:  Previous Patient Records  Urine Test Review:  Urinalysis, Urine Culture  X-Ray Review: KUB: Reviewed Films.  Renal Ultrasound: Reviewed Films.     PROCEDURES:    C.T. Urogram - [id number]  Per radiology report: 3 mm nonobstructive calculus in the  interpolar collecting system of the left kidney. Additional 3 mm and  6 mm calculi are present  at the left ureterovesicular junction. No additional calculi are noted within  the right renal collecting system, along the course of the right  ureter, or within the lumen of the urinary bladder. There is no  associated hydroureteronephrosis at this time. Unenhanced appearance  of the kidneys and urinary bladder is otherwise unremarkable. 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 Right adrenal gland is normal in appearance.            Urinalysis w/Scope  Dipstick Dipstick Cont'd Micro  Color: Straw Bilirubin: Neg WBC/hpf: 0 - 5/hpf  Appearance: Clear Ketones: Neg RBC/hpf: 0 - 2/hpf  Specific Gravity: 1.010  Blood: 2+ Bacteria: NS (Not Seen)  pH: 6.5 Protein: Neg Cystals: NS (Not Seen)  Glucose: Neg Urobilinogen: 0.2 Casts: NS (Not Seen)   Nitrites: Neg Trichomonas: Not Present   Leukocyte Esterase: Neg Mucous: Not Present    Epithelial Cells: NS (Not Seen)    Yeast: NS (Not Seen)    Sperm: Not Present    ASSESSMENT:     ICD-10 Details  1 GU:  Flank Pain - R10.84   2  Ureteral calculus - N20.1 Left, Worsening   Notes:  Urinalysis today is not concerning for infection. Given her continued symptoms, I will proceed with C.T imaging today. Imaging shows an approximately 1-2 mm and 5 mm stone at her left UVJ. There is no significant hydronephrosis noted. I provided additional samples of Rapaflo today. We again discussed the risks and benefits in detail. I will discuss the case with her urologist. Given the length of time she has been symptomatic, she would like to proceed with intervention at this point in time. We discussed the indications, risks, and benefits of URS iin great detail. She voices understanding. I will be in touch with recommendations given by her urologist. Strict return precautions were given for fever, progressive pain, nausea, or vomiting. She may follow up sooner, if needed.     PLAN:   Orders  Labs Urine Culture   X-Rays: C.T. Stone Protocol Without Contrast - low dose  Schedule  Return Visit/Planned Activity: Other See Visit Notes  Document  Letter(s):  Created for Patient: Clinical Summary    Signed by [name] on 10/18/16 at 12:40 PM (EDT)     APPENDED NOTES:  Case discussed with the patients urologist. She will be scheduled for left URS on Tuesday 5/29. We discussing indications, risks, and benefits of this procedure in great detail. She voiced understanding. I called and discussed NPO status with the patient. Scheduling will be in touch with her for further follow up. She may call with any questions or concerns. Signed by [name] on 10/18/16 at 12:42 PM (EDT)    The information contained in this medical record document is considered private and confidential patient information. This information can only be used for the medical diagnosis and/or medical services that are being provided by the patient's selected caregivers. This information can only be distributed outside of the patient's care if the patient agrees and signs waivers of authorization for this information to be sent to an outside source or route.

## 2016-10-21 NOTE — Progress Notes (Signed)
Pt has procedure for kidney stone this evening and took her Metformin and was told by surgery center to have cbg checked this am. 101 this am. Instructed to drink 20oz Sprite by NP.

## 2016-10-21 NOTE — Interval H&P Note (Signed)
History and Physical Interval Note:  10/21/2016 5:44 PM  Laurie Tran  has presented today for surgery, with the diagnosis of left ureteral obstruction  The various methods of treatment have been discussed with the patient and family. After consideration of risks, benefits and other options for treatment, the patient has consented to  Procedure(s): CYSTOSCOPY WITH RETROGRADE PYELOGRAM, LEFT URETEROSCOPY AND LEFT STENT PLACEMENT AND HOLMIUM LASER (Left) as a surgical intervention .  The patient's history has been reviewed, patient examined, no change in status, stable for surgery.  I have reviewed the patient's chart and labs.  Questions were answered to the patient's satisfaction.     Davian Wollenberg I Yuna Pizzolato

## 2016-10-21 NOTE — Anesthesia Postprocedure Evaluation (Signed)
Anesthesia Post Note  Patient: Laurie Tran  Procedure(s) Performed: Procedure(s) (LRB): CYSTOSCOPY WITH RETROGRADE PYELOGRAM, LEFT URETEROSCOPY PYELOSCOPY  AND LEFT STENT PLACEMENT (Left)  Patient location during evaluation: PACU Anesthesia Type: General Level of consciousness: awake and alert Pain management: pain level controlled Vital Signs Assessment: post-procedure vital signs reviewed and stable Respiratory status: spontaneous breathing, nonlabored ventilation and respiratory function stable Cardiovascular status: blood pressure returned to baseline and stable Postop Assessment: no signs of nausea or vomiting Anesthetic complications: no       Last Vitals:  Vitals:   10/21/16 2015 10/21/16 2030  BP: (!) 153/69 (!) 143/83  Pulse: 63 66  Resp: 15 17  Temp:  36.4 C    Last Pain:  Vitals:   10/21/16 2030  TempSrc:   PainSc: 1                  Fizza Scales A.

## 2016-10-21 NOTE — Anesthesia Procedure Notes (Signed)
Procedure Name: LMA Insertion Date/Time: 10/21/2016 5:54 PM Performed by: Talbot Grumbling Pre-anesthesia Checklist: Patient identified, Emergency Drugs available, Patient being monitored and Suction available Patient Re-evaluated:Patient Re-evaluated prior to inductionOxygen Delivery Method: Circle system utilized Preoxygenation: Pre-oxygenation with 100% oxygen Intubation Type: IV induction Ventilation: Mask ventilation without difficulty LMA: LMA inserted LMA Size: 4.0 Number of attempts: 1 Placement Confirmation: positive ETCO2 and breath sounds checked- equal and bilateral Tube secured with: Tape Dental Injury: Teeth and Oropharynx as per pre-operative assessment

## 2016-10-22 ENCOUNTER — Encounter (HOSPITAL_COMMUNITY): Payer: Self-pay | Admitting: Urology

## 2016-10-27 ENCOUNTER — Encounter (HOSPITAL_COMMUNITY): Payer: Self-pay | Admitting: Urology

## 2016-11-29 ENCOUNTER — Other Ambulatory Visit: Payer: Self-pay | Admitting: Family Medicine

## 2016-12-02 ENCOUNTER — Ambulatory Visit: Payer: Self-pay | Admitting: Registered Nurse

## 2016-12-02 VITALS — BP 143/91 | HR 87 | Temp 98.2°F

## 2016-12-02 DIAGNOSIS — L255 Unspecified contact dermatitis due to plants, except food: Secondary | ICD-10-CM

## 2016-12-02 DIAGNOSIS — E119 Type 2 diabetes mellitus without complications: Secondary | ICD-10-CM

## 2016-12-02 MED ORDER — PREDNISONE 10 MG (21) PO TBPK: ORAL_TABLET | ORAL | 0 refills | Status: DC

## 2016-12-02 NOTE — Patient Instructions (Signed)
Poison Ivy Dermatitis Poison ivy dermatitis is inflammation of the skin that is caused by the allergens on the leaves of the poison ivy plant. The skin reaction often involves redness, swelling, blisters, and extreme itching. What are the causes? This condition is caused by a specific chemical (urushiol) found in the sap of the poison ivy plant. This chemical is sticky and can be easily spread to people, animals, and objects. You can get poison ivy dermatitis by:  Having direct contact with a poison ivy plant.  Touching animals, other people, or objects that have come in contact with poison ivy and have the chemical on them.  What increases the risk? This condition is more likely to develop in:  People who are outdoors often.  People who go outdoors without wearing protective clothing, such as closed shoes, long pants, and a long-sleeved shirt.  What are the signs or symptoms? Symptoms of this condition include:  Redness and itching.  A rash that often includes bumps and blisters. The rash usually appears 48 hours after exposure.  Swelling. This may occur if the reaction is more severe.  Symptoms usually last for 1-2 weeks. However, the first time you develop this condition, symptoms may last 3-4 weeks. How is this diagnosed? This condition may be diagnosed based on your symptoms and a physical exam. Your health care provider may also ask you about any recent outdoor activity. How is this treated? Treatment for this condition will vary depending on how severe it is. Treatment may include:  Hydrocortisone creams or calamine lotions to relieve itching.  Oatmeal baths to soothe the skin.  Over-the-counter antihistamine tablets.  Oral steroid medicine for more severe outbreaks.  Follow these instructions at home:  Take or apply over-the-counter and prescription medicines only as told by your health care provider.  Wash exposed skin as soon as possible with soap and cold  water.  Use hydrocortisone creams or calamine lotion as needed to soothe the skin and relieve itching.  Take oatmeal baths as needed. Use colloidal oatmeal. You can get this at your local pharmacy or grocery store. Follow the instructions on the packaging.  Do not scratch or rub your skin.  While you have the rash, wash clothes right after you wear them. How is this prevented?  Learn to identify the poison ivy plant and avoid contact with the plant. This plant can be recognized by the number of leaves. Generally, poison ivy has three leaves with flowering branches on a single stem. The leaves are typically glossy, and they have jagged edges that come to a point at the front.  If you have been exposed to poison ivy, thoroughly wash with soap and water right away. You have about 30 minutes to remove the plant resin before it will cause the rash. Be sure to wash under your fingernails because any plant resin there will continue to spread the rash.  When hiking or camping, wear clothes that will help you to avoid exposure on the skin. This includes long pants, a long-sleeved shirt, tall socks, and hiking boots. You can also apply preventive lotion to your skin to help limit exposure.  If you suspect that your clothes or outdoor gear came in contact with poison ivy, rinse them off outside with a garden hose before you bring them inside your house. Contact a health care provider if:  You have open sores in the rash area.  You have more redness, swelling, or pain in the affected area.  You have   redness that spreads beyond the rash area.  You have fluid, blood, or pus coming from the affected area.  You have a fever.  You have a rash over a large area of your body.  You have a rash on your eyes, mouth, or genitals.  Your rash does not improve after a few days. Get help right away if:  Your face swells or your eyes swell shut.  You have trouble breathing.  You have trouble  swallowing. This information is not intended to replace advice given to you by your health care provider. Make sure you discuss any questions you have with your health care provider. Document Released: 05/09/2000 Document Revised: 10/18/2015 Document Reviewed: 10/18/2014 Elsevier Interactive Patient Education  2018 [employer]  

## 2016-12-02 NOTE — Progress Notes (Signed)
Subjective:    Patient ID: Laurie Tran, female    DOB: June 16, 1945, 71 y.o.   MRN: [id number]  70y/o caucasian female established patient reports exposure to poison oak/ivy 6 days ago with subsequent rash that began draining 2 days ago. Currently wrapped in gauze. Has been using cortisone, calamine and benadryl with short term relief. Sites to bil FA, and bil legs/thighs/calves.  Has also been cleaning with hydrogen peroxide and rubbing alcohol to prevent infection twice a day.  Drainage clear yellow/weeping from blisters.      Review of Systems  Constitutional: Negative for activity change, appetite change, chills, diaphoresis, fatigue and fever.  HENT: Negative for congestion, ear pain and sore throat.   Eyes: Negative for pain and discharge.  Respiratory: Negative for cough and wheezing.   Cardiovascular: Negative for chest pain and leg swelling.  Gastrointestinal: Negative for blood in stool, constipation, diarrhea, nausea and vomiting.  Endocrine: Negative for cold intolerance and heat intolerance.  Genitourinary: Negative for difficulty urinating, dysuria and hematuria.  Musculoskeletal: Negative for arthralgias, back pain and myalgias.  Skin: Positive for color change, rash and wound. Negative for pallor.  Allergic/Immunologic: Positive for environmental allergies. Negative for food allergies.  Neurological: Negative for dizziness, tremors, weakness, numbness and headaches.  Hematological: Negative for adenopathy. Does not bruise/bleed easily.  Psychiatric/Behavioral: Negative for agitation, confusion and sleep disturbance. The patient is not nervous/anxious.        Objective:   Physical Exam  Constitutional: She is oriented to person, place, and time. Vital signs are normal. She appears well-developed and well-nourished. She is active and cooperative.  Non-toxic appearance. She does not have a sickly appearance. She does not appear ill. No distress.  HENT:  Head:  Normocephalic and atraumatic.  Right Ear: Hearing, external ear and ear canal normal. A middle ear effusion is present.  Left Ear: Hearing, external ear and ear canal normal. A middle ear effusion is present.  Nose: Nose normal. No mucosal edema, rhinorrhea, nose lacerations, nasal deformity, septal deviation or nasal septal hematoma. No epistaxis.  No foreign bodies.  Mouth/Throat: Uvula is midline and mucous membranes are normal. Mucous membranes are not pale, not dry and not cyanotic. She does not have dentures. No oral lesions. No trismus in the jaw. Normal dentition. No dental abscesses, uvula swelling, lacerations or dental caries. No oropharyngeal exudate, posterior oropharyngeal edema, posterior oropharyngeal erythema or tonsillar abscesses.  Eyes: Conjunctivae, EOM and lids are normal. Pupils are equal, round, and reactive to light. Right eye exhibits no chemosis, no discharge, no exudate and no hordeolum. No foreign body present in the right eye. Left eye exhibits no chemosis, no discharge, no exudate and no hordeolum. No foreign body present in the left eye. Right conjunctiva is not injected. Right conjunctiva has no hemorrhage. Left conjunctiva is not injected. Left conjunctiva has no hemorrhage. No scleral icterus. Right eye exhibits normal extraocular motion and no nystagmus. Left eye exhibits normal extraocular motion and no nystagmus. Right pupil is round and reactive. Left pupil is round and reactive. Pupils are equal.  Neck: Trachea normal, normal range of motion and phonation normal. Neck supple. No tracheal tenderness, no spinous process tenderness and no muscular tenderness present. No neck rigidity. No tracheal deviation, no edema, no erythema and normal range of motion present. No thyroid mass and no thyromegaly present.  Cardiovascular: Normal rate, regular rhythm, S1 normal, S2 normal, normal heart sounds and intact distal pulses.  PMI is not displaced.  Exam reveals no gallop  and no  friction rub.   No murmur heard. Pulses:      Radial pulses are 2+ on the right side, and 2+ on the left side.  Pulmonary/Chest: Effort normal and breath sounds normal. No accessory muscle usage or stridor. No respiratory distress. She has no decreased breath sounds. She has no wheezes. She has no rhonchi. She has no rales. She exhibits no tenderness.  Abdominal: Soft. She exhibits no distension.  Musculoskeletal: Normal range of motion. She exhibits no edema.       Right shoulder: Normal.       Left shoulder: Normal.       Right elbow: Normal.      Left elbow: Normal.       Right hip: Normal.       Left hip: Normal.       Right knee: Normal.       Left knee: Normal.       Cervical back: Normal.       Right forearm: She exhibits tenderness.       Left forearm: She exhibits tenderness.       Right hand: Normal.       Left hand: Normal.       Right upper leg: She exhibits tenderness.       Left upper leg: She exhibits tenderness.       Right lower leg: She exhibits tenderness.       Left lower leg: She exhibits tenderness.  Lymphadenopathy:       Head (right side): No submental, no submandibular, no tonsillar, no preauricular, no posterior auricular and no occipital adenopathy present.       Head (left side): No submental, no submandibular, no tonsillar, no preauricular, no posterior auricular and no occipital adenopathy present.    She has no cervical adenopathy.       Right cervical: No superficial cervical, no deep cervical and no posterior cervical adenopathy present.      Left cervical: No superficial cervical, no deep cervical and no posterior cervical adenopathy present.  Neurological: She is alert and oriented to person, place, and time. She has normal strength. She is not disoriented. She displays no atrophy and no tremor. No cranial nerve deficit or sensory deficit. She exhibits normal muscle tone. She displays no seizure activity. Coordination and gait normal. GCS eye subscore  is 4. GCS verbal subscore is 5. GCS motor subscore is 6.  On/off exam table in/out of chair without difficulty gait sure and steady in hall  Skin: Skin is warm, dry and intact. Rash noted. No abrasion, no bruising, no burn, no ecchymosis, no laceration, no lesion, no petechiae and no purpura noted. Rash is macular and vesicular. Rash is not papular, not maculopapular, not nodular, not pustular and not urticarial. She is not diaphoretic. No cyanosis or erythema. No pallor. Nails show no clubbing.     Grouped vesicles on erythematous macular bed anterior shins/thighs/forearms and posterior forearms; some blisters have popped and clear serous fluid discharge onto gauze pads/bandaids patient applied to arms and legs to prevent infection/collect fluid so does not stain clothes  Psychiatric: She has a normal mood and affect. Her speech is normal and behavior is normal. Judgment and thought content normal. Cognition and memory are normal.  Nursing note and vitals reviewed.         Assessment & Plan:  A-contact dermatitis due to plan and diabetes mellitus type II without complications  P-patient given tefla bandaids and tefla pads with  cobain from clinic stock to keep affected areas covered while at work until healed/scabbed.  Discussed allowing to open air when at home to avoid skin maceration.  Washing with soap and water daily, may need to wash towels/sheets/blankets more often due to serous discharge to prevent infection.  Stop hydrogen peroxide and rubbing alcohol treatments as can lengthen wound healing time.  Rx oral steroids due to large expanse body affected bilateral arms/legs.  Prednisone 10mg  tabs dispensed from [facility] start 60mg  po daily with food at breakfast x 3 days then 50mg x3d;40x3;30x3;20x3 follow up for re-evaluation in 2 weeks sooner if purulent discharge, fever, worsening pain, red streaks as discussed cellulitis possible. Discussed with patient can cause insomnia, increased  glucose levels/HgbA1c probably will be elevated this quarter labs.  Hydrate to keep urine pale/clear light yellow.  Widespread greater than 20% body surface area.  1-2mg /kg Prednisone (max 60mg ) for 7-10 days and taper over next 7-10 days per Up to Date.  Symptomatic therapy suggested e.g. Calamine lotion, benadryl gel topical BID prn affected areas or OTC zyrtec 10mg  po BID. (patient has all at home and has been using prn itching)  Warm to cool water soaks and/or oatmeal baths.  Call or return to clinic as needed if these symptoms worsen or fail to improve as anticipated especially lesions noted on eye, visual changes or visual loss.  Discussed avoidance/no contact wear of long sleeves/pants/socks/gloves and handkerchief around neck/mouth/face and use of poison ivy block cream along with tepid shower immediately after completion of yard work.  Keep poison ivy block lotion and soap at home as exposure likely to occur again.  Patient provided education to self reviewing plant identification on internet  Avoid scratching lesions to prevent secondary infections.  May apply ice to itchy areas if po/topical meds not yet active systemically or wearing off prior to next dose.  Exitcare handout on contact dermatitis poison ivy given to patient Patient  verbalized agreement and understanding of treatment plan and had no further questions at this time.   P2:  Avoidance and hand washing.  Marland Kitchen

## 2016-12-09 ENCOUNTER — Ambulatory Visit: Payer: Self-pay | Admitting: Registered Nurse

## 2016-12-09 VITALS — BP 147/82 | HR 82 | Temp 98.4°F

## 2016-12-09 DIAGNOSIS — E119 Type 2 diabetes mellitus without complications: Secondary | ICD-10-CM

## 2016-12-09 DIAGNOSIS — L247 Irritant contact dermatitis due to plants, except food: Secondary | ICD-10-CM

## 2016-12-09 MED ORDER — FREESTYLE LIBRE SENSOR SYSTEM MISC: 1.0000 [IU] | 2 refills | Status: AC

## 2016-12-09 MED ORDER — FREESTYLE LIBRE READER DEVI: 1.0000 [IU] | Freq: Every day | 0 refills | Status: AC

## 2016-12-09 NOTE — Progress Notes (Signed)
   Subjective:    Patient ID: Laurie Tran, female    DOB: 1945/12/21, 71 y.o.   MRN: [id number]  HPI    Review of Systems     Objective:   Physical Exam        Assessment & Plan:

## 2016-12-09 NOTE — Patient Instructions (Signed)
Blood Glucose Monitoring, Adult Monitoring your blood sugar (glucose) helps you manage your diabetes. It also helps you and your health care provider determine how well your diabetes management plan is working. Blood glucose monitoring involves checking your blood glucose as often as directed, and keeping a record (log) of your results over time. Why should I monitor my blood glucose? Checking your blood glucose regularly can:  Help you understand how food, exercise, illnesses, and medicines affect your blood glucose.  Let you know what your blood glucose is at any time. You can quickly tell if you are having low blood glucose (hypoglycemia) or high blood glucose (hyperglycemia).  Help you and your health care provider adjust your medicines as needed.  When should I check my blood glucose? Follow instructions from your health care provider about how often to check your blood glucose. This may depend on:  The type of diabetes you have.  How well-controlled your diabetes is.  Medicines you are taking.  If you have type 1 diabetes:  Check your blood glucose at least 2 times a day.  Also check your blood glucose: ? Before every insulin injection. ? Before and after exercise. ? Between meals. ? 2 hours after a meal. ? Occasionally between 2:00 a.m. and 3:00 a.m., as directed. ? Before potentially dangerous tasks, like driving or using heavy machinery. ? At bedtime.  You may need to check your blood glucose more often, up to 6-10 times a day: ? If you use an insulin pump. ? If you need multiple daily injections (MDI). ? If your diabetes is not well-controlled. ? If you are ill. ? If you have a history of severe hypoglycemia. ? If you have a history of not knowing when your blood glucose is getting low (hypoglycemia unawareness). If you have type 2 diabetes:  If you take insulin or other diabetes medicines, check your blood glucose at least 2 times a day.  If you are on intensive  insulin therapy, check your blood glucose at least 4 times a day. Occasionally, you may also need to check between 2:00 a.m. and 3:00 a.m., as directed.  Also check your blood glucose: ? Before and after exercise. ? Before potentially dangerous tasks, like driving or using heavy machinery.  You may need to check your blood glucose more often if: ? Your medicine is being adjusted. ? Your diabetes is not well-controlled. ? You are ill. What is a blood glucose log?  A blood glucose log is a record of your blood glucose readings. It helps you and your health care provider: ? Look for patterns in your blood glucose over time. ? Adjust your diabetes management plan as needed.  Every time you check your blood glucose, write down your result and notes about things that may be affecting your blood glucose, such as your diet and exercise for the day.  Most glucose meters store a record of glucose readings in the meter. Some meters allow you to download your records to a computer. How do I check my blood glucose? Follow these steps to get accurate readings of your blood glucose: Supplies needed   Blood glucose meter.  Test strips for your meter. Each meter has its own strips. You must use the strips that come with your meter.  A needle to prick your finger (lancet). Do not use lancets more than once.  A device that holds the lancet (lancing device).  A journal or log book to write down your results. Procedure  Wash your hands with soap and water.  Prick the side of your finger (not the tip) with the lancet. Use a different finger each time.  Gently rub the finger until a small drop of blood appears.  Follow instructions that come with your meter for inserting the test strip, applying blood to the strip, and using your blood glucose meter.  Write down your result and any notes. Alternative testing sites  Some meters allow you to use areas of your body other than your finger  (alternative sites) to test your blood.  If you think you may have hypoglycemia, or if you have hypoglycemia unawareness, do not use alternative sites. Use your finger instead.  Alternative sites may not be as accurate as the fingers, because blood flow is slower in these areas. This means that the result you get may be delayed, and it may be different from the result that you would get from your finger.  The most common alternative sites are: ? Forearm. ? Thigh. ? Palm of the hand. Additional tips  Always keep your supplies with you.  If you have questions or need help, all blood glucose meters have a 24-hour "hotline" number that you can call. You may also contact your health care provider.  After you use a few boxes of test strips, adjust (calibrate) your blood glucose meter by following instructions that came with your meter. This information is not intended to replace advice given to you by your health care provider. Make sure you discuss any questions you have with your health care provider. Document Released: 05/15/2003 Document Revised: 11/30/2015 Document Reviewed: 10/22/2015 Elsevier Interactive Patient Education  2017 [place] Dermatitis Poison ivy dermatitis is inflammation of the skin that is caused by the allergens on the leaves of the poison ivy plant. The skin reaction often involves redness, swelling, blisters, and extreme itching. What are the causes? This condition is caused by a specific chemical (urushiol) found in the sap of the poison ivy plant. This chemical is sticky and can be easily spread to people, animals, and objects. You can get poison ivy dermatitis by:  Having direct contact with a poison ivy plant.  Touching animals, other people, or objects that have come in contact with poison ivy and have the chemical on them.  What increases the risk? This condition is more likely to develop in:  People who are outdoors often.  People who go  outdoors without wearing protective clothing, such as closed shoes, long pants, and a long-sleeved shirt.  What are the signs or symptoms? Symptoms of this condition include:  Redness and itching.  A rash that often includes bumps and blisters. The rash usually appears 48 hours after exposure.  Swelling. This may occur if the reaction is more severe.  Symptoms usually last for 1-2 weeks. However, the first time you develop this condition, symptoms may last 3-4 weeks. How is this diagnosed? This condition may be diagnosed based on your symptoms and a physical exam. Your health care provider may also ask you about any recent outdoor activity. How is this treated? Treatment for this condition will vary depending on how severe it is. Treatment may include:  Hydrocortisone creams or calamine lotions to relieve itching.  Oatmeal baths to soothe the skin.  Over-the-counter antihistamine tablets.  Oral steroid medicine for more severe outbreaks.  Follow these instructions at home:  Take or apply over-the-counter and prescription medicines only as told by your health care provider.  Wash exposed  skin as soon as possible with soap and cold water.  Use hydrocortisone creams or calamine lotion as needed to soothe the skin and relieve itching.  Take oatmeal baths as needed. Use colloidal oatmeal. You can get this at your local pharmacy or grocery store. Follow the instructions on the packaging.  Do not scratch or rub your skin.  While you have the rash, wash clothes right after you wear them. How is this prevented?  Learn to identify the poison ivy plant and avoid contact with the plant. This plant can be recognized by the number of leaves. Generally, poison ivy has three leaves with flowering branches on a single stem. The leaves are typically glossy, and they have jagged edges that come to a point at the front.  If you have been exposed to poison ivy, thoroughly wash with soap and  water right away. You have about 30 minutes to remove the plant resin before it will cause the rash. Be sure to wash under your fingernails because any plant resin there will continue to spread the rash.  When hiking or camping, wear clothes that will help you to avoid exposure on the skin. This includes long pants, a long-sleeved shirt, tall socks, and hiking boots. You can also apply preventive lotion to your skin to help limit exposure.  If you suspect that your clothes or outdoor gear came in contact with poison ivy, rinse them off outside with a garden hose before you bring them inside your house. Contact a health care provider if:  You have open sores in the rash area.  You have more redness, swelling, or pain in the affected area.  You have redness that spreads beyond the rash area.  You have fluid, blood, or pus coming from the affected area.  You have a fever.  You have a rash over a large area of your body.  You have a rash on your eyes, mouth, or genitals.  Your rash does not improve after a few days. Get help right away if:  Your face swells or your eyes swell shut.  You have trouble breathing.  You have trouble swallowing. This information is not intended to replace advice given to you by your health care provider. Make sure you discuss any questions you have with your health care provider. Document Released: 05/09/2000 Document Revised: 10/18/2015 Document Reviewed: 10/18/2014 Elsevier Interactive Patient Education  2018 [place]. Cellulitis, Adult Cellulitis is a skin infection. The infected area is usually red and tender. This condition occurs most often in the arms and lower legs. The infection can travel to the muscles, blood, and underlying tissue and become serious. It is very important to get treated for this condition. What are the causes? Cellulitis is caused by bacteria. The bacteria enter through a break in the skin, such as a cut, burn, insect bite,  open sore, or crack. What increases the risk? This condition is more likely to occur in people who:  Have a weak defense system (immune system).  Have open wounds on the skin such as cuts, burns, bites, and scrapes. Bacteria can enter the body through these open wounds.  Are older.  Have diabetes.  Have a type of long-lasting (chronic) liver disease (cirrhosis) or kidney disease.  Use IV drugs.  What are the signs or symptoms? Symptoms of this condition include:  Redness, streaking, or spotting on the skin.  Swollen area of the skin.  Tenderness or pain when an area of the skin is touched.  Warm skin.  Fever.  Chills.  Blisters.  How is this diagnosed? This condition is diagnosed based on a medical history and physical exam. You may also have tests, including:  Blood tests.  Lab tests.  Imaging tests.  How is this treated? Treatment for this condition may include:  Medicines, such as antibiotic medicines or antihistamines.  Supportive care, such as rest and application of cold or warm cloths (cold or warm compresses) to the skin.  Hospital care, if the condition is severe.  The infection usually gets better within 1-2 days of treatment. Follow these instructions at home:  Take over-the-counter and prescription medicines only as told by your health care provider.  If you were prescribed an antibiotic medicine, take it as told by your health care provider. Do not stop taking the antibiotic even if you start to feel better.  Drink enough fluid to keep your urine clear or pale yellow.  Do not touch or rub the infected area.  Raise (elevate) the infected area above the level of your heart while you are sitting or lying down.  Apply warm or cold compresses to the affected area as told by your health care provider.  Keep all follow-up visits as told by your health care provider. This is important. These visits let your health care provider make sure a more  serious infection is not developing. Contact a health care provider if:  You have a fever.  Your symptoms do not improve within 1-2 days of starting treatment.  Your bone or joint underneath the infected area becomes painful after the skin has healed.  Your infection returns in the same area or another area.  You notice a swollen bump in the infected area.  You develop new symptoms.  You have a general ill feeling (malaise) with muscle aches and pains. Get help right away if:  Your symptoms get worse.  You feel very sleepy.  You develop vomiting or diarrhea that persists.  You notice red streaks coming from the infected area.  Your red area gets larger or turns dark in color. This information is not intended to replace advice given to you by your health care provider. Make sure you discuss any questions you have with your health care provider. Document Released: 02/19/2005 Document Revised: 09/20/2015 Document Reviewed: 03/21/2015 Elsevier Interactive Patient Education  2017 [employer].

## 2016-12-09 NOTE — Progress Notes (Signed)
70y/o caucasian female established patient reports exposure to poison oak/ivy 13 days ago started prednisone 7 days ago currently on 30mg  per day first day needs refill to finish 3 week taper/and here for re-evaluation.  Having some insomnia and feels like her blood sugars are up.  Refused CBP today in clinic.  No longer covering lesions 99% dried up and size decreasing for area affected.  Has been using cortisone, calamine and benadryl still prn.  affected Sites to bilateral forearms and legs/thighs/calves.  Drainage clear yellow/weeping from blisters.     Review of Systems  Constitutional: Negative for activity change, appetite change, chills, diaphoresis, fatigue and fever.  HENT: Negative for congestion, ear pain and sore throat.   Eyes: Negative for pain and discharge.  Respiratory: Negative for cough and wheezing.   Cardiovascular: Negative for chest pain and leg swelling.  Gastrointestinal: Negative for blood in stool, constipation, diarrhea, nausea and vomiting.  Endocrine: Negative for cold intolerance and heat intolerance.  Genitourinary: Negative for difficulty urinating, dysuria and hematuria.  Musculoskeletal: Negative for arthralgias, back pain and myalgias.  Skin: Positive for color change, rash and wound. Negative for pallor.  Allergic/Immunologic: Positive for environmental allergies. Negative for food allergies.  Neurological: Negative for dizziness, tremors, weakness, numbness and headaches.  Hematological: Negative for adenopathy. Does not bruise/bleed easily.  Psychiatric/Behavioral: Negative for agitation, confusion and sleep disturbance. The patient is not nervous/anxious.        Objective:   Physical Exam  Constitutional: She is oriented to person, place, and time. Vital signs are normal. She appears well-developed and well-nourished. She is active and cooperative.  Non-toxic appearance. She does not have a sickly appearance. She does not appear ill. No  distress.  HENT:  Head: Normocephalic and atraumatic.  Right Ear: Hearing, external ear and ear canal normal. A middle ear effusion is present.  Left Ear: Hearing, external ear and ear canal normal. A middle ear effusion is present.  Nose: Nose normal. No mucosal edema, rhinorrhea, nose lacerations, nasal deformity, septal deviation or nasal septal hematoma. No epistaxis.  No foreign bodies.  Mouth/Throat: Uvula is midline and mucous membranes are normal. Mucous membranes are not pale, not dry and not cyanotic. She does not have dentures. No oral lesions. No trismus in the jaw. Normal dentition. No dental abscesses, uvula swelling, lacerations or dental caries. No oropharyngeal exudate, posterior oropharyngeal edema, posterior oropharyngeal erythema or tonsillar abscesses.  Eyes: Conjunctivae, EOM and lids are normal. Pupils are equal, round, and reactive to light. Right eye exhibits no chemosis, no discharge, no exudate and no hordeolum. No foreign body present in the right eye. Left eye exhibits no chemosis, no discharge, no exudate and no hordeolum. No foreign body present in the left eye. Right conjunctiva is not injected. Right conjunctiva has no hemorrhage. Left conjunctiva is not injected. Left conjunctiva has no hemorrhage. No scleral icterus. Right eye exhibits normal extraocular motion and no nystagmus. Left eye exhibits normal extraocular motion and no nystagmus. Right pupil is round and reactive. Left pupil is round and reactive. Pupils are equal.  Neck: Trachea normal, normal range of motion and phonation normal. Neck supple. No tracheal tenderness, no spinous process tenderness and no muscular tenderness present. No neck rigidity. No tracheal deviation, no edema, no erythema and normal range of motion present. No thyroid mass and no thyromegaly present.  Cardiovascular: Normal rate, regular rhythm, S1 normal, S2 normal, normal heart sounds and intact distal pulses.  PMI is not displaced.  Exam  reveals no  gallop and no friction rub.   No murmur heard. Pulses:      Radial pulses are 2+ on the right side, and 2+ on the left side.  Pulmonary/Chest: Effort normal and breath sounds normal. No accessory muscle usage or stridor. No respiratory distress. She has no decreased breath sounds. She has no wheezes. She has no rhonchi. She has no rales. She exhibits no tenderness.  Abdominal: Soft. She exhibits no distension.  Musculoskeletal: Normal range of motion. She exhibits no edema.       Right shoulder: Normal.       Left shoulder: Normal.       Right elbow: Normal.      Left elbow: Normal.       Right hip: Normal.       Left hip: Normal.       Right knee: Normal.       Left knee: Normal.       Cervical back: Normal.       Right forearm: She exhibits tenderness.       Left forearm: She exhibits tenderness.       Right hand: Normal.       Left hand: Normal.       Right upper leg: She exhibits tenderness.       Left upper leg: She exhibits tenderness.       Right lower leg: She exhibits tenderness.       Left lower leg: She exhibits tenderness.  Lymphadenopathy:       Head (right side): No submental, no submandibular, no tonsillar, no preauricular, no posterior auricular and no occipital adenopathy present.       Head (left side): No submental, no submandibular, no tonsillar, no preauricular, no posterior auricular and no occipital adenopathy present.    She has no cervical adenopathy.       Right cervical: No superficial cervical, no deep cervical and no posterior cervical adenopathy present.      Left cervical: No superficial cervical, no deep cervical and no posterior cervical adenopathy present.  Neurological: She is alert and oriented to person, place, and time. She has normal strength. She is not disoriented. She displays no atrophy and no tremor. No cranial nerve deficit or sensory deficit. She exhibits normal muscle tone. She displays no seizure activity. Coordination and gait  normal. GCS eye subscore is 4. GCS verbal subscore is 5. GCS motor subscore is 6.  On/off exam table in/out of chair without difficulty gait sure and steady in hall  Skin: Skin is warm, dry and intact. Rash noted. No abrasion, no bruising, no burn, no ecchymosis, no laceration, no lesion, no petechiae and no purpura noted. Rash is macular and vesicular. Rash is not papular, not maculopapular, not nodular, not pustular and not urticarial. She is not diaphoretic. No cyanosis or erythema. No pallor. Nails show no clubbing.     Grouped flat/deflated vesicles on slightly erythematous macular bed anterior shins/thighs/forearms and posterior forearms; all blisters have popped and scant clear serous fluid discharge if any noted on skin today  Patient no longer wearing bandaids/telfa  Psychiatric: She has a normal mood and affect. Her speech is normal and behavior is normal. Judgment and thought content normal. Cognition and memory are normal.  Nursing note and vitals reviewed.         Assessment & Plan:  A-contact dermatitis due to plan and diabetes mellitus type II without complications  P-patient given tefla bandaids and OTC triple antibiotic UD packets x  8 from clinic stock to keep affected areas covered while at work until healed/scabbed.  Discussed allowing to open air when at home to avoid skin maceration.  Washing with soap and water daily, may need to wash towels/sheets/blankets more often due to serous discharge to prevent infection.  continue Rx oral steroids due to large expanse body affected bilateral arms/legs.  Prednisone 10mg  tabs dispensed from [facility] continue 30mg  po daily x 3 days with breakfast then 20mg x3days/10mg x3days follow up for re-evaluation in 1 week sooner if purulent discharge, fever, worsening pain, red streaks as discussed cellulitis possible. Discussed with patient can cause insomnia, increased glucose levels/HgbA1c probably will be elevated this quarter labs.   Hydrate to keep urine pale/clear light yellow.  Widespread greater than 20% body surface area.  1-2mg /kg Prednisone (max 60mg ) for 7-10 days and taper over next 7-10 days per Up to Date.  Symptomatic therapy suggested e.g. Calamine lotion, benadryl gel topical BID prn affected areas or OTC zyrtec 10mg  po BID. (patient has all at home and has been using prn itching)  Warm to cool water soaks and/or oatmeal baths.  Call or return to clinic as needed if these symptoms worsen or fail to improve as anticipated especially lesions noted on eye, visual changes or visual loss.  Discussed avoidance/no contact wear of long sleeves/pants/socks/gloves and handkerchief around neck/mouth/face and use of poison ivy block cream along with tepid shower immediately after completion of yard work.  Keep poison ivy block lotion and soap at home as exposure likely to occur again.  Patient provided education to self reviewing plant identification on internet  Avoid scratching lesions to prevent secondary infections.  May apply ice to itchy areas if po/topical meds not yet active systemically or wearing off prior to next dose.  Exitcare handout on contact dermatitis poison ivy given to patient Patient  verbalized agreement and understanding of treatment plan and had no further questions at this time.   P2:  Avoidance and hand washing.  Patient well controlled HgbA1c 5.9 2017 labs but concerned elevated due to steroid treatment.  Patient to repeat fasting labs next quarter f/u with PCM. New Rx for home blood glucose monitoring patient would prefer abbott freestyle libre her current machine battery will not function and she does not like using lancets.  Discussed with patient she could have fingersticks performed at work also with [employer]. Continue home monitoring of blood sugars and bring log to next appt.  Encouraged to continue exercise routine and wear supportive shoes.  Moisturize skin to keep healthy and prevent sores/breakdown.   Annual routine diabetic eye exam when due.  Patient verbalized understanding of information/instructions/ agreed with plan of care and had no further questions at this time  .

## 2016-12-26 ENCOUNTER — Ambulatory Visit: Payer: Self-pay | Admitting: *Deleted

## 2016-12-26 VITALS — BP 152/90 | Ht 65.0 in | Wt 127.0 lb

## 2016-12-26 DIAGNOSIS — Z0000 Encounter for general adult medical examination without abnormal findings: Secondary | ICD-10-CM

## 2016-12-26 NOTE — Progress Notes (Signed)
[employer] insurance premium discount evaluation: Labs Drawn. Replacements ROI form signed. Tobacco Free Attestation form signed.  Forms placed in paper chart.  Results okay to route to pcp per pt.

## 2016-12-27 LAB — CMP12+LP+TP+TSH+6AC+CBC/D/PLT
ALBUMIN: 4.6 g/dL (ref 3.6–4.8)
ALT: 22 IU/L (ref 0–32)
AST: 24 IU/L (ref 0–40)
Albumin/Globulin Ratio: 2.1 (ref 1.2–2.2)
Alkaline Phosphatase: 106 IU/L (ref 39–117)
BUN/Creatinine Ratio: 20 (ref 12–28)
BUN: 16 mg/dL (ref 8–27)
Basophils Absolute: 0 10*3/uL (ref 0.0–0.2)
Basos: 0 %
Bilirubin Total: 0.4 mg/dL (ref 0.0–1.2)
CALCIUM: 9.3 mg/dL (ref 8.7–10.3)
CHLORIDE: 102 mmol/L (ref 96–106)
CHOLESTEROL TOTAL: 173 mg/dL (ref 100–199)
Chol/HDL Ratio: 2.6 ratio (ref 0.0–4.4)
Creatinine, Ser: 0.79 mg/dL (ref 0.57–1.00)
EOS (ABSOLUTE): 0.2 10*3/uL (ref 0.0–0.4)
Eos: 2 %
FREE THYROXINE INDEX: 2 (ref 1.2–4.9)
GFR calc non Af Amer: 77 mL/min/{1.73_m2} (ref >59)
GFR, EST AFRICAN AMERICAN: 89 mL/min/{1.73_m2} (ref >59)
GGT: 7 IU/L (ref 0–60)
GLOBULIN, TOTAL: 2.2 g/dL (ref 1.5–4.5)
Glucose: 94 mg/dL (ref 65–99)
HDL: 66 mg/dL (ref >39)
Hematocrit: 40.1 % (ref 34.0–46.6)
Hemoglobin: 13 g/dL (ref 11.1–15.9)
IMMATURE GRANS (ABS): 0 10*3/uL (ref 0.0–0.1)
IRON: 84 ug/dL (ref 27–139)
Immature Granulocytes: 0 %
LDH: 208 IU/L (ref 119–226)
LDL Calculated: 94 mg/dL (ref 0–99)
LYMPHS: 35 %
Lymphocytes Absolute: 2.5 10*3/uL (ref 0.7–3.1)
MCH: 27.8 pg (ref 26.6–33.0)
MCHC: 32.4 g/dL (ref 31.5–35.7)
MCV: 86 fL (ref 79–97)
MONOCYTES: 3 %
MONOS ABS: 0.2 10*3/uL (ref 0.1–0.9)
Neutrophils Absolute: 4.2 10*3/uL (ref 1.4–7.0)
Neutrophils: 60 %
Phosphorus: 2.8 mg/dL (ref 2.5–4.5)
Platelets: 235 10*3/uL (ref 150–379)
Potassium: 4.2 mmol/L (ref 3.5–5.2)
RBC: 4.67 x10E6/uL (ref 3.77–5.28)
RDW: 14.1 % (ref 12.3–15.4)
Sodium: 145 mmol/L — ABNORMAL HIGH (ref 134–144)
T3 UPTAKE RATIO: 23 % — AB (ref 24–39)
T4, Total: 8.7 ug/dL (ref 4.5–12.0)
TOTAL PROTEIN: 6.8 g/dL (ref 6.0–8.5)
TSH: 3.2 u[IU]/mL (ref 0.450–4.500)
Triglycerides: 67 mg/dL (ref 0–149)
Uric Acid: 4.6 mg/dL (ref 2.5–7.1)
VLDL Cholesterol Cal: 13 mg/dL (ref 5–40)
WBC: 7.1 10*3/uL (ref 3.4–10.8)

## 2016-12-27 LAB — HGB A1C W/O EAG: HEMOGLOBIN A1C: 6 % — AB (ref 4.8–5.6)

## 2016-12-30 NOTE — Progress Notes (Signed)
Results reviewed with pt 12/29/16. A1c elevated but similar to previous. NA+ slightly elevated. Push fluids. All other labs unremarkable. BP high. F/u with pcp.  Results routed to PCP per pt request.

## 2017-01-17 ENCOUNTER — Encounter: Payer: Self-pay | Admitting: Family Medicine

## 2017-01-19 ENCOUNTER — Ambulatory Visit (INDEPENDENT_AMBULATORY_CARE_PROVIDER_SITE_OTHER): Payer: No Typology Code available for payment source | Admitting: Family Medicine

## 2017-01-19 ENCOUNTER — Encounter: Payer: Self-pay | Admitting: Family Medicine

## 2017-01-19 VITALS — BP 130/70 | HR 80 | Temp 98.7°F | Ht 63.75 in | Wt 124.2 lb

## 2017-01-19 DIAGNOSIS — E7800 Pure hypercholesterolemia, unspecified: Secondary | ICD-10-CM

## 2017-01-19 DIAGNOSIS — Z23 Encounter for immunization: Secondary | ICD-10-CM

## 2017-01-19 DIAGNOSIS — Z1159 Encounter for screening for other viral diseases: Secondary | ICD-10-CM

## 2017-01-19 DIAGNOSIS — E119 Type 2 diabetes mellitus without complications: Secondary | ICD-10-CM | POA: Diagnosis not present

## 2017-01-19 DIAGNOSIS — Z0000 Encounter for general adult medical examination without abnormal findings: Secondary | ICD-10-CM | POA: Diagnosis not present

## 2017-01-19 DIAGNOSIS — E2839 Other primary ovarian failure: Secondary | ICD-10-CM

## 2017-01-19 DIAGNOSIS — I10 Essential (primary) hypertension: Secondary | ICD-10-CM

## 2017-01-19 DIAGNOSIS — L900 Lichen sclerosus et atrophicus: Secondary | ICD-10-CM | POA: Diagnosis not present

## 2017-01-19 DIAGNOSIS — D126 Benign neoplasm of colon, unspecified: Secondary | ICD-10-CM | POA: Diagnosis not present

## 2017-01-19 MED ORDER — MOMETASONE FUROATE 50 MCG/ACT NA SUSP: 2.0000 | Freq: Every day | NASAL | 11 refills | Status: DC | PRN

## 2017-01-19 MED ORDER — FEXOFENADINE HCL 180 MG PO TABS: ORAL_TABLET | ORAL | 3 refills | Status: DC

## 2017-01-19 MED ORDER — ATORVASTATIN CALCIUM 10 MG PO TABS: 10.0000 mg | ORAL_TABLET | Freq: Every day | ORAL | 3 refills | Status: DC

## 2017-01-19 MED ORDER — METFORMIN HCL 500 MG PO TABS: ORAL_TABLET | ORAL | 3 refills | Status: DC

## 2017-01-19 MED ORDER — HYDROCHLOROTHIAZIDE 12.5 MG PO CAPS: ORAL_CAPSULE | ORAL | 3 refills | Status: DC

## 2017-01-19 NOTE — Patient Instructions (Addendum)
Don't forget to get your eye exam  Hep C screen today   Pneumovax 23 today   You are due for your 5 year colonoscopy at [facility] , no not forget to schedule it   We will refer you for a bone density test   We will refer you to a new gyn for lichen sclerosis   Follow up in 6 months

## 2017-01-19 NOTE — Assessment & Plan Note (Signed)
Lab Results  Component Value Date   HGBA1C 6.0 (H) 12/26/2016   Pt was also on prednisone this summer for poison ivy  She cannot have ace or arb due to hx of anaphylaxis with another drug  Will plan on microalb next time  She will schedule her own eye exam  Continue metformin and low glycemic diet

## 2017-01-19 NOTE — Assessment & Plan Note (Signed)
Reviewed health habits including diet and exercise and skin cancer prevention Reviewed appropriate screening tests for age  Also reviewed health mt list, fam hx and immunization status , as well as social and family history   See HPI  Labs reviewedDon't forget to get your eye exam  Hep C screen today  Pneumovax 23 today  You are due for your 5 year colonoscopy at [facility] , no not forget to schedule it  We will refer you for a bone density test

## 2017-01-19 NOTE — Assessment & Plan Note (Signed)
Due for 5 y colonoscopy  Pt aware and rec letter She will schedule this herself

## 2017-01-19 NOTE — Assessment & Plan Note (Signed)
Disc goals for lipids and reasons to control them Rev labs with pt Rev low sat fat diet in detail  Well controlled with statin and diet

## 2017-01-19 NOTE — Assessment & Plan Note (Signed)
Screening test today

## 2017-01-19 NOTE — Assessment & Plan Note (Signed)
Due for f/u -she wishes to see a new gyn  Improvement with temovate cream prn  Will ref to gyn

## 2017-01-19 NOTE — Progress Notes (Signed)
Subjective:    Patient ID: Laurie Tran, female    DOB: [birth year], 71 y.o.   MRN: [id number]  HPI Here for health maintenance exam and to review chronic medical problems    Had a kidney stone  Drinking lots of water  Also poison ivy -was on prednisone for an extended period of time  Not on medicare   Wt Readings from Last 3 Encounters:  01/19/17 124 lb 4 oz (56.4 kg)  12/26/16 127 lb (57.6 kg)  10/21/16 124 lb (56.2 kg)   21.50 kg/m  Hep C screening - she is interested in screening today  Eye exam -she is due for it /will make her own appt   Mammogram (just had it on sat at [facility]) nl    Sister and mother had breast cancer  Self breast exam -no lumps  Pending report here   Flu shot -will get in the fall   PNA vaccine - due for PPSV23  Tetanus shot 1/13 Tdap  zostavax 9/13   Colonoscopy 2/13 with 5 y recall  Aware- sent her a reminder  She will make her own appt   dexa 2/10-= heel nl  Is interested in that  Wants to get at [facility] (intol of ca)  Exercises regularly  No fractures She had one fall in [place]- fell on sidewalk w/o injury   She is seen for lichen sclerosis  Had a painful biopsy in the past  She would like to go to a different doctor at [facility]    bp is stable today  No cp or palpitations or headaches or edema  No side effects to medicines  BP Readings from Last 3 Encounters:  01/19/17 130/70  12/26/16 (!) 152/90  12/09/16 (!) 147/82     Lab Results  Component Value Date   CREATININE 0.79 12/26/2016   BUN 16 12/26/2016   NA 145 (H) 12/26/2016   K 4.2 12/26/2016   CL 102 12/26/2016   CO2 27 09/30/2016    Lab Results  Component Value Date   ALT 22 12/26/2016   AST 24 12/26/2016   GGT 7 12/26/2016   ALKPHOS 106 12/26/2016   BILITOT 0.4 12/26/2016     Lab Results  Component Value Date   WBC 7.1 12/26/2016   HGB 13.0 12/26/2016   HCT 40.1 12/26/2016   MCV 86 12/26/2016   PLT 235 12/26/2016    Lab Results    Component Value Date   TSH 3.200 12/26/2016     DM2 Lab Results  Component Value Date   HGBA1C 6.0 (H) 12/26/2016   Metformin and diet  She was on prednisone for poison ivy - and that affected it (21 days of prednisone)    Hyperlipidemia- Lab Results  Component Value Date   CHOL 173 12/26/2016   HDL 66 12/26/2016   LDLCALC 94 12/26/2016   TRIG 67 12/26/2016   CHOLHDL 2.6 12/26/2016  statin and diet    Patient Active Problem List   Diagnosis Date Noted  . Encounter for hepatitis C screening test for low risk patient 01/19/2017  . Estrogen deficiency 01/19/2017  . Lichen sclerosus 22/06/5425  . Vaginal itching 02/08/2016  . Right foot pain 02/08/2016  . Shingles 11/13/2011  . Routine general medical examination at a health care facility 05/30/2011  . Other screening mammogram 05/30/2011  . Colon polyps 05/30/2011  . DM type 2 (diabetes mellitus, type 2) ([place]) 10/29/2010  . GERD (gastroesophageal reflux disease) 10/29/2010  .  HTN (hypertension) 10/29/2010  . Hyperlipidemia 10/29/2010  . History of kidney stones 10/29/2010  . Perennial allergic rhinitis 10/29/2010   Past Medical History:  Diagnosis Date  . Allergy   . Diabetes mellitus 2007   type II  . Dysrhythmia 1998   "Fast heart rate. Used medication to stop it and restart. "  . GERD (gastroesophageal reflux disease)   . History of chicken pox   . History of kidney stones   . History of UTI   . Hypertension    Past Surgical History:  Procedure Laterality Date  . BREAST SURGERY  1085   breast biopsy benign  . CYSTOSCOPY WITH RETROGRADE PYELOGRAM, URETEROSCOPY AND STENT PLACEMENT Left 10/21/2016   Procedure: CYSTOSCOPY WITH RETROGRADE PYELOGRAM, LEFT URETEROSCOPY PYELOSCOPY  AND LEFT STENT PLACEMENT;  Surgeon: Carolan Clines, MD;  Location: [facility];  Service: Urology;  Laterality: Left;  . j basket retrieval     for kidney stones  . lipotripsy    . MENISCUS REPAIR  2006   rt knee   Social History   Substance Use Topics  . Smoking status: Never Smoker  . Smokeless tobacco: Never Used  . Alcohol use No   Family History  Problem Relation Age of Onset  . Cancer Mother        breast CA  . Arthritis Paternal Grandmother   . Stroke Paternal Grandmother   . Diabetes Paternal Grandmother   . Cancer Sister   . Breast cancer Sister    Allergies  Allergen Reactions  . Nsaids Anaphylaxis    Breathing problems   . Celebrex [Celecoxib] Hives    And congestion  . Vioxx [Rofecoxib] Hives    And congestion  . Ibuprofen Other (See Comments)    Reactive airways    Current Outpatient Prescriptions on File Prior to Visit  Medication Sig Dispense Refill  . clobetasol ointment (TEMOVATE) 0.05 % Apply at bedtime to the affected area 30 g 1  . Continuous Blood Gluc Receiver (FREESTYLE LIBRE READER) DEVI 1 Units by Does not apply route daily. 1 Device 0  . Continuous Blood Gluc Sensor (FREESTYLE LIBRE SENSOR SYSTEM) MISC 1 Units by Does not apply route as directed. 3 each 2   No current facility-administered medications on file prior to visit.      Review of Systems Review of Systems  Constitutional: Negative for fever, appetite change, fatigue and unexpected weight change.  Eyes: Negative for pain and visual disturbance.  Respiratory: Negative for cough and shortness of breath.   Cardiovascular: Negative for cp or palpitations    Gastrointestinal: Negative for nausea, diarrhea and constipation.  Genitourinary: Negative for urgency and frequency.  Skin: Negative for pallor or rash   Neurological: Negative for weakness, light-headedness, numbness and headaches.  Hematological: Negative for adenopathy. Does not bruise/bleed easily.  Psychiatric/Behavioral: Negative for dysphoric mood. The patient is not nervous/anxious.         Objective:   Physical Exam  Constitutional: She appears well-developed and well-nourished. No distress.  Well appearing   HENT:  Head: Normocephalic and  atraumatic.  Right Ear: External ear normal.  Left Ear: External ear normal.  Mouth/Throat: Oropharynx is clear and moist.  Eyes: Pupils are equal, round, and reactive to light. Conjunctivae and EOM are normal. No scleral icterus.  Neck: Normal range of motion. Neck supple. No JVD present. Carotid bruit is not present. No thyromegaly present.  Cardiovascular: Normal rate, regular rhythm, normal heart sounds and intact distal pulses.  Exam reveals no  gallop.   Pulmonary/Chest: Effort normal and breath sounds normal. No respiratory distress. She has no wheezes. She exhibits no tenderness.  Abdominal: Soft. Bowel sounds are normal. She exhibits no distension, no abdominal bruit and no mass. There is no tenderness.  Genitourinary: No breast swelling, tenderness, discharge or bleeding.  Genitourinary Comments: Breast exam: No mass, nodules, thickening, tenderness, bulging, retraction, inflamation, nipple discharge or skin changes noted.  No axillary or clavicular LA.      Dense breast tissue   Musculoskeletal: Normal range of motion. She exhibits no edema or tenderness.  Lymphadenopathy:    She has no cervical adenopathy.  Neurological: She is alert. She has normal reflexes. No cranial nerve deficit. She exhibits normal muscle tone. Coordination normal.  Skin: Skin is warm and dry. No rash noted. No erythema. No pallor.  Solar lentigines diffusely   Psychiatric: She has a normal mood and affect.  Pleasant and talkative           Assessment & Plan:   Problem List Items Addressed This Visit      Cardiovascular and Mediastinum   HTN (hypertension)    bp in fair control at this time  BP Readings from Last 1 Encounters:  01/19/17 130/70   No changes needed Disc lifstyle change with low sodium diet and exercise  Labs reviewed  Continue low dose hctz       Relevant Medications   hydrochlorothiazide (MICROZIDE) 12.5 MG capsule   atorvastatin (LIPITOR) 10 MG tablet     Digestive    Colon polyps    Due for 5 y colonoscopy  Pt aware and rec letter She will schedule this herself         Endocrine   DM type 2 (diabetes mellitus, type 2) ([place])    Lab Results  Component Value Date   HGBA1C 6.0 (H) 12/26/2016   Pt was also on prednisone this summer for poison ivy  She cannot have ace or arb due to hx of anaphylaxis with another drug  Will plan on microalb next time  She will schedule her own eye exam  Continue metformin and low glycemic diet       Relevant Medications   metFORMIN (GLUCOPHAGE) 500 MG tablet   atorvastatin (LIPITOR) 10 MG tablet     Musculoskeletal and Integument   Lichen sclerosus    Due for f/u -she wishes to see a new gyn  Improvement with temovate cream prn  Will ref to gyn      Relevant Orders   Ambulatory referral to Gynecology     Other   Encounter for hepatitis C screening test for low risk patient    Screening test today      Relevant Orders   Hepatitis C antibody   Estrogen deficiency    Ref for screening dexa       Relevant Orders   DG Bone Density   Hyperlipidemia    Disc goals for lipids and reasons to control them Rev labs with pt Rev low sat fat diet in detail  Well controlled with statin and diet       Relevant Medications   hydrochlorothiazide (MICROZIDE) 12.5 MG capsule   atorvastatin (LIPITOR) 10 MG tablet   Routine general medical examination at a health care facility - Primary    Reviewed health habits including diet and exercise and skin cancer prevention Reviewed appropriate screening tests for age  Also reviewed health mt list, fam hx and immunization status , as well as  social and family history   See HPI  Labs reviewedDon't forget to get your eye exam  Hep C screen today  Pneumovax 23 today  You are due for your 5 year colonoscopy at [facility] , no not forget to schedule it  We will refer you for a bone density test          Other Visit Diagnoses    Need for 23-polyvalent pneumococcal  polysaccharide vaccine       Relevant Orders   Pneumococcal polysaccharide vaccine 23-valent greater than or equal to 2yo subcutaneous/IM (Completed)

## 2017-01-19 NOTE — Assessment & Plan Note (Signed)
bp in fair control at this time  BP Readings from Last 1 Encounters:  01/19/17 130/70   No changes needed Disc lifstyle change with low sodium diet and exercise  Labs reviewed  Continue low dose hctz

## 2017-01-19 NOTE — Assessment & Plan Note (Signed)
Ref for screening dexa  

## 2017-01-20 LAB — HEPATITIS C ANTIBODY: HCV AB: NONREACTIVE

## 2017-02-06 ENCOUNTER — Encounter: Payer: Self-pay | Admitting: Family Medicine

## 2017-02-06 ENCOUNTER — Ambulatory Visit (INDEPENDENT_AMBULATORY_CARE_PROVIDER_SITE_OTHER): Payer: No Typology Code available for payment source | Admitting: Family Medicine

## 2017-02-06 VITALS — BP 136/77 | HR 91 | Ht 64.5 in | Wt 126.0 lb

## 2017-02-06 DIAGNOSIS — Z23 Encounter for immunization: Secondary | ICD-10-CM

## 2017-02-06 DIAGNOSIS — L900 Lichen sclerosus et atrophicus: Secondary | ICD-10-CM

## 2017-02-06 MED ORDER — CLOBETASOL PROPIONATE 0.05 % EX OINT: TOPICAL_OINTMENT | CUTANEOUS | 5 refills | Status: DC

## 2017-02-06 NOTE — Patient Instructions (Signed)
Lichen Sclerosus  Lichen sclerosus is a skin problem. It can happen on any part of the body, but it commonly involves the anal or genital areas. It can cause itching and discomfort in these areas. Treatment can help to control symptoms. When the genital area is affected, getting treatment is important because the condition can cause scarring that may lead to other problems.  What are the causes?  The cause of this condition is not known. It could be the result of an overactive immune system or a lack of certain hormones. Lichen sclerosus is not an infection or a fungus. It is not passed from one person to another (not contagious).  What increases the risk?  This condition is more likely to develop in women, usually after menopause.  What are the signs or symptoms?  Symptoms of this condition include:  · Thin, wrinkled, white areas on the skin.  · Thickened white areas on the skin.  · Red and swollen patches (lesions) on the skin.  · Tears or cracks in the skin.  · Bruising.  · Blood blisters.  · Severe itching.    You may also have pain, itching, or burning with urination. Constipation is also common in people with lichen sclerosus.  How is this diagnosed?  This condition may be diagnosed with a physical exam. In some cases, a tissue sample (biopsy sample) may be removed to be looked at under a microscope.  How is this treated?  This condition is usually treated with medicated creams or ointments (topical steroids) that are applied over the affected areas.  Follow these instructions at home:  · Take over-the-counter and prescription medicines only as told by your health care provider.  · Use creams or ointments as told by your health care provider.  · Do not scratch the affected areas of skin.  · Women should keep the vaginal area as clean and dry as possible.  · Keep all follow-up visits as told by your health care provider. This is important.  Contact a health care provider if:  · You have increasing redness,  swelling, or pain in the affected area.  · You have fluid, blood, or pus coming from the affected area.  · You have new lesions on your skin.  · You have a fever.  · You have pain during sex.  This information is not intended to replace advice given to you by your health care provider. Make sure you discuss any questions you have with your health care provider.  Document Released: 10/02/2010 Document Revised: 10/18/2015 Document Reviewed: 08/07/2014  Elsevier Interactive Patient Education © 2018 [employer]

## 2017-02-06 NOTE — Progress Notes (Signed)
   Subjective:    Patient ID: Laurie Tran is a 71 y.o. female presenting with Follow-up  on 02/06/2017  HPI: Having severe itching and has lichen sclerosis by biopsy. Using clobetasol prn and sparingly. Feels much improved symtoms.  Review of Systems  Constitutional: Negative for chills and fever.  Respiratory: Negative for shortness of breath.   Cardiovascular: Negative for chest pain.  Gastrointestinal: Negative for abdominal pain, nausea and vomiting.  Genitourinary: Negative for dysuria.  Skin: Negative for rash.      Objective:    BP 136/77   Pulse 91   Ht 5' 4.5" (1.638 m)   Wt 126 lb (57.2 kg)   BMI 21.29 kg/m  Physical Exam  Constitutional: She is oriented to person, place, and time. She appears well-developed and well-nourished. No distress.  HENT:  Head: Normocephalic and atraumatic.  Eyes: No scleral icterus.  Neck: Neck supple.  Cardiovascular: Normal rate.   Pulmonary/Chest: Effort normal.  Abdominal: Soft.  Neurological: She is alert and oriented to person, place, and time.  Skin: Skin is warm and dry.  Psychiatric: She has a normal mood and affect.        Assessment & Plan:   Problem List Items Addressed This Visit      Unprioritized   Lichen sclerosus - Primary    Begin using 2x/wk to mitigate symptoms--refills given.      Relevant Medications   clobetasol ointment (TEMOVATE) 0.05 %   Other Relevant Orders   Flu Vaccine QUAD 36+ mos IM (Completed)    Other Visit Diagnoses    Need for immunization against influenza       Relevant Orders   Flu Vaccine QUAD 36+ mos IM (Completed)      Total face-to-face time with patient: 10 minutes. Over 50% of encounter was spent on counseling and coordination of care. No Follow-up on file.  Donnamae Jude 02/06/2017 11:29 AM

## 2017-02-09 NOTE — Assessment & Plan Note (Signed)
Begin using 2x/wk to mitigate symptoms--refills given.

## 2017-02-24 ENCOUNTER — Other Ambulatory Visit: Payer: Self-pay | Admitting: Family Medicine

## 2017-03-03 ENCOUNTER — Other Ambulatory Visit: Payer: Self-pay | Admitting: Family Medicine

## 2017-03-04 ENCOUNTER — Other Ambulatory Visit: Payer: Self-pay | Admitting: Family Medicine

## 2017-03-06 ENCOUNTER — Encounter: Payer: Self-pay | Admitting: Family Medicine

## 2017-05-12 ENCOUNTER — Ambulatory Visit: Payer: Self-pay | Admitting: Registered Nurse

## 2017-05-12 VITALS — BP 146/83 | HR 72 | Temp 97.9°F

## 2017-05-12 DIAGNOSIS — N3001 Acute cystitis with hematuria: Secondary | ICD-10-CM

## 2017-05-12 DIAGNOSIS — R300 Dysuria: Secondary | ICD-10-CM

## 2017-05-12 LAB — POCT URINALYSIS DIPSTICK
Bilirubin, UA: NEGATIVE
Glucose, UA: NEGATIVE
Ketones, UA: NEGATIVE
NITRITE UA: NEGATIVE
PH UA: 6 (ref 5.0–8.0)
PROTEIN UA: NEGATIVE
Spec Grav, UA: 1.01 (ref 1.010–1.025)
UROBILINOGEN UA: 0.2 U/dL

## 2017-05-12 MED ORDER — PHENAZOPYRIDINE HCL 95 MG PO TABS: 95.0000 mg | ORAL_TABLET | Freq: Three times a day (TID) | ORAL | 0 refills | Status: AC | PRN

## 2017-05-12 MED ORDER — SULFAMETHOXAZOLE-TRIMETHOPRIM 800-160 MG PO TABS: 1.0000 | ORAL_TABLET | Freq: Two times a day (BID) | ORAL | 0 refills | Status: AC

## 2017-05-12 NOTE — Patient Instructions (Addendum)
Urinary Tract Infection, Adult A urinary tract infection (UTI) is an infection of any part of the urinary tract, which includes the kidneys, ureters, bladder, and urethra. These organs make, store, and get rid of urine in the body. UTI can be a bladder infection (cystitis) or kidney infection (pyelonephritis). What are the causes? This infection may be caused by fungi, viruses, or bacteria. Bacteria are the most common cause of UTIs. This condition can also be caused by repeated incomplete emptying of the bladder during urination. What increases the risk? This condition is more likely to develop if:  You ignore your need to urinate or hold urine for long periods of time.  You do not empty your bladder completely during urination.  You wipe back to front after urinating or having a bowel movement, if you are female.  You are uncircumcised, if you are female.  You are constipated.  You have a urinary catheter that stays in place (indwelling).  You have a weak defense (immune) system.  You have a medical condition that affects your bowels, kidneys, or bladder.  You have diabetes.  You take antibiotic medicines frequently or for long periods of time, and the antibiotics no longer work well against certain types of infections (antibiotic resistance).  You take medicines that irritate your urinary tract.  You are exposed to chemicals that irritate your urinary tract.  You are female.  What are the signs or symptoms? Symptoms of this condition include:  Fever.  Frequent urination or passing small amounts of urine frequently.  Needing to urinate urgently.  Pain or burning with urination.  Urine that smells bad or unusual.  Cloudy urine.  Pain in the lower abdomen or back.  Trouble urinating.  Blood in the urine.  Vomiting or being less hungry than normal.  Diarrhea or abdominal pain.  Vaginal discharge, if you are female.  How is this diagnosed? This condition is  diagnosed with a medical history and physical exam. You will also need to provide a urine sample to test your urine. Other tests may be done, including:  Blood tests.  Sexually transmitted disease (STD) testing.  If you have had more than one UTI, a cystoscopy or imaging studies may be done to determine the cause of the infections. How is this treated? Treatment for this condition often includes a combination of two or more of the following:  Antibiotic medicine.  Other medicines to treat less common causes of UTI.  Over-the-counter medicines to treat pain.  Drinking enough water to stay hydrated.  Follow these instructions at home:  Take over-the-counter and prescription medicines only as told by your health care provider.  If you were prescribed an antibiotic, take it as told by your health care provider. Do not stop taking the antibiotic even if you start to feel better.  Avoid alcohol, caffeine, tea, and carbonated beverages. They can irritate your bladder.  Drink enough fluid to keep your urine clear or pale yellow.  Keep all follow-up visits as told by your health care provider. This is important.  Make sure to: ? Empty your bladder often and completely. Do not hold urine for long periods of time. ? Empty your bladder before and after sex. ? Wipe from front to back after a bowel movement if you are female. Use each tissue one time when you wipe. Contact a health care provider if:  You have back pain.  You have a fever.  You feel nauseous or vomit.  Your symptoms do not  get better after 3 days.  Your symptoms go away and then return. Get help right away if:  You have severe back pain or lower abdominal pain.  You are vomiting and cannot keep down any medicines or water. This information is not intended to replace advice given to you by your health care provider. Make sure you discuss any questions you have with your health care provider. Document Released:  02/19/2005 Document Revised: 10/24/2015 Document Reviewed: 04/02/2015 Elsevier Interactive Patient Education  2017 [employer] Hematuria, Adult Hematuria is blood in your urine. It can be caused by a bladder infection, kidney infection, prostate infection, kidney stone, or cancer of your urinary tract. Infections can usually be treated with medicine, and a kidney stone usually will pass through your urine. If neither of these is the cause of your hematuria, further workup to find out the reason may be needed. It is very important that you tell your health care provider about any blood you see in your urine, even if the blood stops without treatment or happens without causing pain. Blood in your urine that happens and then stops and then happens again can be a symptom of a very serious condition. Also, pain is not a symptom in the initial stages of many urinary cancers. Follow these instructions at home:  Drink lots of fluid, 3-4 quarts a day. If you have been diagnosed with an infection, cranberry juice is especially recommended, in addition to large amounts of water.  Avoid caffeine, tea, and carbonated beverages because they tend to irritate the bladder.  Avoid alcohol because it may irritate the prostate.  Take all medicines as directed by your health care provider.  If you were prescribed an antibiotic medicine, finish it all even if you start to feel better.  If you have been diagnosed with a kidney stone, follow your health care provider's instructions regarding straining your urine to catch the stone.  Empty your bladder often. Avoid holding urine for long periods of time.  After a bowel movement, women should cleanse front to back. Use each tissue only once.  Empty your bladder before and after sexual intercourse if you are a female. Contact a health care provider if:  You develop back pain.  You have a fever.  You have a feeling of sickness in your stomach (nausea) or  vomiting.  Your symptoms are not better in 3 days. Return sooner if you are getting worse. Get help right away if:  You develop severe vomiting and are unable to keep the medicine down.  You develop severe back or abdominal pain despite taking your medicines.  You begin passing a large amount of blood or clots in your urine.  You feel extremely weak or faint, or you pass out. This information is not intended to replace advice given to you by your health care provider. Make sure you discuss any questions you have with your health care provider. Document Released: 05/12/2005 Document Revised: 10/18/2015 Document Reviewed: 01/10/2013 Elsevier Interactive Patient Education  2017 [place]. Kidney Stones Kidney stones (urolithiasis) are solid, rock-like deposits that form inside of the organs that make urine (kidneys). A kidney stone may form in a kidney and move into the bladder, where it can cause intense pain and block the flow of urine. Kidney stones are created when high levels of certain minerals are found in the urine. They are usually passed through urination, but in some cases, medical treatment may be needed to remove them. What are the causes?  Kidney stones may be caused by:  A condition in which certain glands produce too much parathyroid hormone (primary hyperparathyroidism), which causes too much calcium buildup in the blood.  Buildup of uric acid crystals in the bladder (hyperuricosuria). Uric acid is a chemical that the body produces when you eat certain foods. It usually exits the body in the urine.  Narrowing (stricture) of one or both of the tubes that drain urine from the kidneys to the bladder (ureters).  A kidney blockage that is present at birth (congenital obstruction).  Past surgery on the kidney or the ureters, such as gastric bypass surgery.  What increases the risk? The following factors make you more likely to develop kidney stones:  Having had a kidney  stone in the past.  Having a family history of kidney stones.  Not drinking enough water.  Eating a diet that is high in protein, salt (sodium), or sugar.  Being overweight or obese.  What are the signs or symptoms? Symptoms of a kidney stone may include:  Nausea.  Vomiting.  Blood in the urine (hematuria).  Pain in the side of the abdomen, right below the ribs (flank pain). Pain usually spreads (radiates) to the groin.  Needing to urinate frequently or urgently.  How is this diagnosed? This condition may be diagnosed based on:  Your medical history.  A physical exam.  Blood tests.  Urine tests.  CT scan.  Abdominal X-ray.  A procedure to examine the inside of the bladder (cystoscopy).  How is this treated? Treatment for kidney stones depends on the size, location, and makeup of the stones. Treatment may involve:  Analyzing your urine before and after you pass the stone through urination.  Being monitored at the hospital until you pass the stone through urination.  Increasing your fluid intake and decreasing the amount of calcium and protein in your diet.  A procedure to break up kidney stones in the bladder using: ? A focused beam of light (laser therapy). ? Shock waves (extracorporeal shock wave lithotripsy).  Surgery to remove kidney stones. This may be needed if you have severe pain or have stones that block your urinary tract.  Follow these instructions at home: Eating and drinking   Drink enough fluid to keep your urine clear or pale yellow. This will help you to pass the kidney stone.  If directed, change your diet. This may include: ? Limiting how much sodium you eat. ? Eating more fruits and vegetables. ? Limiting how much meat, poultry, fish, and eggs you eat.  Follow instructions from your health care provider about eating or drinking restrictions. General instructions  Collect urine samples as told by your health care provider. You may  need to collect a urine sample: ? 24 hours after you pass the stone. ? 8-12 weeks after passing the kidney stone, and every 6-12 months after that.  Strain your urine every time you urinate, for as long as directed. Use the strainer that your health care provider recommends.  Do not throw out the kidney stone after passing it. Keep the stone so it can be tested by your health care provider. Testing the makeup of your kidney stone may help prevent you from getting kidney stones in the future.  Take over-the-counter and prescription medicines only as told by your health care provider.  Keep all follow-up visits as told by your health care provider. This is important. You may need follow-up X-rays or ultrasounds to make sure that your stone has  passed. How is this prevented? To prevent another kidney stone:  Drink enough fluid to keep your urine clear or pale yellow. This is the best way to prevent kidney stones.  Eat a healthy diet and follow recommendations from your health care provider about foods to avoid. You may be instructed to eat a low-protein diet. Recommendations vary depending on the type of kidney stone that you have.  Maintain a healthy weight.  Contact a health care provider if:  You have pain that gets worse or does not get better with medicine. Get help right away if:  You have a fever or chills.  You develop severe pain.  You develop new abdominal pain.  You faint.  You are unable to urinate. This information is not intended to replace advice given to you by your health care provider. Make sure you discuss any questions you have with your health care provider. Document Released: 05/12/2005 Document Revised: 11/30/2015 Document Reviewed: 10/26/2015 Elsevier Interactive Patient Education  2017 [employer].

## 2017-05-12 NOTE — Progress Notes (Signed)
Subjective:    Patient ID: Laurie Tran, female    DOB: 1945-06-23, 71 y.o.   MRN: [id number]  71y/o caucasian female established patient c/o constant suprapubic pressure, and urinary urgency. Pt reports "it feels like when I'm passing a stone but I am not having any back pain/vomiting." Pain is low abdomen midline, not unilateral. Denies frank blood in urine, or flank pain. Sx x4 days.  Denied fever, smelly, cloudy urine, headache, or chills.  Last kidney stone Oct 21, 2016 required surgery was told she had another small stone at that time also.  Does not know of any antibiotic resistance with her urine cultures.  Has azo, antinausea and Rx pain medication at home in case she needs it for passing another stone.  Currently doesn't require any of these medications.      Review of Systems  Constitutional: Negative for activity change, appetite change, chills, diaphoresis, fatigue, fever and unexpected weight change.  Eyes: Negative for photophobia and visual disturbance.  Respiratory: Negative for cough and wheezing.   Cardiovascular: Negative for leg swelling.  Gastrointestinal: Positive for abdominal pain. Negative for abdominal distention, anal bleeding, blood in stool, constipation, diarrhea, nausea and vomiting.  Endocrine: Negative for cold intolerance and heat intolerance.  Genitourinary: Positive for urgency. Negative for decreased urine volume, difficulty urinating, enuresis, flank pain, frequency, genital sores, hematuria, menstrual problem, vaginal bleeding, vaginal discharge and vaginal pain.  Musculoskeletal: Negative for arthralgias, back pain, gait problem, joint swelling, myalgias, neck pain and neck stiffness.  Skin: Negative for color change, pallor, rash and wound.  Allergic/Immunologic: Positive for environmental allergies and immunocompromised state. Negative for food allergies.  Neurological: Negative for dizziness, tremors, seizures, syncope, facial asymmetry, speech  difficulty, weakness, light-headedness, numbness and headaches.  Psychiatric/Behavioral: Negative for agitation, confusion and sleep disturbance.       Objective:   Physical Exam  Constitutional: She is oriented to person, place, and time. Vital signs are normal. She appears well-developed and well-nourished. She is active and cooperative.  Non-toxic appearance. She does not have a sickly appearance. She does not appear ill. No distress.  HENT:  Head: Normocephalic and atraumatic.  Right Ear: Hearing and external ear normal.  Left Ear: Hearing and external ear normal.  Nose: Nose normal.  Mouth/Throat: Uvula is midline, oropharynx is clear and moist and mucous membranes are normal. No oropharyngeal exudate.  Eyes: Conjunctivae, EOM and lids are normal. Pupils are equal, round, and reactive to light. Right eye exhibits no discharge. Left eye exhibits no discharge. No scleral icterus.  Neck: Trachea normal, normal range of motion and phonation normal. Neck supple. No muscular tenderness present. No neck rigidity. No tracheal deviation, no edema, no erythema and normal range of motion present.  Cardiovascular: Normal rate, regular rhythm, normal heart sounds and intact distal pulses.  Pulses:      Radial pulses are 2+ on the right side, and 2+ on the left side.  Pulmonary/Chest: Effort normal and breath sounds normal. No accessory muscle usage or stridor. No respiratory distress. She has no decreased breath sounds. She has no wheezes. She has no rhonchi. She has no rales. She exhibits no tenderness.  Abdominal: Soft. Normal appearance. She exhibits no shifting dullness, no distension, no pulsatile liver, no fluid wave, no abdominal bruit, no ascites, no pulsatile midline mass and no mass. Bowel sounds are decreased. There is tenderness in the suprapubic area. There is no rigidity, no rebound, no guarding, no CVA tenderness, no tenderness at McBurney's point and negative  Murphy's sign. Hernia  confirmed negative in the ventral area.    Dull to percussion x 4 quads; hypoactive bowel sounds x 4 quads; suprapubic pressure and tenderness with palpation; negative bilateral CVA tenderness  Musculoskeletal: Normal range of motion. She exhibits no edema, tenderness or deformity.       Right shoulder: Normal.       Left shoulder: Normal.       Right elbow: Normal.      Left elbow: Normal.       Right hip: Normal.       Left hip: Normal.       Right knee: Normal.       Left knee: Normal.       Cervical back: Normal.       Thoracic back: Normal.       Lumbar back: Normal.       Right hand: Normal.       Left hand: Normal.  Lymphadenopathy:       Head (right side): No submental, no submandibular, no tonsillar, no preauricular, no posterior auricular and no occipital adenopathy present.       Head (left side): No submental, no submandibular, no tonsillar, no preauricular, no posterior auricular and no occipital adenopathy present.    She has no cervical adenopathy.       Right cervical: No superficial cervical adenopathy present.      Left cervical: No superficial cervical adenopathy present.  Neurological: She is alert and oriented to person, place, and time. She has normal strength. She is not disoriented. She displays no atrophy and no tremor. No cranial nerve deficit or sensory deficit. She exhibits normal muscle tone. She displays no seizure activity. Coordination and gait normal. GCS eye subscore is 4. GCS verbal subscore is 5. GCS motor subscore is 6.  On/off exam table; in/out of chair without difficulty; gait sure and steady in hallway  Skin: Skin is warm, dry and intact. No abrasion, no bruising, no burn, no ecchymosis, no laceration, no lesion, no petechiae and no rash noted. She is not diaphoretic. No cyanosis or erythema. No pallor. Nails show no clubbing.  Psychiatric: She has a normal mood and affect. Her speech is normal and behavior is normal. Judgment and thought content  normal. Cognition and memory are normal.  Nursing note and vitals reviewed.         Assessment & Plan:  A-hematuria with acute cystitis  P-PMHx kidney stones last flare May 2018 required surgery.  Urology appt pending next month follow up.  +LE and large blood dipstick ua today.  Bactrim DS po BID x 3 days #40 RF0 dispensed from [facility] to patient.  Urine culture ordered pending ship out discussed typically 48-72 hours for results to return; will contact patient once available.  Urine culture previous results mixed flora and e coli no resistence on record  dispensed from [facility] has azo at home discussed 1 po tid prn dysuria x 2 days and if needed antiemetic and pain medication at home.  Follow up Urology/ER if you think this is kidney stone/vomiting starts or unable to urinate every 8 hours.  Push po fluids to keep urine pale clear yellow tinged.  [facility]care handout on kidney stones, hematuria.  Discussed with patient metformin and bactrim DS can increase metformin levels therefore decreasing blood sugar levels eat lean protein each meal; regular meals and snacks encouraged.  Patient verbalized understanding information/instructions, agreed with plan of care and had no further questions at this time.

## 2017-05-14 NOTE — Progress Notes (Signed)
RN notified by staff at clinic site where UA sample was left for [facility] courier pickup, that sample was overlooked and did not get placed in box for pickup on 12/18. Sample has been unrefrigerated since collection and typically [facility] will only process unrefrigerated cultures within 24 hours. Per [facility] rep now, they are still processing it currently. Results likely available after the weekend.

## 2017-05-18 LAB — URINE CULTURE

## 2017-05-21 ENCOUNTER — Telehealth: Payer: Self-pay | Admitting: *Deleted

## 2017-05-21 NOTE — Telephone Encounter (Signed)
Pt emailed RN on 12/24. RN received today upon returning to clinic after Christmas holiday. She reports taking Bactrim that was Rx'd on 12/18 x3 days with no issue, however the next day after completing course of abx (12/21), she reports tongue swelling with open sores present, ShOB, and difficulty swallowing. She did not state in email if she sought care elsewhere for this as it occurred over the weekend. Otila Kluver, NP made aware via phone, however she is out of state until 1/3. No need to retreat with different abx as pt completed full course and urine cx reports sensitivity to Bactrim. Per pt's away message, she is out of the office until 1/3 as well. Email returned with instructions to f/u in clinic on 1/3 for repeat UA dip. Allergy list in epic updated to include Bactrim allergy.

## 2017-05-28 ENCOUNTER — Encounter: Payer: Self-pay | Admitting: *Deleted

## 2017-05-28 MED ORDER — EPINEPHRINE 0.15 MG/0.3ML IJ SOAJ: 0.1500 mg | INTRAMUSCULAR | 1 refills | Status: DC | PRN

## 2017-05-28 NOTE — Telephone Encounter (Signed)
Patient contacted and she stated she thought she had thrush plus allergic reaction as she had diflucan one tab at home and she took it and started to feel better along with benadryl OTC po prn x7 days.  Rx given to patient paper and electronic for epipen IM x 1 prn anaphylaxis and may repeat x 1 in 15 minutes #1 RF1.  Patient to follow up with PCM.  Urinary symptoms and mouth sores/tongue swelling have completely resolved.  Patient given handout on epinephrine injection, allergic reaction and thrush oral.  Patient to follow up with PCM.  Patient verbalized understanding information/instructions, agreed with plan of care and had no further questions at this time.

## 2017-05-29 NOTE — Progress Notes (Signed)
As of 05/28/17, pt reports no suprapubic or urinary pressure or pain. Reminded pt if sx return, to return to clinic for repeat UA. Verbalizes understanding and agreement.

## 2017-06-02 ENCOUNTER — Encounter: Payer: Self-pay | Admitting: Registered Nurse

## 2017-06-02 ENCOUNTER — Telehealth: Payer: Self-pay | Admitting: Registered Nurse

## 2017-06-02 MED ORDER — EPINEPHRINE 0.3 MG/0.3ML IJ SOAJ: 0.3000 mg | Freq: Once | INTRAMUSCULAR | 1 refills | Status: AC

## 2017-06-02 NOTE — Telephone Encounter (Signed)
Received fax from [facility] on epipen jr rx recommended 0.3mg /0.60ml dosage instead of 0.15mg /84ml.  Reviewed manufacturer instructions and patient greater than 66 lbs cancelled previous Rx and new electronic Rx sent for patient epipen 0.3mg /ml inject IM and may repeat x 1 in 15 minutes #1 RF1

## 2017-11-05 ENCOUNTER — Telehealth: Payer: Self-pay | Admitting: *Deleted

## 2017-11-05 NOTE — Telephone Encounter (Signed)
Copied from [place] [phone]. Topic: General - Other >> Nov 05, 2017 11:10 AM Ivar Drape wrote: Reason for CRM:   Patient is having a physical on 01/27/18 and she would like to have the labs done at her place of employment.  She will need a prescription for the labs.

## 2017-11-05 NOTE — Telephone Encounter (Signed)
Where does she work ?- if [facility] I can put them in electronically

## 2017-11-05 NOTE — Telephone Encounter (Signed)
Left VM requesting pt to call the office back CRM created 

## 2017-11-11 NOTE — Telephone Encounter (Signed)
Lm on pt vm requesting a call back 

## 2017-11-17 NOTE — Telephone Encounter (Signed)
Works at [employer]), can get labs done there will need paper orders given to her once Dr. Glori Bickers returns

## 2017-11-19 ENCOUNTER — Ambulatory Visit: Payer: Self-pay | Admitting: Medical

## 2017-11-19 VITALS — BP 132/81 | HR 79 | Temp 98.2°F

## 2017-11-19 DIAGNOSIS — R197 Diarrhea, unspecified: Secondary | ICD-10-CM

## 2017-11-19 DIAGNOSIS — W57XXXA Bitten or stung by nonvenomous insect and other nonvenomous arthropods, initial encounter: Secondary | ICD-10-CM

## 2017-11-19 NOTE — Progress Notes (Signed)
72y/o Caucasian female pt c/o loose, watery stools x3 days. Approx 2-3 episodes a day. Associated abd cramping just before the BMs. Denies blood in stool. Reports "queasy and upset stomach" most of the day x3 days. Decreased appetite, nausea. Sts any po intake results in BM quickly thereafter. Reports colonoscopy performed 6/21. No BMs or s/sx until 6/24 when these sx began. No OTC tx at home for sx.   Subjective:    Patient ID: Laurie Tran, female    DOB: 05-Nov-1945, 72 y.o.   MRN: [id number]  HPI  72 yo female in non acute distress.  Patient with colonoscopy on Friday. Did not like the solution given to her for colon cleanse.Tells me that her colonoscopy was with in normal limits No Bowel movement for  2 days, them started on Tuesday with  2-3 times a day with formed /diarrea movements cramping just before bowel movements, Denies any blood in stool or toilet.Today she feels a little better.   She is traveling [place] tomorrow and did not want to be sick for her trip.  She also states she removed a tick from the crease of the back of left knee  3 days ago, the tick was brown with white spot. The tick was not engorged.  Blood pressure 132/81, pulse 79, temperature 98.2 F (36.8 C), temperature source Tympanic, SpO2 98 %.  Review of Systems  Constitutional: Positive for appetite change (decreased, ate chicken salad and was abple to keep it down.). Negative for chills and fever.  Gastrointestinal: Positive for abdominal pain (cramping before having a bowel movement), diarrhea (some formed  stool some liquid) and nausea. Negative for abdominal distention, blood in stool and constipation.  Neurological: Positive for headaches.       Objective:   Physical Exam  Constitutional: She is oriented to person, place, and time. She appears well-developed and well-nourished.  HENT:  Head: Normocephalic and atraumatic.  Eyes: Pupils are equal, round, and reactive to light. Conjunctivae and EOM are  normal.  Neck: Normal range of motion.  Cardiovascular: Normal rate, regular rhythm and normal heart sounds.  Pulmonary/Chest: Effort normal and breath sounds normal.  Abdominal: Soft. She exhibits no distension. Bowel sounds are increased. There is no tenderness. There is no guarding.  Neurological: She is alert and oriented to person, place, and time.  Skin: Skin is warm and dry.  Psychiatric: She has a normal mood and affect. Her behavior is normal. Judgment and thought content normal.  Nursing note and vitals reviewed.     petite thin female Assessment & Plan:  Diarrhea s/p colonoscopy cleanse for colonoscopy last Friday. To switch to a bland diet to help with nausea, increase fluids, add gatorade to diet to help with electrolyte replacement. Seek out medical care if diarrhea increased while traveling. Tick bite to clean BID with dilute soap and water tot he site and neosporin to the area, if wearing long skirts or pants to cover with a bandage. If bullseye rash or  Erythema and pain to seek out medical care while on your trip for further evaluation.AVS included tick bite information. Patient verbalizes understand has no questions at discharge.

## 2017-11-19 NOTE — Patient Instructions (Signed)
Diarrhea, Adult Diarrhea is when you have loose and water poop (stool) often. Diarrhea can make you feel weak and cause you to get dehydrated. Dehydration can make you tired and thirsty, make you have a dry mouth, and make it so you pee (urinate) less often. Diarrhea often lasts 2-3 days. However, it can last longer if it is a sign of something more serious. It is important to treat your diarrhea as told by your doctor. Follow these instructions at home: Eating and drinking  Follow these recommendations as told by your doctor:  Take an oral rehydration solution (ORS). This is a drink that is sold at pharmacies and stores.  Drink clear fluids, such as: ? Water. ? Ice chips. ? Diluted fruit juice. ? Low-calorie sports drinks.  Eat bland, easy-to-digest foods in small amounts as you are able. These foods include: ? Bananas. ? Applesauce. ? Rice. ? Low-fat (lean) meats. ? Toast. ? Crackers.  Avoid drinking fluids that have a lot of sugar or caffeine in them.  Avoid alcohol.  Avoid spicy or fatty foods.  General instructions   Drink enough fluid to keep your pee (urine) clear or pale yellow.  Wash your hands often. If you cannot use soap and water, use hand sanitizer.  Make sure that all people in your home wash their hands well and often.  Take over-the-counter and prescription medicines only as told by your doctor.  Rest at home while you get better.  Watch your condition for any changes.  Take a warm bath to help with any burning or pain from having diarrhea.  Keep all follow-up visits as told by your doctor. This is important. Contact a doctor if:  You have a fever.  Your diarrhea gets worse.  You have new symptoms.  You cannot keep fluids down.  You feel light-headed or dizzy.  You have a headache.  You have muscle cramps. Get help right away if:  You have chest pain.  You feel very weak or you pass out (faint).  You have bloody or black poop or  poop that look like tar.  You have very bad pain, cramping, or bloating in your belly (abdomen).  You have trouble breathing or you are breathing very quickly.  Your heart is beating very quickly.  Your skin feels cold and clammy.  You feel confused.  You have signs of dehydration, such as: ? Dark pee, hardly any pee, or no pee. ? Cracked lips. ? Dry mouth. ? Sunken eyes. ? Sleepiness. ? Weakness. This information is not intended to replace advice given to you by your health care provider. Make sure you discuss any questions you have with your health care provider. Document Released: 10/29/2007 Document Revised: 11/30/2015 Document Reviewed: 01/16/2015 Elsevier Interactive Patient Education  2018 [place], Adult Ticks are insects that can bite. Most ticks live in shrubs and grassy areas. They climb onto people and animals that go by. Then they bite. Some ticks carry germs that can make you sick. How can I prevent tick bites?  Use an insect repellent that has 20% or higher of the ingredients DEET, picaridin, or IR3535. Put this insect repellent on: ? Bare skin. ? The tops of your boots. ? Your pant legs. ? The ends of your sleeves.  If you use an insect repellent that has the ingredient permethrin, make sure to follow the instructions on the bottle. Treat the following: ? Clothing. ? Supplies. ? Boots. ? Tents.  Wear long  sleeves, long pants, and light colors.  Tuck your pant legs into your socks.  Stay in the middle of the trail.  Try not to walk through long grass.  Before going inside your house, check your clothes, hair, and skin for ticks. Make sure to check your head, neck, armpits, waist, groin, and joint areas.  Check for ticks every day.  When you come indoors: ? Wash your clothes right away. ? Shower right away. ? Dry your clothes in a dryer on high heat for 60 minutes or more. What is the right way to remove a tick? Remove a  tick from your skin as soon as possible.  To remove a tick that is crawling on your skin: ? Go outdoors and brush the tick off. ? Use tape or a lint roller.  To remove a tick that is biting: ? Wash your hands. ? If you have latex gloves, put them on. ? Use tweezers, curved forceps, or a tick-removal tool to grasp the tick. Grasp the tick as close to your skin and as close to the tick's head as possible. ? Gently pull up until the tick lets go.  Try to keep the tick's head attached to its body.  Do not twist or jerk the tick.  Do not squeeze or crush the tick.  Do not try to remove a tick with heat, alcohol, petroleum jelly, or fingernail polish. How should I get rid of a tick? Here are some ways to get rid of a tick that is alive:  Place the tick in rubbing alcohol.  Place the tick in a bag or container you can close tightly.  Wrap the tick tightly in tape.  Flush the tick down the toilet.  Contact a doctor if:  You have symptoms of a disease, such as: ? Pain in a muscle, joint, or bone. ? Trouble walking or moving your legs. ? Numbness in your legs. ? Inability to move (paralysis). ? A red rash that makes a circle (bull's-eye rash). ? Redness and swelling where the tick bit you. ? A fever. ? Throwing up (vomiting) over and over. ? Diarrhea. ? Weight loss. ? Tender and swollen lymph glands. ? Shortness of breath. ? Cough. ? Belly pain (abdominal pain). ? Headache. ? Being more tired than normal. ? A change in how alert (conscious) you are. ? Confusion. Get help right away if:  You cannot remove a tick.  A part of a tick breaks off and gets stuck in your skin.  You are feeling worse. Summary  Ticks may carry germs that can make you sick.  To prevent tick bites, wear long sleeves, long pants, and light colors. Use insect repellent. Follow the instructions on the bottle.  If the tick is biting, do not try to remove it with heat, alcohol, petroleum jelly,  or fingernail polish.  Use tweezers, curved forceps, or a tick-removal tool to grasp the tick. Gently pull up until the tick lets go. Do not twist or jerk the tick. Do not squeeze or crush the tick.  If you have symptoms, contact a doctor. This information is not intended to replace advice given to you by your health care provider. Make sure you discuss any questions you have with your health care provider. Document Released: 08/06/2009 Document Revised: 08/22/2016 Document Reviewed: 08/22/2016 Elsevier Interactive Patient Education  2018 [place] Diet A bland diet consists of foods that do not have a lot of fat or fiber. Foods without fat  or fiber are easier for the body to digest. They are also less likely to irritate your mouth, throat, stomach, and other parts of your gastrointestinal tract. A bland diet is sometimes called a BRAT diet. What is my plan? Your health care provider or dietitian may recommend specific changes to your diet to prevent and treat your symptoms, such as:  Eating small meals often.  Cooking food until it is soft enough to chew easily.  Chewing your food well.  Drinking fluids slowly.  Not eating foods that are very spicy, sour, or fatty.  Not eating citrus fruits, such as oranges and grapefruit.  What do I need to know about this diet?  Eat a variety of foods from the bland diet food list.  Do not follow a bland diet longer than you have to.  Ask your health care provider whether you should take vitamins. What foods can I eat? Grains  Hot cereals, such as cream of wheat. Bread, crackers, or tortillas made from refined white flour. Rice. Vegetables Canned or cooked vegetables. Mashed or boiled potatoes. Fruits Bananas. Applesauce. Other types of cooked or canned fruit with the skin and seeds removed, such as canned peaches or pears. Meats and Other Protein Sources Scrambled eggs. Creamy peanut butter or other nut butters. Lean,  well-cooked meats, such as chicken or fish. Tofu. Soups or broths. Dairy Low-fat dairy products, such as milk, cottage cheese, or yogurt. Beverages Water. Herbal tea. Apple juice. Sweets and Desserts Pudding. Custard. Fruit gelatin. Ice cream. Fats and Oils Mild salad dressings. Canola or olive oil. The items listed above may not be a complete list of allowed foods or beverages. Contact your dietitian for more options. What foods are not recommended? Foods and ingredients that are often not recommended include:  Spicy foods, such as hot sauce or salsa.  Fried foods.  Sour foods, such as pickled or fermented foods.  Raw vegetables or fruits, especially citrus or berries.  Caffeinated drinks.  Alcohol.  Strongly flavored seasonings or condiments.  The items listed above may not be a complete list of foods and beverages that are not allowed. Contact your dietitian for more information. This information is not intended to replace advice given to you by your health care provider. Make sure you discuss any questions you have with your health care provider. Document Released: 09/03/2015 Document Revised: 10/18/2015 Document Reviewed: 05/24/2014 Elsevier Interactive Patient Education  2018 [employer].

## 2017-11-30 ENCOUNTER — Other Ambulatory Visit: Payer: Self-pay | Admitting: *Deleted

## 2017-11-30 MED ORDER — ATORVASTATIN CALCIUM 10 MG PO TABS: 10.0000 mg | ORAL_TABLET | Freq: Every day | ORAL | 0 refills | Status: DC

## 2018-01-18 ENCOUNTER — Ambulatory Visit: Payer: Self-pay | Admitting: *Deleted

## 2018-01-18 DIAGNOSIS — Z0000 Encounter for general adult medical examination without abnormal findings: Secondary | ICD-10-CM

## 2018-01-18 NOTE — Progress Notes (Signed)
Labs for upcoming annual exam with pcp.

## 2018-01-19 LAB — CMP12+LP+TP+TSH+6AC+CBC/D/PLT
A/G RATIO: 2 (ref 1.2–2.2)
ALK PHOS: 124 IU/L — AB (ref 39–117)
ALT: 20 IU/L (ref 0–32)
AST: 23 IU/L (ref 0–40)
Albumin: 4.7 g/dL (ref 3.5–4.8)
BASOS ABS: 0 10*3/uL (ref 0.0–0.2)
BILIRUBIN TOTAL: 0.4 mg/dL (ref 0.0–1.2)
BUN / CREAT RATIO: 20 (ref 12–28)
BUN: 16 mg/dL (ref 8–27)
Basos: 0 %
CHOL/HDL RATIO: 2.9 ratio (ref 0.0–4.4)
CREATININE: 0.8 mg/dL (ref 0.57–1.00)
Calcium: 9.8 mg/dL (ref 8.7–10.3)
Chloride: 100 mmol/L (ref 96–106)
Cholesterol, Total: 171 mg/dL (ref 100–199)
EOS (ABSOLUTE): 0.2 10*3/uL (ref 0.0–0.4)
EOS: 2 %
Estimated CHD Risk: <0.5 times avg. (ref 0.0–1.0)
Free Thyroxine Index: 2 (ref 1.2–4.9)
GFR calc non Af Amer: 75 mL/min/{1.73_m2} (ref >59)
GFR, EST AFRICAN AMERICAN: 86 mL/min/{1.73_m2} (ref >59)
GGT: 4 IU/L (ref 0–60)
GLOBULIN, TOTAL: 2.3 g/dL (ref 1.5–4.5)
GLUCOSE: 98 mg/dL (ref 65–99)
HDL: 58 mg/dL (ref >39)
HEMOGLOBIN: 13.1 g/dL (ref 11.1–15.9)
Hematocrit: 39.6 % (ref 34.0–46.6)
IMMATURE GRANS (ABS): 0 10*3/uL (ref 0.0–0.1)
Immature Granulocytes: 0 %
Iron: 96 ug/dL (ref 27–139)
LDH: 189 IU/L (ref 119–226)
LDL Calculated: 93 mg/dL (ref 0–99)
LYMPHS: 27 %
Lymphocytes Absolute: 2.3 10*3/uL (ref 0.7–3.1)
MCH: 29.1 pg (ref 26.6–33.0)
MCHC: 33.1 g/dL (ref 31.5–35.7)
MCV: 88 fL (ref 79–97)
Monocytes Absolute: 0.4 10*3/uL (ref 0.1–0.9)
Monocytes: 5 %
Neutrophils Absolute: 5.7 10*3/uL (ref 1.4–7.0)
Neutrophils: 66 %
PLATELETS: 273 10*3/uL (ref 150–450)
Phosphorus: 3.4 mg/dL (ref 2.5–4.5)
Potassium: 4.3 mmol/L (ref 3.5–5.2)
RBC: 4.5 x10E6/uL (ref 3.77–5.28)
RDW: 13.8 % (ref 12.3–15.4)
Sodium: 140 mmol/L (ref 134–144)
T3 UPTAKE RATIO: 22 % — AB (ref 24–39)
T4 TOTAL: 9.3 ug/dL (ref 4.5–12.0)
TRIGLYCERIDES: 101 mg/dL (ref 0–149)
TSH: 3.33 u[IU]/mL (ref 0.450–4.500)
Total Protein: 7 g/dL (ref 6.0–8.5)
URIC ACID: 4.5 mg/dL (ref 2.5–7.1)
VLDL CHOLESTEROL CAL: 20 mg/dL (ref 5–40)
WBC: 8.7 10*3/uL (ref 3.4–10.8)

## 2018-01-19 LAB — VITAMIN D 25 HYDROXY (VIT D DEFICIENCY, FRACTURES): Vit D, 25-Hydroxy: 60.6 ng/mL (ref 30.0–100.0)

## 2018-01-19 LAB — HGB A1C W/O EAG: Hgb A1c MFr Bld: 5.8 % — ABNORMAL HIGH (ref 4.8–5.6)

## 2018-01-27 ENCOUNTER — Ambulatory Visit (INDEPENDENT_AMBULATORY_CARE_PROVIDER_SITE_OTHER): Payer: No Typology Code available for payment source | Admitting: Family Medicine

## 2018-01-27 ENCOUNTER — Encounter: Payer: Self-pay | Admitting: Family Medicine

## 2018-01-27 VITALS — BP 148/70 | HR 64 | Temp 98.6°F | Resp 16 | Ht 63.75 in | Wt 122.2 lb

## 2018-01-27 DIAGNOSIS — Z23 Encounter for immunization: Secondary | ICD-10-CM | POA: Diagnosis not present

## 2018-01-27 DIAGNOSIS — E118 Type 2 diabetes mellitus with unspecified complications: Secondary | ICD-10-CM | POA: Diagnosis not present

## 2018-01-27 DIAGNOSIS — I10 Essential (primary) hypertension: Secondary | ICD-10-CM | POA: Diagnosis not present

## 2018-01-27 DIAGNOSIS — E7800 Pure hypercholesterolemia, unspecified: Secondary | ICD-10-CM | POA: Diagnosis not present

## 2018-01-27 DIAGNOSIS — Z0000 Encounter for general adult medical examination without abnormal findings: Secondary | ICD-10-CM | POA: Diagnosis not present

## 2018-01-27 DIAGNOSIS — L900 Lichen sclerosus et atrophicus: Secondary | ICD-10-CM

## 2018-01-27 MED ORDER — ATORVASTATIN CALCIUM 10 MG PO TABS: 10.0000 mg | ORAL_TABLET | Freq: Every day | ORAL | 3 refills | Status: DC

## 2018-01-27 MED ORDER — METFORMIN HCL 500 MG PO TABS: 500.0000 mg | ORAL_TABLET | Freq: Every day | ORAL | 3 refills | Status: DC

## 2018-01-27 MED ORDER — MOMETASONE FUROATE 50 MCG/ACT NA SUSP: 2.0000 | Freq: Every day | NASAL | 11 refills | Status: DC | PRN

## 2018-01-27 MED ORDER — POTASSIUM CHLORIDE ER 10 MEQ PO TBCR: 10.0000 meq | EXTENDED_RELEASE_TABLET | Freq: Every day | ORAL | 11 refills | Status: DC

## 2018-01-27 MED ORDER — HYDROCHLOROTHIAZIDE 25 MG PO TABS: 25.0000 mg | ORAL_TABLET | Freq: Every day | ORAL | 3 refills | Status: DC

## 2018-01-27 MED ORDER — FEXOFENADINE HCL 180 MG PO TABS: ORAL_TABLET | ORAL | 3 refills | Status: DC

## 2018-01-27 NOTE — Patient Instructions (Addendum)
Don't forget to make your mammogram appt   Increase hctz to 25 mg daily  If any side effects alert me  Take potassium 10 meq one pill daily   Follow up in 2-3 weeks for blood pressure  Get your labs at work several days before   Keep up the good health habits

## 2018-01-27 NOTE — Assessment & Plan Note (Signed)
Disc goals for lipids and reasons to control them Rev last labs with pt Rev low sat fat diet in detail Well controlled/stable with atorvastatin and diet

## 2018-01-27 NOTE — Assessment & Plan Note (Signed)
Reviewed health habits including diet and exercise and skin cancer prevention Reviewed appropriate screening tests for age  Also reviewed health mt list, fam hx and immunization status , as well as social and family history   See HPI Labs reviewed Pt will make her own mammogram appt  Flu shot today  Inc hctz for bp and arrange f/u

## 2018-01-27 NOTE — Assessment & Plan Note (Signed)
bp in fair control at this time  BP Readings from Last 1 Encounters:  01/27/18 (!) 148/70   Increase hctz to 25 mg daily  Also Kdur 10 meq daily (as this dose has caused hypokalemia in the past) Most recent labs reviewed  Disc lifstyle change with low sodium diet and exercise f/u in 2-3 weeks

## 2018-01-27 NOTE — Assessment & Plan Note (Signed)
Pt uses clobetasol sparingly prn

## 2018-01-27 NOTE — Progress Notes (Signed)
Subjective:    Patient ID: Laurie Tran, female    DOB: Jun 24, 1945, 72 y.o.   MRN: [id number]  HPI Here for health maintenance exam and to review chronic medical problems   Went to [place] this summer for a wedding and [place]  Had a good summer  Feeling good Taking care of herself   Wt Readings from Last 3 Encounters:  01/27/18 122 lb 4 oz (55.5 kg)  02/06/17 126 lb (57.2 kg)  01/19/17 124 lb 4 oz (56.4 kg)  eats healthy  Swims and walks a lot  Also has an elliptical and weight machine  Lot of gardening as well  21.15 kg/m   Has 2 new puppies as well   Had a flu shot today   Eye exam -thinks up to date   Mammogram 8/18 neg- she has a reminder  Self breast exam- no lumps  Sister and mother had breast cancer   Tetanus shot 1/13  Colonoscopy 6/19 neg with 5 y recall  Brother had colon cancer   zostavax 9/13  dexa 2/10 -heel normal  10/18- BMD normal  Takes D (intol of calcium)  No falls or fractures  Exercises   Hx of vulvar lichen sclerosis  Was seen last September  Has been well controlled  Using it once every 6 weeks as needed   bp is up on first check today No cp or palpitations or headaches or edema  No side effects to medicines  BP Readings from Last 3 Encounters:  01/27/18 (!) 164/72  11/19/17 132/81  05/12/17 (!) 146/83  running higher at home 329 systolic  Takes hctz 51.8 mg   DM2 Lab Results  Component Value Date   HGBA1C 5.8 (H) 01/18/2018   Hyperlipidemia Lab Results  Component Value Date   CHOL 171 01/18/2018   CHOL 173 12/26/2016   CHOL 155 12/28/2015   Lab Results  Component Value Date   HDL 58 01/18/2018   HDL 66 12/26/2016   HDL 54 12/28/2015   Lab Results  Component Value Date   LDLCALC 93 01/18/2018   LDLCALC 94 12/26/2016   LDLCALC 80 12/28/2015   Lab Results  Component Value Date   TRIG 101 01/18/2018   TRIG 67 12/26/2016   TRIG 104 12/28/2015   Lab Results  Component Value Date   CHOLHDL 2.9 01/18/2018    CHOLHDL 2.6 12/26/2016   CHOLHDL 2.9 12/28/2015   No results found for: LDLDIRECT Atorvastatin and diet   Labs done by RN for preventative health care Lab Results  Component Value Date   CREATININE 0.80 01/18/2018   BUN 16 01/18/2018   NA 140 01/18/2018   K 4.3 01/18/2018   CL 100 01/18/2018   CO2 27 09/30/2016   Lab Results  Component Value Date   ALT 20 01/18/2018   AST 23 01/18/2018   GGT 4 01/18/2018   ALKPHOS 124 (H) 01/18/2018   BILITOT 0.4 01/18/2018   Lab Results  Component Value Date   TSH 3.330 01/18/2018    Lab Results  Component Value Date   WBC 8.7 01/18/2018   HGB 13.1 01/18/2018   HCT 39.6 01/18/2018   MCV 88 01/18/2018   PLT 273 01/18/2018    Patient Active Problem List   Diagnosis Date Noted  . Encounter for hepatitis C screening test for low risk patient 01/19/2017  . Estrogen deficiency 01/19/2017  . Lichen sclerosus 84/16/6063  . H/O herpes zoster 11/13/2011  . Routine general medical examination at  a health care facility 05/30/2011  . Other screening mammogram 05/30/2011  . Colon polyps 05/30/2011  . Type 2 diabetes mellitus with complication, without long-term current use of insulin ([facility]) 10/29/2010  . GERD (gastroesophageal reflux disease) 10/29/2010  . HTN (hypertension) 10/29/2010  . Hyperlipidemia 10/29/2010  . History of kidney stones 10/29/2010  . Perennial allergic rhinitis 10/29/2010   Past Medical History:  Diagnosis Date  . Allergy   . Diabetes mellitus 2007   type II  . Dysrhythmia 1998   "Fast heart rate. Used medication to stop it and restart. "  . GERD (gastroesophageal reflux disease)   . History of chicken pox   . History of kidney stones   . History of UTI   . Hypertension    Past Surgical History:  Procedure Laterality Date  . BREAST SURGERY  1085   breast biopsy benign  . CYSTOSCOPY WITH RETROGRADE PYELOGRAM, URETEROSCOPY AND STENT PLACEMENT Left 10/21/2016   Procedure: CYSTOSCOPY WITH RETROGRADE  PYELOGRAM, LEFT URETEROSCOPY PYELOSCOPY  AND LEFT STENT PLACEMENT;  Surgeon: Carolan Clines, MD;  Location: [facility];  Service: Urology;  Laterality: Left;  . j basket retrieval     for kidney stones  . lipotripsy    . MENISCUS REPAIR  2006   rt knee   Social History   Tobacco Use  . Smoking status: Never Smoker  . Smokeless tobacco: Never Used  Substance Use Topics  . Alcohol use: No    Alcohol/week: 0.0 standard drinks  . Drug use: No   Family History  Problem Relation Age of Onset  . Cancer Mother        breast CA  . Arthritis Paternal Grandmother   . Stroke Paternal Grandmother   . Diabetes Paternal Grandmother   . Cancer Sister   . Breast cancer Sister   . Colon cancer Brother    Allergies  Allergen Reactions  . Nsaids Anaphylaxis    Breathing problems   . Bactrim [Sulfamethoxazole-Trimethoprim]     Pt reported tongue swelling with open sores, ShOB, difficulty swallowing  . Celebrex [Celecoxib] Hives    And congestion  . Sulfa Antibiotics     Pt reports tongue swelling, ShOB, difficulty swallowing  . Vioxx [Rofecoxib] Hives    And congestion  . Ibuprofen Other (See Comments)    Reactive airways    Current Outpatient Medications on File Prior to Visit  Medication Sig Dispense Refill  . Cholecalciferol (VITAMIN D-3) 1000 units CAPS Take 2 capsules by mouth daily.    . clobetasol ointment (TEMOVATE) 0.05 % Apply at bedtime to the affected area 60 g 5  . Magnesium 400 MG TABS Take 1 tablet by mouth daily.    . Multiple Vitamins-Minerals (HAIR/SKIN/NAILS/BIOTIN PO) Take 1 capsule by mouth daily.    . Multiple Vitamins-Minerals (PRESERVISION AREDS 2 PO) Take 1 capsule by mouth daily.    . Nutritional Supplements (PYCNOGENOL PO) Take by mouth.    . Omega-3 Fatty Acids (FISH OIL ULTRA) 1400 MG CAPS Take 1 capsule by mouth daily.    . vitamin B-12 (CYANOCOBALAMIN) 1000 MCG tablet Take 1,000 mcg by mouth daily.     No current facility-administered medications on  file prior to visit.     Review of Systems  Constitutional: Negative for activity change, appetite change, fatigue, fever and unexpected weight change.  HENT: Negative for congestion, ear pain, rhinorrhea, sinus pressure and sore throat.   Eyes: Negative for pain, redness and visual disturbance.  Respiratory: Negative for cough, shortness  of breath and wheezing.   Cardiovascular: Negative for chest pain and palpitations.  Gastrointestinal: Negative for abdominal pain, blood in stool, constipation and diarrhea.  Endocrine: Negative for polydipsia and polyuria.  Genitourinary: Negative for dysuria, frequency and urgency.  Musculoskeletal: Negative for arthralgias, back pain and myalgias.  Skin: Negative for pallor and rash.  Allergic/Immunologic: Negative for environmental allergies.  Neurological: Negative for dizziness, syncope and headaches.  Hematological: Negative for adenopathy. Does not bruise/bleed easily.  Psychiatric/Behavioral: Negative for decreased concentration and dysphoric mood. The patient is not nervous/anxious.        Objective:   Physical Exam  Constitutional: She appears well-developed and well-nourished. No distress.  Well appearing   HENT:  Head: Normocephalic and atraumatic.  Right Ear: External ear normal.  Left Ear: External ear normal.  Mouth/Throat: Oropharynx is clear and moist.  Eyes: Pupils are equal, round, and reactive to light. Conjunctivae and EOM are normal. No scleral icterus.  Neck: Normal range of motion. Neck supple. No JVD present. Carotid bruit is not present. No thyromegaly present.  Cardiovascular: Normal rate, regular rhythm, normal heart sounds and intact distal pulses. Exam reveals no gallop.  Pulmonary/Chest: Effort normal and breath sounds normal. No respiratory distress. She has no wheezes. She has no rales. She exhibits no tenderness. No breast tenderness, discharge or bleeding.  Abdominal: Soft. Bowel sounds are normal. She exhibits  no distension, no abdominal bruit and no mass. There is no tenderness.  Genitourinary: No breast tenderness, discharge or bleeding.  Genitourinary Comments: Breast exam: No mass, nodules, thickening, tenderness, bulging, retraction, inflamation, nipple discharge or skin changes noted.  No axillary or clavicular LA.      Musculoskeletal: Normal range of motion. She exhibits no edema or tenderness.  Lymphadenopathy:    She has no cervical adenopathy.  Neurological: She is alert. She has normal reflexes. She displays normal reflexes. No cranial nerve deficit. She exhibits normal muscle tone. Coordination normal.  Skin: Skin is warm and dry. No rash noted. No erythema. No pallor.  Solar lentigines diffusely   Psychiatric: She has a normal mood and affect.          Assessment & Plan:   Problem List Items Addressed This Visit      Cardiovascular and Mediastinum   HTN (hypertension) - Primary    bp in fair control at this time  BP Readings from Last 1 Encounters:  01/27/18 (!) 148/70   Increase hctz to 25 mg daily  Also Kdur 10 meq daily (as this dose has caused hypokalemia in the past) Most recent labs reviewed  Disc lifstyle change with low sodium diet and exercise f/u in 2-3 weeks        Relevant Medications   hydrochlorothiazide (HYDRODIURIL) 25 MG tablet   atorvastatin (LIPITOR) 10 MG tablet     Endocrine   Type 2 diabetes mellitus with complication, without long-term current use of insulin ([facility])    Lab Results  Component Value Date   HGBA1C 5.8 (H) 01/18/2018   Well controlled Sent for last DM eye exam report-per pt is utd  Nl foot exam  Good diet and wt control  Enc exercise       Relevant Medications   atorvastatin (LIPITOR) 10 MG tablet   metFORMIN (GLUCOPHAGE) 500 MG tablet     Musculoskeletal and Integument   Lichen sclerosus    Pt uses clobetasol sparingly prn         Other   Hyperlipidemia    Disc goals for  lipids and reasons to control them Rev  last labs with pt Rev low sat fat diet in detail Well controlled/stable with atorvastatin and diet       Relevant Medications   hydrochlorothiazide (HYDRODIURIL) 25 MG tablet   atorvastatin (LIPITOR) 10 MG tablet   Routine general medical examination at a health care facility    Reviewed health habits including diet and exercise and skin cancer prevention Reviewed appropriate screening tests for age  Also reviewed health mt list, fam hx and immunization status , as well as social and family history   See HPI Labs reviewed Pt will make her own mammogram appt  Flu shot today  Inc hctz for bp and arrange f/u        Other Visit Diagnoses    Need for immunization against influenza       Relevant Orders   Flu Vaccine QUAD 6+ mos PF IM (Fluarix Quad PF) (Completed)

## 2018-01-27 NOTE — Assessment & Plan Note (Signed)
Lab Results  Component Value Date   HGBA1C 5.8 (H) 01/18/2018   Well controlled Sent for last DM eye exam report-per pt is utd  Nl foot exam  Good diet and wt control  Enc exercise

## 2018-02-08 ENCOUNTER — Ambulatory Visit: Payer: Self-pay | Admitting: *Deleted

## 2018-02-08 VITALS — BP 138/76 | HR 70

## 2018-02-08 DIAGNOSIS — Z79899 Other long term (current) drug therapy: Secondary | ICD-10-CM

## 2018-02-08 DIAGNOSIS — Z8639 Personal history of other endocrine, nutritional and metabolic disease: Secondary | ICD-10-CM

## 2018-02-08 DIAGNOSIS — I10 Essential (primary) hypertension: Secondary | ICD-10-CM

## 2018-02-08 NOTE — Progress Notes (Signed)
BMET drawn per 01/27/18 OV notes. HCTZ increased to 25mg  qd. Hx hypokalemia at this dose. Placed on K+ preemptively.

## 2018-02-09 LAB — BASIC METABOLIC PANEL
BUN/Creatinine Ratio: 23 (ref 12–28)
BUN: 18 mg/dL (ref 8–27)
CO2: 26 mmol/L (ref 20–29)
CREATININE: 0.79 mg/dL (ref 0.57–1.00)
Calcium: 9.7 mg/dL (ref 8.7–10.3)
Chloride: 102 mmol/L (ref 96–106)
GFR calc Af Amer: 88 mL/min/{1.73_m2} (ref >59)
GFR calc non Af Amer: 76 mL/min/{1.73_m2} (ref >59)
Glucose: 106 mg/dL — ABNORMAL HIGH (ref 65–99)
POTASSIUM: 3.8 mmol/L (ref 3.5–5.2)
SODIUM: 144 mmol/L (ref 134–144)

## 2018-02-10 ENCOUNTER — Encounter: Payer: Self-pay | Admitting: Family Medicine

## 2018-02-10 ENCOUNTER — Ambulatory Visit: Payer: No Typology Code available for payment source | Admitting: Family Medicine

## 2018-02-10 VITALS — BP 118/74 | HR 74 | Temp 98.1°F | Ht 63.75 in | Wt 122.5 lb

## 2018-02-10 DIAGNOSIS — I10 Essential (primary) hypertension: Secondary | ICD-10-CM

## 2018-02-10 NOTE — Assessment & Plan Note (Signed)
bp in fair control at this time  BP Readings from Last 1 Encounters:  02/10/18 118/74   No changes needed Most recent labs reviewed - K is well supplemented  Disc lifstyle change with low sodium diet and exercise  Doing well with addn of hctz  (cannot use ace in light of past anaphylaxis with another medication)

## 2018-02-10 NOTE — Progress Notes (Signed)
Subjective:    Patient ID: Laurie Tran, female    DOB: [birth year], 72 y.o.   MRN: [id number]  HPI Here for f/u of HTN  Wt Readings from Last 3 Encounters:  02/10/18 122 lb 8 oz (55.6 kg)  01/27/18 122 lb 4 oz (55.5 kg)  02/06/17 126 lb (57.2 kg)   21.19 kg/m   Last visit increased hctz to 25 mg daily and added k dur 10 meq  (she takes 1/2 of a 20)  Disc lifestyle change  She is urinating more often   bp is improved today No cp or palpitations or headaches or edema  No side effects to medicines  BP Readings from Last 3 Encounters:  02/10/18 118/74  02/08/18 138/76  01/27/18 (!) 148/70     Had labs on 9/16   Lab Results  Component Value Date   CREATININE 0.79 02/08/2018   BUN 18 02/08/2018   NA 144 02/08/2018   K 3.8 02/08/2018   CL 102 02/08/2018   CO2 26 02/08/2018    Aug labs  Lab Results  Component Value Date   CHOL 171 01/18/2018   HDL 58 01/18/2018   LDLCALC 93 01/18/2018   TRIG 101 01/18/2018   CHOLHDL 2.9 01/18/2018   Lab Results  Component Value Date   HGBA1C 5.8 (H) 01/18/2018    Patient Active Problem List   Diagnosis Date Noted  . Encounter for hepatitis C screening test for low risk patient 01/19/2017  . Estrogen deficiency 01/19/2017  . Lichen sclerosus 11/23/1599  . H/O herpes zoster 11/13/2011  . Routine general medical examination at a health care facility 05/30/2011  . Other screening mammogram 05/30/2011  . Colon polyps 05/30/2011  . Type 2 diabetes mellitus with complication, without long-term current use of insulin ([place]) 10/29/2010  . GERD (gastroesophageal reflux disease) 10/29/2010  . HTN (hypertension) 10/29/2010  . Hyperlipidemia 10/29/2010  . History of kidney stones 10/29/2010  . Perennial allergic rhinitis 10/29/2010   Past Medical History:  Diagnosis Date  . Allergy   . Diabetes mellitus 2007   type II  . Dysrhythmia 1998   "Fast heart rate. Used medication to stop it and restart. "  . GERD (gastroesophageal  reflux disease)   . History of chicken pox   . History of kidney stones   . History of UTI   . Hypertension    Past Surgical History:  Procedure Laterality Date  . BREAST SURGERY  1085   breast biopsy benign  . CYSTOSCOPY WITH RETROGRADE PYELOGRAM, URETEROSCOPY AND STENT PLACEMENT Left 10/21/2016   Procedure: CYSTOSCOPY WITH RETROGRADE PYELOGRAM, LEFT URETEROSCOPY PYELOSCOPY  AND LEFT STENT PLACEMENT;  Surgeon: Carolan Clines, MD;  Location: [facility];  Service: Urology;  Laterality: Left;  . j basket retrieval     for kidney stones  . lipotripsy    . MENISCUS REPAIR  2006   rt knee   Social History   Tobacco Use  . Smoking status: Never Smoker  . Smokeless tobacco: Never Used  Substance Use Topics  . Alcohol use: No    Alcohol/week: 0.0 standard drinks  . Drug use: No   Family History  Problem Relation Age of Onset  . Cancer Mother        breast CA  . Arthritis Paternal Grandmother   . Stroke Paternal Grandmother   . Diabetes Paternal Grandmother   . Cancer Sister   . Breast cancer Sister   . Colon cancer Brother    Allergies  Allergen Reactions  . Nsaids Anaphylaxis    Breathing problems   . Bactrim [Sulfamethoxazole-Trimethoprim]     Pt reported tongue swelling with open sores, ShOB, difficulty swallowing  . Celebrex [Celecoxib] Hives    And congestion  . Sulfa Antibiotics     Pt reports tongue swelling, ShOB, difficulty swallowing  . Vioxx [Rofecoxib] Hives    And congestion  . Ibuprofen Other (See Comments)    Reactive airways    Current Outpatient Medications on File Prior to Visit  Medication Sig Dispense Refill  . atorvastatin (LIPITOR) 10 MG tablet Take 1 tablet (10 mg total) by mouth daily. 90 tablet 3  . Cholecalciferol (VITAMIN D-3) 1000 units CAPS Take 2 capsules by mouth daily.    . clobetasol ointment (TEMOVATE) 0.05 % Apply at bedtime to the affected area 60 g 5  . fexofenadine (ALLEGRA) 180 MG tablet TAKE 1 TABLET (180 MG TOTAL) BY MOUTH  DAILY. 90 tablet 3  . hydrochlorothiazide (HYDRODIURIL) 25 MG tablet Take 1 tablet (25 mg total) by mouth daily. 90 tablet 3  . Magnesium 400 MG TABS Take 1 tablet by mouth daily.    . metFORMIN (GLUCOPHAGE) 500 MG tablet Take 1 tablet (500 mg total) by mouth daily with breakfast. 90 tablet 3  . mometasone (NASONEX) 50 MCG/ACT nasal spray Place 2 sprays into the nose daily as needed. 17 g 11  . Multiple Vitamins-Minerals (HAIR/SKIN/NAILS/BIOTIN PO) Take 1 capsule by mouth daily.    . Multiple Vitamins-Minerals (PRESERVISION AREDS 2 PO) Take 1 capsule by mouth daily.    . Nutritional Supplements (PYCNOGENOL PO) Take by mouth.    . Omega-3 Fatty Acids (FISH OIL ULTRA) 1400 MG CAPS Take 1 capsule by mouth daily.    . potassium chloride (K-DUR) 10 MEQ tablet Take 1 tablet (10 mEq total) by mouth daily. 30 tablet 11  . vitamin B-12 (CYANOCOBALAMIN) 1000 MCG tablet Take 1,000 mcg by mouth daily.     No current facility-administered medications on file prior to visit.     Review of Systems  Constitutional: Negative for activity change, appetite change, fatigue, fever and unexpected weight change.  HENT: Negative for congestion, ear pain, rhinorrhea, sinus pressure and sore throat.   Eyes: Negative for pain, redness and visual disturbance.  Respiratory: Negative for cough, shortness of breath and wheezing.   Cardiovascular: Negative for chest pain and palpitations.  Gastrointestinal: Negative for abdominal pain, blood in stool, constipation and diarrhea.  Endocrine: Negative for polydipsia and polyuria.  Genitourinary: Negative for dysuria, frequency and urgency.  Musculoskeletal: Negative for arthralgias, back pain and myalgias.  Skin: Negative for pallor and rash.  Allergic/Immunologic: Negative for environmental allergies.  Neurological: Negative for dizziness, syncope and headaches.  Hematological: Negative for adenopathy. Does not bruise/bleed easily.  Psychiatric/Behavioral: Negative for  decreased concentration and dysphoric mood. The patient is not nervous/anxious.        Objective:   Physical Exam  Constitutional: She appears well-developed and well-nourished. No distress.  Well appearing   HENT:  Head: Normocephalic and atraumatic.  Mouth/Throat: Oropharynx is clear and moist.  Eyes: Pupils are equal, round, and reactive to light. Conjunctivae and EOM are normal.  Neck: Normal range of motion. Neck supple. No JVD present. Carotid bruit is not present. No thyromegaly present.  Cardiovascular: Normal rate, regular rhythm, normal heart sounds and intact distal pulses. Exam reveals no gallop.  Pulmonary/Chest: Effort normal and breath sounds normal. No respiratory distress. She has no wheezes. She has no rales.  No  crackles  Abdominal: Soft. Bowel sounds are normal. She exhibits no distension, no abdominal bruit and no mass. There is no tenderness.  Musculoskeletal: She exhibits no edema.  Lymphadenopathy:    She has no cervical adenopathy.  Neurological: She is alert. She has normal reflexes. She displays normal reflexes. No cranial nerve deficit. Coordination normal.  Skin: Skin is warm and dry. No rash noted.  Psychiatric: She has a normal mood and affect.          Assessment & Plan:   Problem List Items Addressed This Visit      Cardiovascular and Mediastinum   HTN (hypertension) - Primary    bp in fair control at this time  BP Readings from Last 1 Encounters:  02/10/18 118/74   No changes needed Most recent labs reviewed - K is well supplemented  Disc lifstyle change with low sodium diet and exercise  Doing well with addn of hctz  (cannot use ace in light of past anaphylaxis with another medication)

## 2018-02-10 NOTE — Patient Instructions (Signed)
Continue your current medications   Take care of yourself  Blood pressure is better  Labs are fine

## 2018-02-19 ENCOUNTER — Other Ambulatory Visit: Payer: Self-pay | Admitting: Family Medicine

## 2018-03-11 ENCOUNTER — Other Ambulatory Visit: Payer: Self-pay | Admitting: *Deleted

## 2018-03-11 MED ORDER — ATORVASTATIN CALCIUM 10 MG PO TABS: 10.0000 mg | ORAL_TABLET | Freq: Every day | ORAL | 3 refills | Status: DC

## 2018-04-29 ENCOUNTER — Ambulatory Visit: Payer: Self-pay | Admitting: Registered Nurse

## 2018-04-29 VITALS — BP 152/78 | HR 84 | Temp 98.4°F

## 2018-04-29 DIAGNOSIS — M25462 Effusion, left knee: Secondary | ICD-10-CM

## 2018-04-29 DIAGNOSIS — M25562 Pain in left knee: Secondary | ICD-10-CM

## 2018-04-29 MED ORDER — ACETAMINOPHEN 500 MG PO TABS: 1000.0000 mg | ORAL_TABLET | Freq: Four times a day (QID) | ORAL | 0 refills | Status: AC | PRN

## 2018-04-29 NOTE — Progress Notes (Signed)
Subjective:    Patient ID: Laurie Tran, female    DOB: 1946/02/11, 72 y.o.   MRN: [id number]  72y/o caucasian established female patient here for left knee pain feels weak and that it may give out similar to when she had meniscal tear right knee "J" tear.  Symptoms started 1 week ago anterior and posterior knee. Denied trauma/fall/rash/bruising/leash dog getting wrapped around leg/slipping or tripping.  Hasn't noticed swelling.  Pain worsens with stairs/full extension when lying on bed.  Taking tylenol prn.  Rode recumbant bike today without resistance did okay but prolonged walking worsens symptoms.     Review of Systems  Constitutional: Positive for activity change. Negative for appetite change, chills, diaphoresis, fatigue and fever.  HENT: Negative for trouble swallowing and voice change.   Eyes: Negative for photophobia and visual disturbance.  Respiratory: Negative for cough, shortness of breath, wheezing and stridor.   Cardiovascular: Negative for leg swelling.  Gastrointestinal: Negative for abdominal pain, diarrhea, nausea and vomiting.  Endocrine: Negative for cold intolerance and heat intolerance.  Genitourinary: Negative for difficulty urinating.  Musculoskeletal: Positive for gait problem and myalgias. Negative for back pain, joint swelling, neck pain and neck stiffness.  Skin: Negative for color change, pallor, rash and wound.  Allergic/Immunologic: Positive for environmental allergies and immunocompromised state. Negative for food allergies.  Neurological: Negative for dizziness, tremors, seizures, syncope, facial asymmetry, speech difficulty, weakness, light-headedness, numbness and headaches.  Hematological: Negative for adenopathy. Does not bruise/bleed easily.  Psychiatric/Behavioral: Negative for agitation, confusion and sleep disturbance.       Objective:   Physical Exam  Constitutional: She is oriented to person, place, and time. Vital signs are normal. She  appears well-developed and well-nourished. She is active and cooperative.  Non-toxic appearance. She does not have a sickly appearance. She does not appear ill. No distress.  HENT:  Head: Normocephalic and atraumatic.  Right Ear: Hearing and external ear normal.  Left Ear: Hearing and external ear normal.  Nose: Nose normal.  Mouth/Throat: Uvula is midline, oropharynx is clear and moist and mucous membranes are normal. No oropharyngeal exudate.  Eyes: Pupils are equal, round, and reactive to light. Conjunctivae, EOM and lids are normal. Right eye exhibits no discharge. Left eye exhibits no discharge. No scleral icterus.  Neck: Trachea normal, normal range of motion and phonation normal. Neck supple. No neck rigidity. No tracheal deviation, no edema, no erythema and normal range of motion present.  Cardiovascular: Normal rate, regular rhythm, S1 normal, S2 normal, normal heart sounds and intact distal pulses. Exam reveals no gallop, no distant heart sounds and no friction rub.  Pulses:      Radial pulses are 2+ on the right side, and 2+ on the left side.  Pulmonary/Chest: Effort normal and breath sounds normal. No stridor. No respiratory distress. She has no decreased breath sounds. She has no wheezes. She has no rhonchi. She has no rales.  No cough observed in exam room; spoke full sentences without difficulty  Abdominal: Soft. Normal appearance. She exhibits no distension, no fluid wave and no ascites. There is no rigidity and no guarding.  Musculoskeletal: She exhibits edema and tenderness.       Right shoulder: Normal.       Left shoulder: Normal.       Right elbow: Normal.      Left elbow: Normal.       Right hip: Normal.       Left hip: Normal.  Right knee: Normal.       Left knee: She exhibits decreased range of motion, swelling, effusion and abnormal alignment. She exhibits no ecchymosis, no laceration, no erythema, no LCL laxity, normal patellar mobility, no bony tenderness,  normal meniscus and no MCL laxity. Tenderness found. Patellar tendon tenderness noted. No medial joint line and no lateral joint line tenderness noted.       Right ankle: Normal.       Left ankle: Normal.       Cervical back: Normal.       Thoracic back: Normal.       Lumbar back: Normal.       Right hand: Normal.       Left hand: Normal.       Legs: Areas marked most TTP left anterior superior patellar tendon and posterior fossa; nonpitting edema posterior fossa left 1-2+/4 compared to right and TTP left; crepitus with patellar displacement superior left; mcmurray's negative bilaterally; anterior and posterior drawer test positive left approx 0.5cm compared to right no laxity; bilateral MCL/LCL without laxity negative valgus varus stress; observed patient stepping up onto exam table grimacing and halting mid step due to pain with left foot; able to touch foot to gluteus bilaterally; able to fully extend bilaterally knees left with discomfort and right without  Lymphadenopathy:       Head (right side): No submental, no submandibular, no preauricular and no posterior auricular adenopathy present.       Head (left side): No submental, no submandibular, no preauricular and no posterior auricular adenopathy present.    She has no cervical adenopathy.       Right cervical: No superficial cervical adenopathy present.      Left cervical: No superficial cervical adenopathy present.  Neurological: She is alert and oriented to person, place, and time. She has normal strength. She is not disoriented. She displays no atrophy and no tremor. No cranial nerve deficit or sensory deficit. She exhibits normal muscle tone. She displays no seizure activity. Gait abnormal. Coordination normal. GCS eye subscore is 4. GCS verbal subscore is 5. GCS motor subscore is 6.  Abnormal gait on stairs due to pain; flat surface normal heel toe gait; slow to step up onto exam table and straighten left knee to full extension due to  discomfort  Skin: Skin is warm, dry and intact. Capillary refill takes less than 2 seconds. No abrasion, no bruising, no burn, no ecchymosis, no laceration, no petechiae and no rash noted. She is not diaphoretic. No cyanosis or erythema. No pallor. Nails show no clubbing.  Psychiatric: She has a normal mood and affect. Her speech is normal and behavior is normal. Judgment and thought content normal. She is not actively hallucinating. Cognition and memory are normal. She is attentive.  Nursing note and vitals reviewed.   Size S knee sleeve fitted and distributed from clinic stock and assisted to apply in clinic by RN Hildred Alamin and myself.  Patient stated her knee felt less unstable and pain lessened after knee sleeve neoprene applied.      Assessment & Plan:  A-acute left knee pain and swelling, elevated blood pressure  P-recommended no squats/lunges/jumping/high impact sports and only seat weights no standing weightlifting routine until knee pain resolved.  recumbant cycling without resistance and avoid prolonged walking until pain and swelling decrease.   Cryotherapy 15 minutes TID.  Given 1 reusable ice pack/cover from clinic stock for home use and 1 chemical ice pack for use at work today.  Patient was instructed to rest, ice and elevate left leg.  Wear knee sleeve at all times except to shower x 2 weeks.  Consider scheduling appt with orthopedics for imaging/evaluation as could be ACL or meniscal tear +drawer sign but symptoms similar to her right knee meniscal tear.  Tylenol 1000mg  po QID prn pain.  Allergic to NSAIDS  Call or return to clinic as needed if these symptoms worsen or fail to improve as anticipated. Trial home exercise program per [employer].  Discussed neoprene sleeve use and crutches with patient and fitted and distributed from clinic stock. Discussed crutch use with patient she refused them along with knee scooter at this time for use at work from clinic stock.  Discussed  prolonged walking/impact could lengthen recovery time or worsen injury with patient.   Patient verbalized agreement and understanding of treatment plan and had no further questions at this time.  [facility]care handout on knee sprain and rehab exercises given to patient.  Acute knee pain worsening blood pressure.  discussed decrease caffeine intake   Discussed ER if chest pain, worst headache of life, dyspnea or visual changes for re-evaluation.  Will see RN Hildred Alamin next week for repeat BP check.  Patient verbalized understanding information/instructions, agreed with plan of care and had no further questions at this time. P2: ROM exercises, Stretching, and Hand out given

## 2018-04-29 NOTE — Patient Instructions (Signed)
Schedule appt with orthopedics provider of your choice I recommend crutches/knee scooter use and avoid high impact activities/squats/lunges with or without weights  Crutch Use, Adult Crutches are used to take weight off of one of your legs or feet when you stand or walk. You may need crutches to help heal after an injury or procedure. It is important to use crutches that fit properly. When fitted properly:  Each crutch should be 2-3 finger widths below the armpit.  Your weight should be supported by your hand, and not by resting the armpit on the crutch.  It is important that a health care provider has seen you use crutches effectively before you use them at home. What are the risks? Improper use of crutches can injure your shoulders, arms, back, armpits, and hands. To prevent this from happening, make sure your crutches fit properly and do not put pressure on your armpits when using them. While using crutches you also have a higher risk of falling. To prevent falls while using crutches at home or work:  Move furniture or barriers that are in your walkway when possible. Have someone help you with this as needed.  Keep walkways well-lit.  Use a backpack so you do not need to carry items in your hands.  Remove rugs, cords, and other items from the floor that you can trip on.  How to use your crutches How you will use your crutches will depend on the reason you need them. Your health care provider may tell you not to put any weight on the affected leg (non-weight-bearing). Or, your health care provider may allow you to put some, but not all, weight on the affected leg (partial weight-bearing). Follow instructions from your health care provider about weight-bearing. Do not bear weight in an amount that causes pain to the affected area. Walking  1. Stand on your healthy leg and lift both crutches at the same time. 2. Place the crutches one step-length in front of you. 3. Bring your healthy  leg forward to meet, or land slightly ahead of, the crutches. 4. Repeat. Going up steps  If there is no handrail: 1. Step up with the healthy leg. 2. Step up with the crutches and injured leg. 3. Repeat.  If there is a handrail: 1. Hold both crutches in one hand. 2. Place your other hand on the handrail. 3. Place your weight on your arms and step up with your healthy leg. 4. Bring the crutches and the injured leg up to that step. 5. Continue in this way.  If you feel unsteady on steps, you can go up steps on your bottom. To go up, sit on the lowest step with your injured leg in front and holding both crutches flat against the stairs in the other hand. Then use your free hand and your healthy leg for support to scoot your bottom up to the next step. Going down steps  If there is no handrail: 1. Step down with the injured leg and crutches. 2. Step down with the healthy leg. 3. Repeat.  If there is a handrail: 1. Place your hand on the handrail. 2. Hold both crutches with your free hand. 3. Lower your injured leg and crutch to the step below you. Make sure to keep the crutch tips in the center of the step, not on the edge. 4. Lower your healthy leg down to the next step. 5. Repeat.  If you feel unsteady on steps, you can down steps on your  bottom. To go down, sit on the highest step with your injured leg in front and holding both crutches flat against the stairs in the other hand. Then use your free hand and your healthy leg for support to scoot your bottom down to the next step. Standing up  1. Hold the injured leg forward. 2. Grab an armrest with one hand and the top of the crutches with the other hand. 3. Using the armrest and your crutches, pull yourself up to a standing position. Sitting down 1. Hold the injured leg forward. 2. Grab the armrest with one hand and the top of the crutches with the other hand. 3. Slowly lower yourself to a sitting position. Contact a health  care provider if:  You feel unsteady using crutches.  You develop any new pain.  You develop any numbness or tingling.  Your crutches do not fit. Get help right away if:  You fall. This information is not intended to replace advice given to you by your health care provider. Make sure you discuss any questions you have with your health care provider. Document Released: 05/09/2000 Document Revised: 01/10/2016 Document Reviewed: 11/02/2015 Elsevier Interactive Patient Education  2018 [employer].  Anterior Cruciate Ligament Tear Rehab If any of these exercises worsens your knee pain exclude/stop doing them and notify me Ask your health care provider which exercises are safe for you. Do exercises exactly as told by your health care provider and adjust them as directed. It is normal to feel mild stretching, pulling, tightness, or discomfort as you do these exercises, but you should stop right away if you feel sudden pain or your pain gets worse.Do not begin these exercises until told by your health care provider. Stretching and range of motion exercises These exercises warm up your muscles and joints and improve the movement and flexibility of your knee. These exercises also help to relieve pain and stiffness. Exercise A: Knee flexion, active  1. Lie on your back with both knees straight. If this causes back discomfort, bend your healthy knee so your foot is flat on the floor. 2. Slowly slide your left / right heel back toward your buttocks until you feel a gentle stretch in the front of your knee or thigh. 3. Hold for ____10______ seconds. 4. Slowly slide your left / right heel back to the starting position. Repeat _____3_____ times. Complete this exercise ___2_______ times a day. Exercise B: Knee extension, sitting  1. Sit with your left / right heel propped up on a chair, a coffee table, or a footstool. Do not have anything under your knee to support it. 2. Allow your leg muscles to  relax, letting gravity straighten out your knee. You should feel a stretch behind your left / right knee. 3. If told by your health care provider, deepen the stretch by placing a ___none_______ lb weight on your thigh, just above your kneecap. 4. Hold this position for ___10_______ seconds. 5. Rest your left / right foot on the floor and allow your muscles to relax after each repetition. Repeat ______3____ times. Complete this stretch _______2___ times a day. Strengthening exercises These exercises build strength and endurance in your knee. Endurance is the ability to use your muscles for a long time, even after they get tired. Exercise C: Hamstring curls  If told by your health care provider, do this exercise while wearing ankle weights. Begin with __no________ weights. Then increase the weight by 1 lb (0.5 kg) increments. Do not wear ankle weights  that are more than _____zero lbs_____. 1. Lie on your abdomen with your legs straight. You can put a towel above your left / right knee for comfort. 2. Bend your left / right knee up to ____90______ degrees. Keep your hips flat. 3. Hold this position for _10_________ seconds. 4. Slowly lower your leg to the starting position. Repeat __3________ times. Complete this exercise _____2_____ times a day. Exercise D: Straight leg raises ( quadriceps) 1. Lie on your back with your left / right leg extended and your other knee bent. 2. Tense the muscles in the front of your left / right thigh. You should see your kneecap slide up or see increased dimpling just above your knee. 3. Keep these muscles tight as you raise your leg 4-6 inches (10-15 cm) off the floor. 4. Hold for ____10______ seconds. 5. Keep these muscles tense as you lower your leg. 6. Relax your muscles slowly and completely after each repetition. Repeat _____3_____ times. Complete this exercise _____2_____ times a day. Exercise E: Bridge ( hip extensors) 1. Lie on your back on a firm  surface with your knees bent and your feet flat on the floor. 2. Tighten your buttocks muscles and lift your bottom off the floor until your trunk is level with your thighs. ? Do not arch your back. ? You should feel the muscles working in your buttocks and the back of your thighs. If you do not feel a stretch in those muscles, slide your feet 1-2 inches (2.5-5 cm) farther away from your buttocks. 3. Hold this position for ___5_______ seconds. 4. Slowly lower your hips to the starting position. 5. Let your buttocks muscles relax completely between repetitions. 6. If this exercise is too easy, try doing it with your arms crossed over your chest. Repeat _____3_____ times. Complete this exercise _____2_____ times a day. Exercise F: Straight leg raises ( hip abductors) 1. Lie on your side with your left / right leg in the top position. Lie so your head, shoulder, knee, and hip line up. You may bend your bottom knee to help you balance. 2. Lift your top leg 4-6 inches (10-15 cm) while keeping your toes pointed straight ahead. 3. Hold this position for _____10_____ seconds. 4. Slowly lower your leg to the starting position. 5. Let your muscles relax completely after each repetition. Repeat __3________ times. Complete this exercise ___2_______ times a day. This information is not intended to replace advice given to you by your health care provider. Make sure you discuss any questions you have with your health care provider. Document Released: 12/11/2004 Document Revised: 01/17/2016 Document Reviewed: 04/28/2015 Elsevier Interactive Patient Education  2017 [employer] Meniscus Tear A meniscus tear is a knee injury in which a piece of the meniscus is torn. The meniscus is a thick, rubbery, wedge-shaped cartilage in the knee. Two menisci are located in each knee. They sit between the upper bone (femur) and lower bone (tibia) that make up the knee joint. Each meniscus acts as a shock absorber for the  knee. A torn meniscus is one of the most common types of knee injuries. This injury can range from mild to severe. Surgery may be needed for a severe tear. What are the causes? This injury may be caused by any squatting, twisting, or pivoting movement. Sports-related injuries are the most common cause. These often occur from:  Running and stopping suddenly.  Changing direction.  Being tackled or knocked off your feet.  As people get older, their meniscus gets  thinner and weaker. In these people, tears can happen more easily, such as from climbing stairs. What increases the risk? This injury is more likely to happen to:  People who play contact sports.  Males.  People who are 23?70 years of age.  What are the signs or symptoms? Symptoms of this injury include:  Knee pain, especially at the side of the knee joint. You may feel pain when the injury occurs, or you may only hear a pop and feel pain later.  A feeling that your knee is clicking, catching, locking, or giving way.  Not being able to fully bend or extend your knee.  Bruising or swelling in your knee.  How is this diagnosed? This injury may be diagnosed based on your symptoms and a physical exam. The physical exam may include:  Moving your knee in different ways.  Feeling for tenderness.  Listening for a clicking sound.  Checking if your knee locks or catches.  You may also have tests, such as:  X-rays.  MRI.  A procedure to look inside your knee with a narrow surgical telescope (arthroscopy).  You may be referred to a knee specialist (orthopedic surgeon). How is this treated? Treatment for this injury depends on the severity of the tear. Treatment for a mild tear may include:  Rest.  Medicine to reduce pain and swelling. This is usually a nonsteroidal anti-inflammatory drug (NSAID).  A knee brace or an elastic sleeve or wrap.  Using crutches or a walker to keep weight off your knee and to help you  walk.  Exercises to strengthen your knee (physical therapy).  You may need surgery if you have a severe tear or if other treatments are not working. Follow these instructions at home: Managing pain and swelling  Take over-the-counter and prescription medicines only as told by your health care provider.  If directed, apply ice to the injured area: ? Put ice in a plastic bag. ? Place a towel between your skin and the bag. ? Leave the ice on for 20 minutes, 2-3 times per day.  Raise (elevate) the injured area above the level of your heart while you are sitting or lying down. Activity  Do not use the injured limb to support your body weight until your health care provider says that you can. Use crutches or a walker as told by your health care provider.  Return to your normal activities as told by your health care provider. Ask your health care provider what activities are safe for you.  Perform range-of-motion exercises only as told by your health care provider.  Begin doing exercises to strengthen your knee and leg muscles only as told by your health care provider. After you recover, your health care provider may recommend these exercises to help prevent another injury. General instructions  Use a knee brace or elastic wrap as told by your health care provider.  Keep all follow-up visits as told by your health care provider. This is important. Contact a health care provider if:  You have a fever.  Your knee becomes red, tender, or swollen.  Your pain medicine is not helping.  Your symptoms get worse or do not improve after 2 weeks of home care. This information is not intended to replace advice given to you by your health care provider. Make sure you discuss any questions you have with your health care provider. Document Released: 08/02/2002 Document Revised: 10/18/2015 Document Reviewed: 09/04/2014 Elsevier Interactive Patient Education  [employer]. Anterior  Cruciate  Ligament Tear Ligaments are strong bands of tissue that connect bones to each other. The anterior cruciate ligament (ACL) connects your shin bone to your thigh bone. A tear in this ligament can cause pain and make it hard for you to put weight on your leg (use your leg to support your body weight). There are three types of ACL injuries:  Grade 1. In this type, the ligament is stretched.  Grade 2. In this type, the ligament is partially torn.  Grade 3. In this type, the ligament is completely torn.  What are the causes? This condition happens when too much pressure is put on the ACL. It may happen if:  You twist your knee, especially with your foot planted.  You make a quick change in direction (cut and pivot).  You slow down quickly while running.  You land a jump without bending your knee.  You forcefully straighten your knee more than normal (hyperextend your knee).  You are hit in the knee.  You hit your knee on the ground.  What increases the risk? This condition is more likely to develop in:  Women.  People who play sports that involve jumping or changing directions often. These include: ? Football. ? Basketball. ? Volleyball. ? Soccer. ? Skiing. ? Hockey. ? Gymnastics  What are the signs or symptoms? Symptoms of this condition include:  A popping sound at the time of injury.  A feeling that your knee is bending abnormally at the time of injury.  Pain.  Swelling.  Tenderness.  Instability when you try to put weight on your injured leg.  Inability to move your knee as far as normal.  Difficulty walking.  How is this diagnosed? This condition may be diagnosed based on:  Your symptoms.  Your medical history.  A physical exam.  Tests, such as: ? An X-ray. This may be done to check for bone injuries. ? MRI. This may be done to see if the tear is partial or complete and to check for additional injuries.  During your physical exam, your provider  will test your knee to see if it moves more than it should (laxity). How is this treated? This condition may be treated with:  Resting your knee.  Avoiding activities that cause pain, instability, or swelling.  Raising (elevating) your knee while resting.  Keeping weight off your leg until pain and swelling improve. You may need to use crutches or a walker.  Icing your knee.  Taking medicine to reduce pain and swelling.  Wearing a knee brace.  Doing range-of-motion, strengthening, and stretching exercises (physical therapy).  Surgery. This may be needed if you are very active and have a complete tear.  Follow these instructions at home: If you have a brace:  Wear it as told by your health care provider. Remove it only as told by your health care provider.  Loosen the brace if your toes tingle, become numb, or turn cold and blue.  Do not let your brace get wet if it is not waterproof.  Keep the brace clean. Managing pain, stiffness, and swelling  If directed, put ice on your knee: ? Put ice in a plastic bag. ? Place a towel between your skin and the bag. ? Leave the ice on for 20 minutes, 2-3 times a day.  Move your foot often to avoid stiffness and to lessen swelling.  Elevate your knee above the level of your heart while you are sitting or lying down. Driving  Do not drive or operate heavy machinery while taking prescription pain medicine.  Ask your health care provider when it is safe to drive if you have a brace on your leg. Activity  Return to your normal activities as told by your health care provider. Ask your health care provider what activities are safe for you.  Do exercises as told by your health care provider. General instructions  Do not use the injured limb to support your body weight until your health care provider says that you can. Use crutches or a walker as told by your health care provider.  Do not use any tobacco products, such as  cigarettes, chewing tobacco, and e-cigarettes. Tobacco can delay healing. If you need help quitting, ask your health care provider.  Take over-the-counter and prescription medicines only as told by your health care provider.  Keep all follow-up visits as told by your health care provider. This is important. How is this prevented?  Warm up and stretch before being active.  Cool down and stretch after being active.  Give your body time to rest between periods of activity.  Make sure to use equipment that fits you.  If you wear cleats, make sure they are appropriate for your playing surface.  Be safe and responsible while being active to avoid falls.  Maintain physical fitness, including: ? Strength. ? Flexibility. Contact a health care provider if:  Your symptoms are not improving.  Your symptoms are getting worse. This information is not intended to replace advice given to you by your health care provider. Make sure you discuss any questions you have with your health care provider. Document Released: 12/11/2004 Document Revised: 01/15/2016 Document Reviewed: 04/28/2015 Elsevier Interactive Patient Education  2017 [employer].

## 2018-05-27 ENCOUNTER — Other Ambulatory Visit: Payer: Self-pay | Admitting: *Deleted

## 2018-05-27 MED ORDER — POTASSIUM CHLORIDE ER 10 MEQ PO TBCR: 10.0000 meq | EXTENDED_RELEASE_TABLET | Freq: Every day | ORAL | 5 refills | Status: DC

## 2018-07-19 ENCOUNTER — Ambulatory Visit: Payer: Self-pay | Admitting: *Deleted

## 2018-07-19 VITALS — BP 156/91 | Ht 65.0 in | Wt 119.0 lb

## 2018-07-19 DIAGNOSIS — Z0000 Encounter for general adult medical examination without abnormal findings: Secondary | ICD-10-CM

## 2018-07-19 NOTE — Progress Notes (Signed)
[employer] insurance premium discount evaluation: Labs Drawn. Replacements ROI form signed. Tobacco Free Attestation form signed.  Forms placed in paper chart. Okay to route results to pcp [facility].

## 2018-07-20 LAB — CMP12+LP+TP+TSH+6AC+CBC/D/PLT
ALK PHOS: 118 IU/L — AB (ref 39–117)
ALT: 14 IU/L (ref 0–32)
AST: 19 IU/L (ref 0–40)
Albumin/Globulin Ratio: 1.9 (ref 1.2–2.2)
Albumin: 4.4 g/dL (ref 3.8–4.8)
BASOS: 1 %
BUN/Creatinine Ratio: 23 (ref 12–28)
BUN: 19 mg/dL (ref 8–27)
Basophils Absolute: 0 10*3/uL (ref 0.0–0.2)
Bilirubin Total: 0.5 mg/dL (ref 0.0–1.2)
CHLORIDE: 100 mmol/L (ref 96–106)
CHOL/HDL RATIO: 2.8 ratio (ref 0.0–4.4)
CREATININE: 0.82 mg/dL (ref 0.57–1.00)
Calcium: 9.6 mg/dL (ref 8.7–10.3)
Cholesterol, Total: 154 mg/dL (ref 100–199)
EOS (ABSOLUTE): 0.2 10*3/uL (ref 0.0–0.4)
Eos: 3 %
Free Thyroxine Index: 1.9 (ref 1.2–4.9)
GFR calc Af Amer: 84 mL/min/{1.73_m2} (ref >59)
GFR, EST NON AFRICAN AMERICAN: 73 mL/min/{1.73_m2} (ref >59)
GGT: 5 IU/L (ref 0–60)
Globulin, Total: 2.3 g/dL (ref 1.5–4.5)
Glucose: 88 mg/dL (ref 65–99)
HDL: 55 mg/dL (ref >39)
HEMATOCRIT: 37.3 % (ref 34.0–46.6)
Hemoglobin: 12.7 g/dL (ref 11.1–15.9)
IMMATURE GRANS (ABS): 0 10*3/uL (ref 0.0–0.1)
Immature Granulocytes: 0 %
Iron: 80 ug/dL (ref 27–139)
LDH: 189 IU/L (ref 119–226)
LDL CALC: 81 mg/dL (ref 0–99)
Lymphocytes Absolute: 2.2 10*3/uL (ref 0.7–3.1)
Lymphs: 28 %
MCH: 29.2 pg (ref 26.6–33.0)
MCHC: 34 g/dL (ref 31.5–35.7)
MCV: 86 fL (ref 79–97)
MONOS ABS: 0.5 10*3/uL (ref 0.1–0.9)
Monocytes: 6 %
NEUTROS ABS: 4.8 10*3/uL (ref 1.4–7.0)
Neutrophils: 62 %
PHOSPHORUS: 3.2 mg/dL (ref 3.0–4.3)
POTASSIUM: 3.7 mmol/L (ref 3.5–5.2)
Platelets: 266 10*3/uL (ref 150–450)
RBC: 4.35 x10E6/uL (ref 3.77–5.28)
RDW: 12.5 % (ref 11.7–15.4)
SODIUM: 142 mmol/L (ref 134–144)
T3 Uptake Ratio: 22 % — ABNORMAL LOW (ref 24–39)
T4 TOTAL: 8.8 ug/dL (ref 4.5–12.0)
TSH: 2.31 u[IU]/mL (ref 0.450–4.500)
Total Protein: 6.7 g/dL (ref 6.0–8.5)
Triglycerides: 92 mg/dL (ref 0–149)
Uric Acid: 4.4 mg/dL (ref 2.5–7.1)
VLDL Cholesterol Cal: 18 mg/dL (ref 5–40)
WBC: 7.7 10*3/uL (ref 3.4–10.8)

## 2018-07-20 LAB — HGB A1C W/O EAG: HEMOGLOBIN A1C: 5.8 % — AB (ref 4.8–5.6)

## 2018-07-20 LAB — VITAMIN D 25 HYDROXY (VIT D DEFICIENCY, FRACTURES): Vit D, 25-Hydroxy: 55.7 ng/mL (ref 30.0–100.0)

## 2018-07-27 NOTE — Progress Notes (Signed)
Noted. Pt was not advised of wt loss during result review with RN. Did review other recommendations regarding exercise and added sugars in diet.

## 2018-07-27 NOTE — Progress Notes (Signed)
noted 

## 2018-08-01 ENCOUNTER — Other Ambulatory Visit: Payer: Self-pay | Admitting: Family Medicine

## 2018-08-08 ENCOUNTER — Other Ambulatory Visit: Payer: Self-pay | Admitting: Family Medicine

## 2018-08-09 NOTE — Telephone Encounter (Signed)
Please clarify with pt what dose she is taking and let me know

## 2018-08-09 NOTE — Telephone Encounter (Signed)
Last Rx filled was for 25mg  tabs this refill request is for 12.5mg , please advise

## 2018-08-10 MED ORDER — HYDROCHLOROTHIAZIDE 25 MG PO TABS: 25.0000 mg | ORAL_TABLET | Freq: Every day | ORAL | 1 refills | Status: DC

## 2018-08-10 NOTE — Telephone Encounter (Signed)
Left VM requesting pt to call the office back 

## 2018-08-10 NOTE — Telephone Encounter (Signed)
Pt returned call and is currently taking 25mg . [facility]

## 2018-08-10 NOTE — Telephone Encounter (Signed)
Rx sent 

## 2018-11-25 ENCOUNTER — Other Ambulatory Visit: Payer: Self-pay

## 2018-11-25 ENCOUNTER — Other Ambulatory Visit: Payer: Self-pay | Admitting: Urology

## 2018-11-25 ENCOUNTER — Encounter (HOSPITAL_COMMUNITY): Payer: Self-pay | Admitting: *Deleted

## 2018-11-25 ENCOUNTER — Ambulatory Visit (HOSPITAL_COMMUNITY): Payer: No Typology Code available for payment source

## 2018-11-25 ENCOUNTER — Observation Stay (HOSPITAL_COMMUNITY)
Admission: RE | Admit: 2018-11-25 | Discharge: 2018-11-26 | Disposition: A | Discharge status: Home / Self Care | Payer: No Typology Code available for payment source | Source: Other Acute Inpatient Hospital | Attending: Urology | Admitting: Urology

## 2018-11-25 ENCOUNTER — Ambulatory Visit (HOSPITAL_COMMUNITY): Payer: No Typology Code available for payment source | Admitting: Certified Registered"

## 2018-11-25 ENCOUNTER — Other Ambulatory Visit (HOSPITAL_COMMUNITY)
Admission: RE | Admit: 2018-11-25 | Discharge: 2018-11-25 | Disposition: A | Discharge status: Home / Self Care | Payer: No Typology Code available for payment source | Source: Ambulatory Visit | Attending: Urology | Admitting: Urology

## 2018-11-25 ENCOUNTER — Encounter (HOSPITAL_COMMUNITY)
Admission: RE | Disposition: A | Discharge status: Home / Self Care | Payer: Self-pay | Source: Other Acute Inpatient Hospital | Attending: Urology

## 2018-11-25 DIAGNOSIS — Z886 Allergy status to analgesic agent status: Secondary | ICD-10-CM | POA: Insufficient documentation

## 2018-11-25 DIAGNOSIS — Z881 Allergy status to other antibiotic agents status: Secondary | ICD-10-CM | POA: Diagnosis not present

## 2018-11-25 DIAGNOSIS — E119 Type 2 diabetes mellitus without complications: Secondary | ICD-10-CM | POA: Diagnosis not present

## 2018-11-25 DIAGNOSIS — I10 Essential (primary) hypertension: Secondary | ICD-10-CM | POA: Insufficient documentation

## 2018-11-25 DIAGNOSIS — Z882 Allergy status to sulfonamides status: Secondary | ICD-10-CM | POA: Diagnosis not present

## 2018-11-25 DIAGNOSIS — N136 Pyonephrosis: Principal | ICD-10-CM | POA: Insufficient documentation

## 2018-11-25 DIAGNOSIS — N201 Calculus of ureter: Secondary | ICD-10-CM | POA: Diagnosis present

## 2018-11-25 DIAGNOSIS — N390 Urinary tract infection, site not specified: Secondary | ICD-10-CM

## 2018-11-25 DIAGNOSIS — Z1159 Encounter for screening for other viral diseases: Secondary | ICD-10-CM | POA: Diagnosis not present

## 2018-11-25 HISTORY — PX: CYSTOSCOPY W/ URETERAL STENT PLACEMENT: SHX1429

## 2018-11-25 LAB — CBC
HCT: 36.1 % (ref 36.0–46.0)
HCT: 33.5 % — ABNORMAL LOW (ref 36.0–46.0)
Hemoglobin: 12.1 g/dL (ref 12.0–15.0)
Hemoglobin: 11.3 g/dL — ABNORMAL LOW (ref 12.0–15.0)
MCH: 29.2 pg (ref 26.0–34.0)
MCH: 29.7 pg (ref 26.0–34.0)
MCHC: 33.5 g/dL (ref 30.0–36.0)
MCHC: 33.7 g/dL (ref 30.0–36.0)
MCV: 87.2 fL (ref 80.0–100.0)
MCV: 88.2 fL (ref 80.0–100.0)
Platelets: 241 10*3/uL (ref 150–400)
Platelets: 197 10*3/uL (ref 150–400)
RBC: 4.14 MIL/uL (ref 3.87–5.11)
RBC: 3.8 MIL/uL — ABNORMAL LOW (ref 3.87–5.11)
RDW: 12.7 % (ref 11.5–15.5)
RDW: 12.7 % (ref 11.5–15.5)
WBC: 14.6 10*3/uL — ABNORMAL HIGH (ref 4.0–10.5)
WBC: 11.1 10*3/uL — ABNORMAL HIGH (ref 4.0–10.5)
nRBC: 0 % (ref 0.0–0.2)
nRBC: 0 % (ref 0.0–0.2)

## 2018-11-25 LAB — BASIC METABOLIC PANEL
Anion gap: 10 (ref 5–15)
Anion gap: 7 (ref 5–15)
BUN: 23 mg/dL (ref 8–23)
BUN: 20 mg/dL (ref 8–23)
CO2: 28 mmol/L (ref 22–32)
CO2: 29 mmol/L (ref 22–32)
Calcium: 9.2 mg/dL (ref 8.9–10.3)
Calcium: 8.8 mg/dL — ABNORMAL LOW (ref 8.9–10.3)
Chloride: 101 mmol/L (ref 98–111)
Chloride: 102 mmol/L (ref 98–111)
Creatinine, Ser: 0.8 mg/dL (ref 0.44–1.00)
Creatinine, Ser: 0.86 mg/dL (ref 0.44–1.00)
GFR calc Af Amer: >60 mL/min (ref >60)
GFR calc Af Amer: >60 mL/min (ref >60)
GFR calc non Af Amer: >60 mL/min (ref >60)
GFR calc non Af Amer: >60 mL/min (ref >60)
Glucose, Bld: 101 mg/dL — ABNORMAL HIGH (ref 70–99)
Glucose, Bld: 125 mg/dL — ABNORMAL HIGH (ref 70–99)
Potassium: 3.3 mmol/L — ABNORMAL LOW (ref 3.5–5.1)
Potassium: 3.4 mmol/L — ABNORMAL LOW (ref 3.5–5.1)
Sodium: 139 mmol/L (ref 135–145)
Sodium: 138 mmol/L (ref 135–145)

## 2018-11-25 LAB — SARS CORONAVIRUS 2 BY RT PCR (HOSPITAL ORDER, PERFORMED IN ~~LOC~~ HOSPITAL LAB): SARS Coronavirus 2: NEGATIVE

## 2018-11-25 LAB — HM DIABETES EYE EXAM

## 2018-11-25 LAB — GLUCOSE, CAPILLARY
Glucose-Capillary: 89 mg/dL (ref 70–99)
Glucose-Capillary: 110 mg/dL — ABNORMAL HIGH (ref 70–99)

## 2018-11-25 SURGERY — CYSTOSCOPY, WITH RETROGRADE PYELOGRAM AND URETERAL STENT INSERTION
Anesthesia: General | Laterality: Right

## 2018-11-25 MED ORDER — HYDROMORPHONE HCL 1 MG/ML IJ SOLN: 0.5000 mg | INTRAMUSCULAR | Status: DC | PRN

## 2018-11-25 MED ORDER — ONDANSETRON HCL 4 MG/2ML IJ SOLN
INTRAMUSCULAR | Status: DC | PRN
  Administered 2018-11-25: 4 mg via INTRAVENOUS

## 2018-11-25 MED ORDER — OXYCODONE HCL 5 MG PO TABS
ORAL_TABLET | ORAL | Status: AC
  Administered 2018-11-25: 19:00:00
  Filled 2018-11-25: qty 1

## 2018-11-25 MED ORDER — FENTANYL CITRATE (PF) 100 MCG/2ML IJ SOLN
25.0000 ug | INTRAMUSCULAR | Status: DC | PRN
  Administered 2018-11-25: 25 ug via INTRAVENOUS

## 2018-11-25 MED ORDER — DEXAMETHASONE SODIUM PHOSPHATE 10 MG/ML IJ SOLN
INTRAMUSCULAR | Status: DC | PRN
  Administered 2018-11-25: 10 mg via INTRAVENOUS

## 2018-11-25 MED ORDER — MIDAZOLAM HCL 5 MG/5ML IJ SOLN
INTRAMUSCULAR | Status: DC | PRN
  Administered 2018-11-25: 2 mg via INTRAVENOUS

## 2018-11-25 MED ORDER — SODIUM CHLORIDE 0.9 % IV SOLN
INTRAVENOUS | Status: DC
  Administered 2018-11-25: 22:00:00 via INTRAVENOUS

## 2018-11-25 MED ORDER — HYOSCYAMINE SULFATE 0.125 MG SL SUBL
0.1250 mg | SUBLINGUAL_TABLET | SUBLINGUAL | Status: DC | PRN
  Filled 2018-11-25: qty 1

## 2018-11-25 MED ORDER — IOHEXOL 300 MG/ML  SOLN
INTRAMUSCULAR | Status: DC | PRN
  Administered 2018-11-25: 5 mL via URETHRAL

## 2018-11-25 MED ORDER — FENTANYL CITRATE (PF) 100 MCG/2ML IJ SOLN
INTRAMUSCULAR | Status: DC | PRN
  Administered 2018-11-25: 25 ug via INTRAVENOUS

## 2018-11-25 MED ORDER — SODIUM CHLORIDE 0.9 % IV SOLN
1.0000 g | INTRAVENOUS | Status: DC
  Administered 2018-11-25: 1 g via INTRAVENOUS
  Filled 2018-11-25: qty 1
  Filled 2018-11-25: qty 10

## 2018-11-25 MED ORDER — ZOLPIDEM TARTRATE 5 MG PO TABS: 5.0000 mg | ORAL_TABLET | Freq: Every evening | ORAL | Status: DC | PRN

## 2018-11-25 MED ORDER — ONDANSETRON HCL 4 MG/2ML IJ SOLN: 4.0000 mg | INTRAMUSCULAR | Status: DC | PRN

## 2018-11-25 MED ORDER — SODIUM CHLORIDE 0.9 % IR SOLN
Status: DC | PRN
  Administered 2018-11-25: 3000 mL via INTRAVESICAL

## 2018-11-25 MED ORDER — DEXAMETHASONE SODIUM PHOSPHATE 10 MG/ML IJ SOLN
INTRAMUSCULAR | Status: AC
  Filled 2018-11-25: qty 1

## 2018-11-25 MED ORDER — MIDAZOLAM HCL 2 MG/2ML IJ SOLN
INTRAMUSCULAR | Status: AC
  Filled 2018-11-25: qty 2

## 2018-11-25 MED ORDER — ONDANSETRON HCL 4 MG/2ML IJ SOLN: 4.0000 mg | Freq: Four times a day (QID) | INTRAMUSCULAR | Status: DC | PRN

## 2018-11-25 MED ORDER — PROPOFOL 10 MG/ML IV BOLUS
INTRAVENOUS | Status: DC | PRN
  Administered 2018-11-25: 140 mg via INTRAVENOUS

## 2018-11-25 MED ORDER — CEFAZOLIN SODIUM-DEXTROSE 2-4 GM/100ML-% IV SOLN
2.0000 g | INTRAVENOUS | Status: AC
  Administered 2018-11-25: 2 g via INTRAVENOUS
  Filled 2018-11-25: qty 100

## 2018-11-25 MED ORDER — ONDANSETRON HCL 4 MG/2ML IJ SOLN
INTRAMUSCULAR | Status: AC
  Filled 2018-11-25: qty 2

## 2018-11-25 MED ORDER — LIDOCAINE 2% (20 MG/ML) 5 ML SYRINGE
INTRAMUSCULAR | Status: DC | PRN
  Administered 2018-11-25: 50 mg via INTRAVENOUS

## 2018-11-25 MED ORDER — OXYCODONE HCL 5 MG PO TABS
5.0000 mg | ORAL_TABLET | Freq: Once | ORAL | Status: AC | PRN
  Administered 2018-11-25: 5 mg via ORAL

## 2018-11-25 MED ORDER — PROPOFOL 10 MG/ML IV BOLUS
INTRAVENOUS | Status: AC
  Filled 2018-11-25: qty 20

## 2018-11-25 MED ORDER — HYDROCHLOROTHIAZIDE 25 MG PO TABS
25.0000 mg | ORAL_TABLET | Freq: Every day | ORAL | Status: DC
  Administered 2018-11-26: 25 mg via ORAL
  Filled 2018-11-25: qty 1

## 2018-11-25 MED ORDER — OXYCODONE HCL 5 MG/5ML PO SOLN: 5.0000 mg | Freq: Once | ORAL | Status: AC | PRN

## 2018-11-25 MED ORDER — OXYCODONE-ACETAMINOPHEN 5-325 MG PO TABS: 1.0000 | ORAL_TABLET | ORAL | Status: DC | PRN

## 2018-11-25 MED ORDER — ATORVASTATIN CALCIUM 10 MG PO TABS
10.0000 mg | ORAL_TABLET | Freq: Every day | ORAL | Status: DC
  Filled 2018-11-25: qty 1

## 2018-11-25 MED ORDER — DIPHENHYDRAMINE HCL 50 MG/ML IJ SOLN: 12.5000 mg | Freq: Four times a day (QID) | INTRAMUSCULAR | Status: DC | PRN

## 2018-11-25 MED ORDER — FENTANYL CITRATE (PF) 100 MCG/2ML IJ SOLN
INTRAMUSCULAR | Status: AC
  Filled 2018-11-25: qty 2

## 2018-11-25 MED ORDER — DIPHENHYDRAMINE HCL 12.5 MG/5ML PO ELIX: 12.5000 mg | ORAL_SOLUTION | Freq: Four times a day (QID) | ORAL | Status: DC | PRN

## 2018-11-25 MED ORDER — FENTANYL CITRATE (PF) 100 MCG/2ML IJ SOLN
INTRAMUSCULAR | Status: AC
  Administered 2018-11-25: 19:00:00
  Filled 2018-11-25: qty 2

## 2018-11-25 MED ORDER — ACETAMINOPHEN 325 MG PO TABS: 650.0000 mg | ORAL_TABLET | ORAL | Status: DC | PRN

## 2018-11-25 MED ORDER — INSULIN ASPART 100 UNIT/ML ~~LOC~~ SOLN
0.0000 [IU] | Freq: Three times a day (TID) | SUBCUTANEOUS | Status: DC
  Administered 2018-11-26: 3 [IU] via SUBCUTANEOUS

## 2018-11-25 MED ORDER — LACTATED RINGERS IV SOLN
INTRAVENOUS | Status: DC
  Administered 2018-11-25: 17:00:00 via INTRAVENOUS

## 2018-11-25 SURGICAL SUPPLY — 23 items
BAG URO CATCHER STRL LF (MISCELLANEOUS) ×2 IMPLANT
CATH FOLEY 2WAY SLVR  5CC 16FR (CATHETERS) ×1
CATH FOLEY 2WAY SLVR 5CC 16FR (CATHETERS) IMPLANT
CATH INTERMIT  6FR 70CM (CATHETERS) ×2 IMPLANT
CLOTH BEACON ORANGE TIMEOUT ST (SAFETY) ×2 IMPLANT
COVER WAND RF STERILE (DRAPES) IMPLANT
EXTRACTOR STONE NITINOL NGAGE (UROLOGICAL SUPPLIES) IMPLANT
FIBER LASER TRAC TIP (UROLOGICAL SUPPLIES) IMPLANT
GLOVE BIO SURGEON STRL SZ8 (GLOVE) ×6 IMPLANT
GOWN STRL REUS W/TWL XL LVL3 (GOWN DISPOSABLE) ×3 IMPLANT
GUIDEWIRE ANG ZIPWIRE 038X150 (WIRE) ×2 IMPLANT
GUIDEWIRE STR DUAL SENSOR (WIRE) ×1 IMPLANT
IV NS 1000ML (IV SOLUTION) ×1
IV NS 1000ML BAXH (IV SOLUTION) ×1 IMPLANT
KIT TURNOVER KIT A (KITS) IMPLANT
MANIFOLD NEPTUNE II (INSTRUMENTS) ×2 IMPLANT
PACK CYSTO (CUSTOM PROCEDURE TRAY) ×2 IMPLANT
SHEATH URETERAL 12FRX35CM (MISCELLANEOUS) IMPLANT
STENT CONTOUR 6FRX24X.038 (STENTS) ×1 IMPLANT
STENT URET 6FRX26 CONTOUR (STENTS) IMPLANT
TUBE FEEDING 8FR 16IN STR KANG (MISCELLANEOUS) IMPLANT
TUBING CONNECTING 10 (TUBING) ×2 IMPLANT
TUBING UROLOGY SET (TUBING) ×1 IMPLANT

## 2018-11-25 NOTE — H&P (Signed)
Urology Admission H&P  Chief Complaint: right flank pain  History of Present Illness: Laurie Tran is a 73yo with a hx of nephrolithiasis who presented to my office today with a 1 week history of right flank pain. She was found to have a 1cm right ureteral calculus on CT. UA in my office was concerning for infection. No fevers/chills/sweats. No LUTS. Her pain is sharp, intermittent, mild and nonraditing. No other associated sign/symptoms. No exacerbating/alleviaitng events.  Past Medical History:  Diagnosis Date  . Allergy   . Diabetes mellitus 2007   type II  . Dysrhythmia 1998   "Fast heart rate. Used medication to stop it and restart. "  . GERD (gastroesophageal reflux disease)   . History of chicken pox   . History of kidney stones   . History of UTI   . Hypertension    Past Surgical History:  Procedure Laterality Date  . BREAST SURGERY  1085   breast biopsy benign  . CYSTOSCOPY WITH RETROGRADE PYELOGRAM, URETEROSCOPY AND STENT PLACEMENT Left 10/21/2016   Procedure: CYSTOSCOPY WITH RETROGRADE PYELOGRAM, LEFT URETEROSCOPY PYELOSCOPY  AND LEFT STENT PLACEMENT;  Surgeon: Carolan Clines, MD;  Location: [facility];  Service: Urology;  Laterality: Left;  . j basket retrieval     for kidney stones  . lipotripsy    . MENISCUS REPAIR  2006   rt knee    Home Medications:  Current Facility-Administered Medications  Medication Dose Route Frequency Provider Last Rate Last Dose  . ceFAZolin (ANCEF) IVPB 2g/100 mL premix  2 g Intravenous 30 min Pre-Op Cleon Gustin, MD      . lactated ringers infusion   Intravenous Continuous Albertha Ghee, MD 75 mL/hr at 11/25/18 1639     Allergies:  Allergies  Allergen Reactions  . Nsaids Anaphylaxis    Breathing problems   . Bactrim [Sulfamethoxazole-Trimethoprim]     Pt reported tongue swelling with open sores, ShOB, difficulty swallowing  . Celebrex [Celecoxib] Hives    And congestion  . Sulfa Antibiotics     Pt reports tongue swelling,  ShOB, difficulty swallowing  . Vioxx [Rofecoxib] Hives    And congestion  . Ibuprofen Other (See Comments)    Reactive airways     Family History  Problem Relation Age of Onset  . Cancer Mother        breast CA  . Arthritis Paternal Grandmother   . Stroke Paternal Grandmother   . Diabetes Paternal Grandmother   . Cancer Sister   . Breast cancer Sister   . Colon cancer Brother    Social History:  reports that she has never smoked. She has never used smokeless tobacco. She reports that she does not drink alcohol or use drugs.  Review of Systems  Genitourinary: Positive for flank pain.  All other systems reviewed and are negative.   Physical Exam:  Vital signs in last 24 hours: Temp:  [98 F (36.7 C)] 98 F (36.7 C) (07/02 1549) Pulse Rate:  [75] 75 (07/02 1549) Resp:  [16] 16 (07/02 1549) BP: (148)/(73) 148/73 (07/02 1549) SpO2:  [98 %] 98 % (07/02 1549) Weight:  [53.3 kg] 53.3 kg (07/02 1549) Physical Exam  Constitutional: She is oriented to person, place, and time. She appears well-developed and well-nourished.  HENT:  Head: Normocephalic and atraumatic.  Eyes: Pupils are equal, round, and reactive to light. EOM are normal.  Neck: Normal range of motion. No thyromegaly present.  Cardiovascular: Normal rate and regular rhythm.  Respiratory: Effort normal. No  respiratory distress.  GI: Soft. She exhibits no distension.  Musculoskeletal: Normal range of motion.        General: No edema.  Neurological: She is alert and oriented to person, place, and time.  Skin: Skin is warm and dry.  Psychiatric: She has a normal mood and affect. Her behavior is normal. Judgment and thought content normal.    Laboratory Data:  Results for orders placed or performed during the hospital encounter of 11/25/18 (from the past 24 hour(s))  Glucose, capillary     Status: None   Collection Time: 11/25/18  4:29 PM  Result Value Ref Range   Glucose-Capillary 89 70 - 99 mg/dL  Basic  metabolic panel     Status: Abnormal   Collection Time: 11/25/18  4:34 PM  Result Value Ref Range   Sodium 139 135 - 145 mmol/L   Potassium 3.3 (L) 3.5 - 5.1 mmol/L   Chloride 101 98 - 111 mmol/L   CO2 28 22 - 32 mmol/L   Glucose, Bld 101 (H) 70 - 99 mg/dL   BUN 23 8 - 23 mg/dL   Creatinine, Ser 0.80 0.44 - 1.00 mg/dL   Calcium 9.2 8.9 - 10.3 mg/dL   GFR calc non Af Amer >60 >60 mL/min   GFR calc Af Amer >60 >60 mL/min   Anion gap 10 5 - 15  CBC     Status: Abnormal   Collection Time: 11/25/18  4:34 PM  Result Value Ref Range   WBC 14.6 (H) 4.0 - 10.5 K/uL   RBC 4.14 3.87 - 5.11 MIL/uL   Hemoglobin 12.1 12.0 - 15.0 g/dL   HCT 36.1 36.0 - 46.0 %   MCV 87.2 80.0 - 100.0 fL   MCH 29.2 26.0 - 34.0 pg   MCHC 33.5 30.0 - 36.0 g/dL   RDW 12.7 11.5 - 15.5 %   Platelets 241 150 - 400 K/uL   nRBC 0.0 0.0 - 0.2 %   Recent Results (from the past 240 hour(s))  SARS Coronavirus 2 (CEPHEID - Performed in [place] hospital lab), Hosp Order     Status: None   Collection Time: 11/25/18  1:30 PM   Specimen: Nasopharyngeal Swab  Result Value Ref Range Status   SARS Coronavirus 2 NEGATIVE NEGATIVE Final    Comment: (NOTE) If result is NEGATIVE SARS-CoV-2 target nucleic acids are NOT DETECTED. The SARS-CoV-2 RNA is generally detectable in upper and lower  respiratory specimens during the acute phase of infection. The lowest  concentration of SARS-CoV-2 viral copies this assay can detect is 250  copies / mL. A negative result does not preclude SARS-CoV-2 infection  and should not be used as the sole basis for treatment or other  patient management decisions.  A negative result may occur with  improper specimen collection / handling, submission of specimen other  than nasopharyngeal swab, presence of viral mutation(s) within the  areas targeted by this assay, and inadequate number of viral copies  (<250 copies / mL). A negative result must be combined with clinical  observations, patient  history, and epidemiological information. If result is POSITIVE SARS-CoV-2 target nucleic acids are DETECTED. The SARS-CoV-2 RNA is generally detectable in upper and lower  respiratory specimens dur ing the acute phase of infection.  Positive  results are indicative of active infection with SARS-CoV-2.  Clinical  correlation with patient history and other diagnostic information is  necessary to determine patient infection status.  Positive results do  not rule out bacterial  infection or co-infection with other viruses. If result is PRESUMPTIVE POSTIVE SARS-CoV-2 nucleic acids MAY BE PRESENT.   A presumptive positive result was obtained on the submitted specimen  and confirmed on repeat testing.  While 2019 novel coronavirus  (SARS-CoV-2) nucleic acids may be present in the submitted sample  additional confirmatory testing may be necessary for epidemiological  and / or clinical management purposes  to differentiate between  SARS-CoV-2 and other Sarbecovirus currently known to infect humans.  If clinically indicated additional testing with an alternate test  methodology [phone]) is advised. The SARS-CoV-2 RNA is generally  detectable in upper and lower respiratory sp ecimens during the acute  phase of infection. The expected result is Negative. Fact Sheet for Patients:  StrictlyIdeas.no Fact Sheet for Healthcare Providers: BankingDealers.co.za This test is not yet approved or cleared by the [place] FDA and has been authorized for detection and/or diagnosis of SARS-CoV-2 by FDA under an Emergency Use Authorization (EUA).  This EUA will remain in effect (meaning this test can be used) for the duration of the COVID-19 declaration under Section 564(b)(1) of the Act, 21 U.S.C. section 360bbb-3(b)(1), unless the authorization is terminated or revoked sooner. Performed at [facility], [place] [address], [place] [address]    Creatinine: Recent Labs    11/25/18 1634  CREATININE 0.80   Baseline Creatinine: 0.8  Impression/Assessment:  72yo with right ureteral calculus, pyuria  Plan:  The risks/benefits/alternatives to right ureteral stent placement was explained to the patient and she understands and wishes to proceed with surgery  Nicolette Bang 11/25/2018, 5:46 PM

## 2018-11-25 NOTE — H&P (View-Only) (Signed)
Urology Admission H&P  Chief Complaint: right flank pain  History of Present Illness: Ms Smethers is a 73yo with a hx of nephrolithiasis who presented to my office today with a 1 week history of right flank pain. She was found to have a 1cm right ureteral calculus on CT. UA in my office was concerning for infection. No fevers/chills/sweats. No LUTS. Her pain is sharp, intermittent, mild and nonraditing. No other associated sign/symptoms. No exacerbating/alleviaitng events.  Past Medical History:  Diagnosis Date  . Allergy   . Diabetes mellitus 2007   type II  . Dysrhythmia 1998   "Fast heart rate. Used medication to stop it and restart. "  . GERD (gastroesophageal reflux disease)   . History of chicken pox   . History of kidney stones   . History of UTI   . Hypertension    Past Surgical History:  Procedure Laterality Date  . BREAST SURGERY  1085   breast biopsy benign  . CYSTOSCOPY WITH RETROGRADE PYELOGRAM, URETEROSCOPY AND STENT PLACEMENT Left 10/21/2016   Procedure: CYSTOSCOPY WITH RETROGRADE PYELOGRAM, LEFT URETEROSCOPY PYELOSCOPY  AND LEFT STENT PLACEMENT;  Surgeon: Carolan Clines, MD;  Location: [facility];  Service: Urology;  Laterality: Left;  . j basket retrieval     for kidney stones  . lipotripsy    . MENISCUS REPAIR  2006   rt knee    Home Medications:  Current Facility-Administered Medications  Medication Dose Route Frequency Provider Last Rate Last Dose  . ceFAZolin (ANCEF) IVPB 2g/100 mL premix  2 g Intravenous 30 min Pre-Op Cleon Gustin, MD      . lactated ringers infusion   Intravenous Continuous Albertha Ghee, MD 75 mL/hr at 11/25/18 1639     Allergies:  Allergies  Allergen Reactions  . Nsaids Anaphylaxis    Breathing problems   . Bactrim [Sulfamethoxazole-Trimethoprim]     Pt reported tongue swelling with open sores, ShOB, difficulty swallowing  . Celebrex [Celecoxib] Hives    And congestion  . Sulfa Antibiotics     Pt reports tongue swelling,  ShOB, difficulty swallowing  . Vioxx [Rofecoxib] Hives    And congestion  . Ibuprofen Other (See Comments)    Reactive airways     Family History  Problem Relation Age of Onset  . Cancer Mother        breast CA  . Arthritis Paternal Grandmother   . Stroke Paternal Grandmother   . Diabetes Paternal Grandmother   . Cancer Sister   . Breast cancer Sister   . Colon cancer Brother    Social History:  reports that she has never smoked. She has never used smokeless tobacco. She reports that she does not drink alcohol or use drugs.  Review of Systems  Genitourinary: Positive for flank pain.  All other systems reviewed and are negative.   Physical Exam:  Vital signs in last 24 hours: Temp:  [98 F (36.7 C)] 98 F (36.7 C) (07/02 1549) Pulse Rate:  [75] 75 (07/02 1549) Resp:  [16] 16 (07/02 1549) BP: (148)/(73) 148/73 (07/02 1549) SpO2:  [98 %] 98 % (07/02 1549) Weight:  [53.3 kg] 53.3 kg (07/02 1549) Physical Exam  Constitutional: She is oriented to person, place, and time. She appears well-developed and well-nourished.  HENT:  Head: Normocephalic and atraumatic.  Eyes: Pupils are equal, round, and reactive to light. EOM are normal.  Neck: Normal range of motion. No thyromegaly present.  Cardiovascular: Normal rate and regular rhythm.  Respiratory: Effort normal. No  respiratory distress.  GI: Soft. She exhibits no distension.  Musculoskeletal: Normal range of motion.        General: No edema.  Neurological: She is alert and oriented to person, place, and time.  Skin: Skin is warm and dry.  Psychiatric: She has a normal mood and affect. Her behavior is normal. Judgment and thought content normal.    Laboratory Data:  Results for orders placed or performed during the hospital encounter of 11/25/18 (from the past 24 hour(s))  Glucose, capillary     Status: None   Collection Time: 11/25/18  4:29 PM  Result Value Ref Range   Glucose-Capillary 89 70 - 99 mg/dL  Basic  metabolic panel     Status: Abnormal   Collection Time: 11/25/18  4:34 PM  Result Value Ref Range   Sodium 139 135 - 145 mmol/L   Potassium 3.3 (L) 3.5 - 5.1 mmol/L   Chloride 101 98 - 111 mmol/L   CO2 28 22 - 32 mmol/L   Glucose, Bld 101 (H) 70 - 99 mg/dL   BUN 23 8 - 23 mg/dL   Creatinine, Ser 0.80 0.44 - 1.00 mg/dL   Calcium 9.2 8.9 - 10.3 mg/dL   GFR calc non Af Amer >60 >60 mL/min   GFR calc Af Amer >60 >60 mL/min   Anion gap 10 5 - 15  CBC     Status: Abnormal   Collection Time: 11/25/18  4:34 PM  Result Value Ref Range   WBC 14.6 (H) 4.0 - 10.5 K/uL   RBC 4.14 3.87 - 5.11 MIL/uL   Hemoglobin 12.1 12.0 - 15.0 g/dL   HCT 36.1 36.0 - 46.0 %   MCV 87.2 80.0 - 100.0 fL   MCH 29.2 26.0 - 34.0 pg   MCHC 33.5 30.0 - 36.0 g/dL   RDW 12.7 11.5 - 15.5 %   Platelets 241 150 - 400 K/uL   nRBC 0.0 0.0 - 0.2 %   Recent Results (from the past 240 hour(s))  SARS Coronavirus 2 (CEPHEID - Performed in [place] hospital lab), Hosp Order     Status: None   Collection Time: 11/25/18  1:30 PM   Specimen: Nasopharyngeal Swab  Result Value Ref Range Status   SARS Coronavirus 2 NEGATIVE NEGATIVE Final    Comment: (NOTE) If result is NEGATIVE SARS-CoV-2 target nucleic acids are NOT DETECTED. The SARS-CoV-2 RNA is generally detectable in upper and lower  respiratory specimens during the acute phase of infection. The lowest  concentration of SARS-CoV-2 viral copies this assay can detect is 250  copies / mL. A negative result does not preclude SARS-CoV-2 infection  and should not be used as the sole basis for treatment or other  patient management decisions.  A negative result may occur with  improper specimen collection / handling, submission of specimen other  than nasopharyngeal swab, presence of viral mutation(s) within the  areas targeted by this assay, and inadequate number of viral copies  (<250 copies / mL). A negative result must be combined with clinical  observations, patient  history, and epidemiological information. If result is POSITIVE SARS-CoV-2 target nucleic acids are DETECTED. The SARS-CoV-2 RNA is generally detectable in upper and lower  respiratory specimens dur ing the acute phase of infection.  Positive  results are indicative of active infection with SARS-CoV-2.  Clinical  correlation with patient history and other diagnostic information is  necessary to determine patient infection status.  Positive results do  not rule out bacterial  infection or co-infection with other viruses. If result is PRESUMPTIVE POSTIVE SARS-CoV-2 nucleic acids MAY BE PRESENT.   A presumptive positive result was obtained on the submitted specimen  and confirmed on repeat testing.  While 2019 novel coronavirus  (SARS-CoV-2) nucleic acids may be present in the submitted sample  additional confirmatory testing may be necessary for epidemiological  and / or clinical management purposes  to differentiate between  SARS-CoV-2 and other Sarbecovirus currently known to infect humans.  If clinically indicated additional testing with an alternate test  methodology [phone]) is advised. The SARS-CoV-2 RNA is generally  detectable in upper and lower respiratory sp ecimens during the acute  phase of infection. The expected result is Negative. Fact Sheet for Patients:  StrictlyIdeas.no Fact Sheet for Healthcare Providers: BankingDealers.co.za This test is not yet approved or cleared by the [place] FDA and has been authorized for detection and/or diagnosis of SARS-CoV-2 by FDA under an Emergency Use Authorization (EUA).  This EUA will remain in effect (meaning this test can be used) for the duration of the COVID-19 declaration under Section 564(b)(1) of the Act, 21 U.S.C. section 360bbb-3(b)(1), unless the authorization is terminated or revoked sooner. Performed at [facility], [place] [address] [place],  [place] [address]    Creatinine: Recent Labs    11/25/18 1634  CREATININE 0.80   Baseline Creatinine: 0.8  Impression/Assessment:  72yo with right ureteral calculus, pyuria  Plan:  The risks/benefits/alternatives to right ureteral stent placement was explained to the patient and she understands and wishes to proceed with surgery  Nicolette Bang 11/25/2018, 5:46 PM

## 2018-11-25 NOTE — Anesthesia Preprocedure Evaluation (Signed)
Anesthesia Evaluation  Patient identified by MRN, date of birth, ID band Patient awake    Reviewed: Allergy & Precautions, H&P , NPO status , Patient's Chart, lab work & pertinent test results  Airway Mallampati: II   Neck ROM: full    Dental   Pulmonary neg pulmonary ROS,    breath sounds clear to auscultation       Cardiovascular hypertension,  Rhythm:regular Rate:Normal     Neuro/Psych    GI/Hepatic GERD  ,  Endo/Other  diabetes, Type 2  Renal/GU stones     Musculoskeletal   Abdominal   Peds  Hematology   Anesthesia Other Findings   Reproductive/Obstetrics                             Anesthesia Physical Anesthesia Plan  ASA: II  Anesthesia Plan: General   Post-op Pain Management:    Induction: Intravenous  PONV Risk Score and Plan: 3 and Ondansetron, Dexamethasone, Midazolam and Treatment may vary due to age or medical condition  Airway Management Planned: LMA  Additional Equipment:   Intra-op Plan:   Post-operative Plan:   Informed Consent: I have reviewed the patients History and Physical, chart, labs and discussed the procedure including the risks, benefits and alternatives for the proposed anesthesia with the patient or authorized representative who has indicated his/her understanding and acceptance.       Plan Discussed with: CRNA, Anesthesiologist and Surgeon  Anesthesia Plan Comments:         Anesthesia Quick Evaluation

## 2018-11-25 NOTE — Transfer of Care (Signed)
Immediate Anesthesia Transfer of Care Note  Patient: Laurie Tran  Procedure(s) Performed: CYSTOSCOPY WITH RETROGRADE PYELOGRAM/URETERAL STENT PLACEMENT (Right )  Patient Location: PACU  Anesthesia Type:General  Level of Consciousness: awake, alert , oriented and patient cooperative  Airway & Oxygen Therapy: Patient Spontanous Breathing and Patient connected to face mask oxygen  Post-op Assessment: Report given to RN and Post -op Vital signs reviewed and stable  Post vital signs: Reviewed and stable  Last Vitals:  Vitals Value Taken Time  BP 143/75 11/25/18 1830  Temp    Pulse 70 11/25/18 1831  Resp 13 11/25/18 1831  SpO2 100 % 11/25/18 1831  Vitals shown include unvalidated device data.  Last Pain:  Vitals:   11/25/18 1549  TempSrc: Oral  PainSc: 2       Patients Stated Pain Goal: 1 (51/46/04 7998)  Complications: No apparent anesthesia complications

## 2018-11-25 NOTE — Anesthesia Procedure Notes (Signed)
Procedure Name: LMA Insertion Date/Time: 11/25/2018 6:02 PM Performed by: Vincelette Pugh, CRNA Pre-anesthesia Checklist: Patient identified, Emergency Drugs available, Suction available, Patient being monitored and Timeout performed Patient Re-evaluated:Patient Re-evaluated prior to induction Oxygen Delivery Method: Circle system utilized Preoxygenation: Pre-oxygenation with 100% oxygen Induction Type: IV induction LMA: LMA with gastric port inserted LMA Size: 3.0 Number of attempts: 1 Placement Confirmation: positive ETCO2 and breath sounds checked- equal and bilateral Tube secured with: Tape Dental Injury: Teeth and Oropharynx as per pre-operative assessment

## 2018-11-25 NOTE — Anesthesia Postprocedure Evaluation (Signed)
Anesthesia Post Note  Patient: Laurie Tran  Procedure(s) Performed: CYSTOSCOPY WITH RETROGRADE PYELOGRAM/URETERAL STENT PLACEMENT (Right )     Patient location during evaluation: PACU Anesthesia Type: General Level of consciousness: awake and alert Pain management: pain level controlled Vital Signs Assessment: post-procedure vital signs reviewed and stable Respiratory status: spontaneous breathing, nonlabored ventilation, respiratory function stable and patient connected to nasal cannula oxygen Cardiovascular status: blood pressure returned to baseline and stable Postop Assessment: no apparent nausea or vomiting Anesthetic complications: no    Last Vitals:  Vitals:   11/25/18 1908 11/25/18 1915  BP: (!) 145/65   Pulse: (!) 56 (!) 57  Resp: 10 (!) 9  Temp:    SpO2: 100% 100%    Last Pain:  Vitals:   11/25/18 1915  TempSrc:   PainSc: Asleep                 Bobbie Virden S

## 2018-11-25 NOTE — Op Note (Signed)
Preoperative diagnosis: right proximal ureteral calculus, concern for sepsis  Postoperative diagnosis: Same  Procedure: 1 cystoscopy 2. right retrograde pyelography 3.  Intraoperative fluoroscopy, under one hour, with interpretation 4. right 6 x 24 JJ stent placement  Attending: Nicolette Bang  Anesthesia: General  Estimated blood loss: None  Drains: Right 6 x 24 JJ ureteral stent without tether, 16 French foley catheter  Specimens: none  Antibiotics: ancef  Findings:right proximal ureteral stone. Moderate hydronephrosis. No masses/lesions in the bladder. Ureteral orifices in normal anatomic location.  Indications: Patient is a 73 year old female with a history of right ureteral stone and concern for sepsis.  After discussing treatment options, they decided proceed with reft stent placement.  Procedure her in detail: The patient was brought to the operating room and a brief timeout was done to ensure correct patient, correct procedure, correct site.  General anesthesia was administered patient was placed in dorsal lithotomy position.  Their genitalia was then prepped and draped in usual sterile fashion.  A rigid 67 French cystoscope was passed in the urethra and the bladder.  Bladder was inspected free masses or lesions.  the ureteral orifices were in the normal orthotopic locations.  a 6 french ureteral catheter was then instilled into the right ureteral orifice.  a gentle retrograde was obtained and findings noted above.  we then placed a zip wire through the ureteral catheter and advanced up to the renal pelvis.    We then placed a 6 x 24 double-j ureteral stent over the original zip wire.  We then removed the wire and good coil was noted in the the renal pelvis under fluoroscopy and the bladder under direct vision.  A foley catheter was then placed. the bladder was then drained and this concluded the procedure which was well tolerated by patient.  Complications: None  Condition:  Stable, extubated, transferred to PACU  Plan: Patient is to be admitted for IV antibiotics. She will have her stone extraction in 2 weeks.

## 2018-11-26 ENCOUNTER — Encounter (HOSPITAL_COMMUNITY): Payer: Self-pay | Admitting: Urology

## 2018-11-26 DIAGNOSIS — N390 Urinary tract infection, site not specified: Secondary | ICD-10-CM

## 2018-11-26 DIAGNOSIS — N136 Pyonephrosis: Secondary | ICD-10-CM | POA: Diagnosis not present

## 2018-11-26 LAB — BASIC METABOLIC PANEL
Anion gap: 9 (ref 5–15)
BUN: 18 mg/dL (ref 8–23)
CO2: 26 mmol/L (ref 22–32)
Calcium: 9.1 mg/dL (ref 8.9–10.3)
Chloride: 107 mmol/L (ref 98–111)
Creatinine, Ser: 0.8 mg/dL (ref 0.44–1.00)
GFR calc Af Amer: >60 mL/min (ref >60)
GFR calc non Af Amer: >60 mL/min (ref >60)
Glucose, Bld: 144 mg/dL — ABNORMAL HIGH (ref 70–99)
Potassium: 3.6 mmol/L (ref 3.5–5.1)
Sodium: 142 mmol/L (ref 135–145)

## 2018-11-26 LAB — CBC
HCT: 37 % (ref 36.0–46.0)
Hemoglobin: 12.1 g/dL (ref 12.0–15.0)
MCH: 29.2 pg (ref 26.0–34.0)
MCHC: 32.7 g/dL (ref 30.0–36.0)
MCV: 89.4 fL (ref 80.0–100.0)
Platelets: 215 10*3/uL (ref 150–400)
RBC: 4.14 MIL/uL (ref 3.87–5.11)
RDW: 12.6 % (ref 11.5–15.5)
WBC: 9.7 10*3/uL (ref 4.0–10.5)
nRBC: 0 % (ref 0.0–0.2)

## 2018-11-26 LAB — GLUCOSE, CAPILLARY: Glucose-Capillary: 166 mg/dL — ABNORMAL HIGH (ref 70–99)

## 2018-11-26 MED ORDER — TRAMADOL HCL 50 MG PO TABS: 50.0000 mg | ORAL_TABLET | Freq: Four times a day (QID) | ORAL | 0 refills | Status: DC | PRN

## 2018-11-26 MED ORDER — CEFDINIR 300 MG PO CAPS: 300.0000 mg | ORAL_CAPSULE | Freq: Two times a day (BID) | ORAL | 0 refills | Status: DC

## 2018-11-26 NOTE — Discharge Summary (Signed)
Physician Discharge Summary  Patient ID: REATHA SUR MRN: [id number] DOB/AGE: [birth year] 73 y.o.  Admit date: 11/25/2018 Discharge date: 11/26/2018  Admission Diagnoses:  Ureteral calculus  Discharge Diagnoses:  Principal Problem:   Ureteral calculus Active Problems:   Urinary tract infection   Past Medical History:  Diagnosis Date  . Allergy   . Diabetes mellitus 2007   type II  . Dysrhythmia 1998   "Fast heart rate. Used medication to stop it and restart. "  . GERD (gastroesophageal reflux disease)   . History of chicken pox   . History of kidney stones   . History of UTI   . Hypertension     Surgeries: Procedure(s): CYSTOSCOPY WITH RETROGRADE PYELOGRAM/URETERAL STENT PLACEMENT on 11/25/2018   Consultants (if any):   Discharged Condition: Improved  Hospital Course: Laurie Tran is an 73 y.o. female who was admitted 11/25/2018 with a diagnosis of Ureteral calculus and went to the operating room on 11/25/2018 and underwent the above named procedures.  She did well overnight and her foley was removed.  She was sent home with cefdinir and tramadol.     She was given perioperative antibiotics:  Anti-infectives (From admission, onward)   Start     Dose/Rate Route Frequency Ordered Stop   11/26/18 0000  cefdinir (OMNICEF) 300 MG capsule     300 mg Oral 2 times daily 11/26/18 0932     11/25/18 2200  cefTRIAXone (ROCEPHIN) 1 g in sodium chloride 0.9 % 100 mL IVPB     1 g 200 mL/hr over 30 Minutes Intravenous Every 24 hours 11/25/18 2052     11/25/18 1532  ceFAZolin (ANCEF) IVPB 2g/100 mL premix     2 g 200 mL/hr over 30 Minutes Intravenous 30 min pre-op 11/25/18 1532 11/25/18 1833    .  She was given sequential compression devices for DVT prophylaxis.  She benefited maximally from the hospital stay and there were no complications.    Recent vital signs:  Vitals:   11/26/18 0100 11/26/18 0515  BP: 131/71 139/65  Pulse: 64 68  Resp: 14 16  Temp: 97.9 F (36.6 C)  98.1 F (36.7 C)  SpO2: 94%     Recent laboratory studies:  Lab Results  Component Value Date   HGB 12.1 11/26/2018   HGB 11.3 (L) 11/25/2018   HGB 12.1 11/25/2018   Lab Results  Component Value Date   WBC 9.7 11/26/2018   PLT 215 11/26/2018   No results found for: INR Lab Results  Component Value Date   NA 142 11/26/2018   K 3.6 11/26/2018   CL 107 11/26/2018   CO2 26 11/26/2018   BUN 18 11/26/2018   CREATININE 0.80 11/26/2018   GLUCOSE 144 (H) 11/26/2018    Discharge Medications:   Allergies as of 11/26/2018      Reactions   Bactrim [sulfamethoxazole-trimethoprim] Anaphylaxis, Shortness Of Breath   Pt reported tongue swelling with open sores, ShOB, difficulty swallowing   Nsaids Anaphylaxis   Breathing problems    Sulfa Antibiotics Anaphylaxis, Shortness Of Breath   Pt reports tongue swelling, ShOB, difficulty swallowing   Celebrex [celecoxib] Hives, Other (See Comments)   Congestion   Vioxx [rofecoxib] Hives, Other (See Comments)   Congestion   Ibuprofen Other (See Comments)   Reactive airways       Medication List    TAKE these medications   atorvastatin 10 MG tablet Commonly known as: LIPITOR Take 1 tablet (10 mg total) by mouth daily.  cefdinir 300 MG capsule Commonly known as: OMNICEF Take 1 capsule (300 mg total) by mouth 2 (two) times daily.   clobetasol ointment 0.05 % Commonly known as: TEMOVATE Apply at bedtime to the affected area What changed:   how much to take  how to take this  when to take this  reasons to take this  additional instructions   EPINEPHrine 0.3 mg/0.3 mL Soaj injection Commonly known as: EPI-PEN Inject 0.3 mg into the muscle as needed for anaphylaxis.   fexofenadine 180 MG tablet Commonly known as: ALLEGRA TAKE 1 TABLET (180 MG TOTAL) BY MOUTH DAILY. What changed: See the new instructions.   hydrochlorothiazide 25 MG tablet Commonly known as: HYDRODIURIL Take 1 tablet (25 mg total) by mouth daily.    metFORMIN 500 MG tablet Commonly known as: GLUCOPHAGE TAKE 1 TABLET BY MOUTH EVERY DAY WITH BREAKFAST What changed: See the new instructions.   mometasone 50 MCG/ACT nasal spray Commonly known as: NASONEX Place 2 sprays into the nose daily as needed. What changed: reasons to take this   potassium chloride 10 MEQ tablet Commonly known as: K-DUR Take 1 tablet (10 mEq total) by mouth daily.   PRESERVISION AREDS 2 PO Take 1 capsule by mouth daily.   MULTIVITAMIN GUMMIES ADULT PO Take 2 each by mouth daily. Vitafusion gummies   traMADol 50 MG tablet Commonly known as: Ultram Take 1 tablet (50 mg total) by mouth every 6 (six) hours as needed for moderate pain.       Diagnostic Studies: Dg C-arm 1-60 Min-no Report  Result Date: 11/25/2018 Fluoroscopy was utilized by the requesting physician.  No radiographic interpretation.    Disposition: Discharge disposition: 01-Home or Self Care         Follow-up Information    [facility] Follow up.   Why: If you have not heard from the office by Monday afternoon, please call to arrange f/u.  Contact information: [address]           Signed: Irine Seal 11/26/2018, 9:38 AM

## 2018-11-26 NOTE — Progress Notes (Signed)
All DC instructions were gone over with pt. And packet given to take home. Pt verbalized an understanding of all instructions given and had no further questions. Home meds were picked at pharmacy prior to DC. IV was removed and WDL. Pt DC'd via wheelchair  to [facility].

## 2018-11-26 NOTE — Discharge Instructions (Signed)

## 2018-11-28 ENCOUNTER — Other Ambulatory Visit: Payer: Self-pay | Admitting: Family Medicine

## 2018-11-30 ENCOUNTER — Other Ambulatory Visit: Payer: Self-pay | Admitting: Urology

## 2018-12-08 ENCOUNTER — Encounter: Payer: Self-pay | Admitting: Family Medicine

## 2018-12-16 ENCOUNTER — Other Ambulatory Visit (HOSPITAL_COMMUNITY)
Admission: RE | Admit: 2018-12-16 | Discharge: 2018-12-16 | Disposition: A | Discharge status: Home / Self Care | Payer: PRIVATE HEALTH INSURANCE | Source: Ambulatory Visit | Attending: Urology | Admitting: Urology

## 2018-12-16 DIAGNOSIS — Z1159 Encounter for screening for other viral diseases: Secondary | ICD-10-CM | POA: Diagnosis not present

## 2018-12-16 LAB — SARS CORONAVIRUS 2 (TAT 6-24 HRS): SARS Coronavirus 2: NEGATIVE

## 2018-12-17 ENCOUNTER — Encounter (HOSPITAL_BASED_OUTPATIENT_CLINIC_OR_DEPARTMENT_OTHER): Payer: Self-pay | Admitting: *Deleted

## 2018-12-17 ENCOUNTER — Other Ambulatory Visit: Payer: Self-pay

## 2018-12-17 NOTE — Progress Notes (Addendum)
Spoke w/ pt via phone for pre-op interview.  Npo after mn.  Arrive at 0930.  Needs istat 8.  Current ekg in chart and epic.    Pt had covid test done 12-16-2018.  Will take allegra am dos w/ sips of water and if needed may take tramadol.

## 2018-12-20 ENCOUNTER — Other Ambulatory Visit: Payer: Self-pay

## 2018-12-20 ENCOUNTER — Encounter (HOSPITAL_BASED_OUTPATIENT_CLINIC_OR_DEPARTMENT_OTHER): Payer: Self-pay | Admitting: Anesthesiology

## 2018-12-20 ENCOUNTER — Encounter (HOSPITAL_BASED_OUTPATIENT_CLINIC_OR_DEPARTMENT_OTHER)
Admission: RE | Disposition: A | Discharge status: Home / Self Care | Payer: Self-pay | Source: Home / Self Care | Attending: Urology

## 2018-12-20 ENCOUNTER — Ambulatory Visit (HOSPITAL_BASED_OUTPATIENT_CLINIC_OR_DEPARTMENT_OTHER): Payer: No Typology Code available for payment source | Admitting: Anesthesiology

## 2018-12-20 ENCOUNTER — Ambulatory Visit (HOSPITAL_COMMUNITY)
Admission: RE | Admit: 2018-12-20 | Discharge: 2018-12-20 | Disposition: A | Discharge status: Home / Self Care | Payer: No Typology Code available for payment source | Attending: Urology | Admitting: Urology

## 2018-12-20 DIAGNOSIS — I10 Essential (primary) hypertension: Secondary | ICD-10-CM | POA: Diagnosis not present

## 2018-12-20 DIAGNOSIS — Z888 Allergy status to other drugs, medicaments and biological substances status: Secondary | ICD-10-CM | POA: Insufficient documentation

## 2018-12-20 DIAGNOSIS — Z881 Allergy status to other antibiotic agents status: Secondary | ICD-10-CM | POA: Insufficient documentation

## 2018-12-20 DIAGNOSIS — Z823 Family history of stroke: Secondary | ICD-10-CM | POA: Insufficient documentation

## 2018-12-20 DIAGNOSIS — Z809 Family history of malignant neoplasm, unspecified: Secondary | ICD-10-CM | POA: Diagnosis not present

## 2018-12-20 DIAGNOSIS — Z8261 Family history of arthritis: Secondary | ICD-10-CM | POA: Diagnosis not present

## 2018-12-20 DIAGNOSIS — Z833 Family history of diabetes mellitus: Secondary | ICD-10-CM | POA: Diagnosis not present

## 2018-12-20 DIAGNOSIS — Z882 Allergy status to sulfonamides status: Secondary | ICD-10-CM | POA: Diagnosis not present

## 2018-12-20 DIAGNOSIS — Z886 Allergy status to analgesic agent status: Secondary | ICD-10-CM | POA: Insufficient documentation

## 2018-12-20 DIAGNOSIS — E119 Type 2 diabetes mellitus without complications: Secondary | ICD-10-CM | POA: Insufficient documentation

## 2018-12-20 DIAGNOSIS — Z800 Family history of malignant neoplasm of digestive organs: Secondary | ICD-10-CM | POA: Insufficient documentation

## 2018-12-20 DIAGNOSIS — N132 Hydronephrosis with renal and ureteral calculous obstruction: Secondary | ICD-10-CM | POA: Insufficient documentation

## 2018-12-20 DIAGNOSIS — Z87442 Personal history of urinary calculi: Secondary | ICD-10-CM | POA: Diagnosis not present

## 2018-12-20 DIAGNOSIS — N201 Calculus of ureter: Secondary | ICD-10-CM | POA: Diagnosis present

## 2018-12-20 DIAGNOSIS — Z803 Family history of malignant neoplasm of breast: Secondary | ICD-10-CM | POA: Diagnosis not present

## 2018-12-20 DIAGNOSIS — K219 Gastro-esophageal reflux disease without esophagitis: Secondary | ICD-10-CM | POA: Diagnosis not present

## 2018-12-20 HISTORY — DX: Type 2 diabetes mellitus without complications: E11.9

## 2018-12-20 HISTORY — DX: Personal history of other diseases of the digestive system: Z87.19

## 2018-12-20 HISTORY — DX: Unspecified osteoarthritis, unspecified site: M19.90

## 2018-12-20 HISTORY — PX: CYSTOSCOPY WITH RETROGRADE PYELOGRAM, URETEROSCOPY AND STENT PLACEMENT: SHX5789

## 2018-12-20 HISTORY — DX: Calculus of ureter: N20.1

## 2018-12-20 HISTORY — DX: Frequency of micturition: R35.0

## 2018-12-20 HISTORY — DX: Nocturia: R35.1

## 2018-12-20 HISTORY — DX: Calculus of kidney: N20.0

## 2018-12-20 HISTORY — DX: Urgency of urination: R39.15

## 2018-12-20 HISTORY — DX: Allergic rhinitis, unspecified: J30.9

## 2018-12-20 HISTORY — PX: HOLMIUM LASER APPLICATION: SHX5852

## 2018-12-20 HISTORY — DX: Hematuria, unspecified: R31.9

## 2018-12-20 HISTORY — DX: Presence of spectacles and contact lenses: Z97.3

## 2018-12-20 HISTORY — DX: Personal history of other medical treatment: Z92.89

## 2018-12-20 LAB — POCT I-STAT, CHEM 8
BUN: 17 mg/dL (ref 8–23)
Calcium, Ion: 1.29 mmol/L (ref 1.15–1.40)
Chloride: 102 mmol/L (ref 98–111)
Creatinine, Ser: 0.9 mg/dL (ref 0.44–1.00)
Glucose, Bld: 95 mg/dL (ref 70–99)
HCT: 41 % (ref 36.0–46.0)
Hemoglobin: 13.9 g/dL (ref 12.0–15.0)
Potassium: 3.5 mmol/L (ref 3.5–5.1)
Sodium: 143 mmol/L (ref 135–145)
TCO2: 27 mmol/L (ref 22–32)

## 2018-12-20 LAB — GLUCOSE, CAPILLARY: Glucose-Capillary: 83 mg/dL (ref 70–99)

## 2018-12-20 SURGERY — CYSTOURETEROSCOPY, WITH RETROGRADE PYELOGRAM AND STENT INSERTION
Anesthesia: General | Site: Renal | Laterality: Right

## 2018-12-20 MED ORDER — LIDOCAINE HCL (CARDIAC) PF 100 MG/5ML IV SOSY
PREFILLED_SYRINGE | INTRAVENOUS | Status: DC | PRN
  Administered 2018-12-20: 60 mg via INTRAVENOUS

## 2018-12-20 MED ORDER — SODIUM CHLORIDE 0.9 % IR SOLN
Status: DC | PRN
  Administered 2018-12-20: 3000 mL

## 2018-12-20 MED ORDER — PROPOFOL 10 MG/ML IV BOLUS
INTRAVENOUS | Status: DC | PRN
  Administered 2018-12-20: 100 mg via INTRAVENOUS

## 2018-12-20 MED ORDER — CEFAZOLIN SODIUM-DEXTROSE 2-4 GM/100ML-% IV SOLN
INTRAVENOUS | Status: AC
  Filled 2018-12-20: qty 100

## 2018-12-20 MED ORDER — DEXAMETHASONE SODIUM PHOSPHATE 10 MG/ML IJ SOLN
INTRAMUSCULAR | Status: DC | PRN
  Administered 2018-12-20: 4 mg via INTRAVENOUS

## 2018-12-20 MED ORDER — LACTATED RINGERS IV SOLN
INTRAVENOUS | Status: DC
  Administered 2018-12-20: 10:00:00 via INTRAVENOUS
  Filled 2018-12-20: qty 1000

## 2018-12-20 MED ORDER — LIDOCAINE 2% (20 MG/ML) 5 ML SYRINGE
INTRAMUSCULAR | Status: AC
  Filled 2018-12-20: qty 5

## 2018-12-20 MED ORDER — CEFAZOLIN SODIUM-DEXTROSE 2-4 GM/100ML-% IV SOLN
2.0000 g | INTRAVENOUS | Status: AC
  Administered 2018-12-20: 2 g via INTRAVENOUS
  Filled 2018-12-20: qty 100

## 2018-12-20 MED ORDER — ONDANSETRON HCL 4 MG/2ML IJ SOLN
INTRAMUSCULAR | Status: DC | PRN
  Administered 2018-12-20: 4 mg via INTRAVENOUS

## 2018-12-20 MED ORDER — IOHEXOL 300 MG/ML  SOLN
INTRAMUSCULAR | Status: DC | PRN
  Administered 2018-12-20: 10 mL

## 2018-12-20 MED ORDER — PROPOFOL 10 MG/ML IV BOLUS
INTRAVENOUS | Status: AC
  Filled 2018-12-20: qty 20

## 2018-12-20 MED ORDER — ONDANSETRON HCL 4 MG/2ML IJ SOLN
INTRAMUSCULAR | Status: AC
  Filled 2018-12-20: qty 2

## 2018-12-20 MED ORDER — PROMETHAZINE HCL 25 MG/ML IJ SOLN
6.2500 mg | INTRAMUSCULAR | Status: DC | PRN
  Filled 2018-12-20: qty 1

## 2018-12-20 MED ORDER — FENTANYL CITRATE (PF) 100 MCG/2ML IJ SOLN
INTRAMUSCULAR | Status: AC
  Filled 2018-12-20: qty 2

## 2018-12-20 MED ORDER — FENTANYL CITRATE (PF) 100 MCG/2ML IJ SOLN
INTRAMUSCULAR | Status: DC | PRN
  Administered 2018-12-20 (×2): 25 ug via INTRAVENOUS
  Administered 2018-12-20: 50 ug via INTRAVENOUS

## 2018-12-20 MED ORDER — FENTANYL CITRATE (PF) 100 MCG/2ML IJ SOLN
25.0000 ug | INTRAMUSCULAR | Status: DC | PRN
  Filled 2018-12-20: qty 1

## 2018-12-20 MED ORDER — DEXAMETHASONE SODIUM PHOSPHATE 10 MG/ML IJ SOLN
INTRAMUSCULAR | Status: AC
  Filled 2018-12-20: qty 1

## 2018-12-20 MED ORDER — TRAMADOL HCL 50 MG PO TABS: 50.0000 mg | ORAL_TABLET | Freq: Four times a day (QID) | ORAL | 0 refills | Status: DC | PRN

## 2018-12-20 MED ORDER — ACETAMINOPHEN 10 MG/ML IV SOLN
1000.0000 mg | Freq: Once | INTRAVENOUS | Status: DC | PRN
  Filled 2018-12-20: qty 100

## 2018-12-20 SURGICAL SUPPLY — 29 items
BAG DRAIN URO-CYSTO SKYTR STRL (DRAIN) ×2 IMPLANT
BASKET STONE 1.7 NGAGE (UROLOGICAL SUPPLIES) IMPLANT
CATH INTERMIT  6FR 70CM (CATHETERS) IMPLANT
CLOTH BEACON ORANGE TIMEOUT ST (SAFETY) ×2 IMPLANT
EXTRACTOR STONE 1.7FRX115CM (UROLOGICAL SUPPLIES) ×1 IMPLANT
FIBER LASER FLEXIVA 365 (UROLOGICAL SUPPLIES) IMPLANT
FIBER LASER TRAC TIP (UROLOGICAL SUPPLIES) ×1 IMPLANT
GLOVE BIO SURGEON STRL SZ 6.5 (GLOVE) ×1 IMPLANT
GLOVE BIO SURGEON STRL SZ8 (GLOVE) ×2 IMPLANT
GLOVE BIOGEL PI IND STRL 6.5 (GLOVE) IMPLANT
GLOVE BIOGEL PI INDICATOR 6.5 (GLOVE) ×1
GLOVE SURG SS PI 6.5 STRL IVOR (GLOVE) ×2 IMPLANT
GOWN STRL REUS W/ TWL LRG LVL3 (GOWN DISPOSABLE) IMPLANT
GOWN STRL REUS W/TWL LRG LVL3 (GOWN DISPOSABLE) ×3
GOWN STRL REUS W/TWL XL LVL3 (GOWN DISPOSABLE) ×2 IMPLANT
GUIDEWIRE STR DUAL SENSOR (WIRE) IMPLANT
GUIDEWIRE ZIPWRE .038 STRAIGHT (WIRE) ×2 IMPLANT
IV NS 1000ML (IV SOLUTION)
IV NS 1000ML BAXH (IV SOLUTION) ×1 IMPLANT
IV NS IRRIG 3000ML ARTHROMATIC (IV SOLUTION) ×2 IMPLANT
KIT TURNOVER CYSTO (KITS) ×2 IMPLANT
MANIFOLD NEPTUNE II (INSTRUMENTS) ×2 IMPLANT
NS IRRIG 500ML POUR BTL (IV SOLUTION) ×2 IMPLANT
PACK CYSTO (CUSTOM PROCEDURE TRAY) ×2 IMPLANT
STENT URET 6FRX24 CONTOUR (STENTS) ×1 IMPLANT
STENT URET 6FRX26 CONTOUR (STENTS) IMPLANT
SYR 10ML LL (SYRINGE) ×2 IMPLANT
TUBE CONNECTING 12X1/4 (SUCTIONS) IMPLANT
TUBING UROLOGY SET (TUBING) ×2 IMPLANT

## 2018-12-20 NOTE — Discharge Instructions (Signed)
Ureteral Stent Implantation, Care After °This sheet gives you information about how to care for yourself after your procedure. Your health care provider may also give you more specific instructions. If you have problems or questions, contact your health care provider. °What can I expect after the procedure? °After the procedure, it is common to have: °· Nausea. °· Mild pain when you urinate. You may feel this pain in your lower back or lower abdomen. The pain should stop within a few minutes after you urinate. This may last for up to 1 week. °· A small amount of blood in your urine for several days. °Follow these instructions at home: °Medicines °· Take over-the-counter and prescription medicines only as told by your health care provider. °· If you were prescribed an antibiotic medicine, take it as told by your health care provider. Do not stop taking the antibiotic even if you start to feel better. °· Do not drive for 24 hours if you were given a sedative during your procedure. °· Ask your health care provider if the medicine prescribed to you requires you to avoid driving or using heavy machinery. °Activity °· Rest as told by your health care provider. °· Avoid sitting for a long time without moving. Get up to take short walks every 1-2 hours. This is important to improve blood flow and breathing. Ask for help if you feel weak or unsteady. °· Return to your normal activities as told by your health care provider. Ask your health care provider what activities are safe for you. °General instructions ° °· Watch for any blood in your urine. Call your health care provider if the amount of blood in your urine increases. °· If you have a catheter: °? Follow instructions from your health care provider about taking care of your catheter and collection bag. °? Do not take baths, swim, or use a hot tub until your health care provider approves. Ask your health care provider if you may take showers. You may only be allowed to  take sponge baths. °· Drink enough fluid to keep your urine pale yellow. °· Do not use any products that contain nicotine or tobacco, such as cigarettes, e-cigarettes, and chewing tobacco. These can delay healing after surgery. If you need help quitting, ask your health care provider. °· Keep all follow-up visits as told by your health care provider. This is important. °Contact a health care provider if: °· You have pain that gets worse or does not get better with medicine, especially pain when you urinate. °· You have difficulty urinating. °· You feel nauseous or you vomit repeatedly during a period of more than 2 days after the procedure. °Get help right away if: °· Your urine is dark red or has blood clots in it. °· You are leaking urine (have incontinence). °· The end of the stent comes out of your urethra. °· You cannot urinate. °· You have sudden, sharp, or severe pain in your abdomen or lower back. °· You have a fever. °· You have swelling or pain in your legs. °· You have difficulty breathing. °Summary °· After the procedure, it is common to have mild pain when you urinate that goes away within a few minutes after you urinate. This may last for up to 1 week. °· Watch for any blood in your urine. Call your health care provider if the amount of blood in your urine increases. °· Take over-the-counter and prescription medicines only as told by your health care provider. °· Drink   enough fluid to keep your urine pale yellow. This information is not intended to replace advice given to you by your health care provider. Make sure you discuss any questions you have with your health care provider. Document Released: 01/12/2013 Document Revised: 02/16/2018 Document Reviewed: 02/17/2018 Elsevier Patient Education  [place] Instructions  Activity: Get plenty of rest for the remainder of the day. A responsible adult should stay with you for 24 hours following the procedure.    For the next 24 hours, DO NOT: -Drive a car -Paediatric nurse -Drink alcoholic beverages -Take any medication unless instructed by your physician -Make any legal decisions or sign important papers.  Meals: Start with liquid foods such as gelatin or soup. Progress to regular foods as tolerated. Avoid greasy, spicy, heavy foods. If nausea and/or vomiting occur, drink only clear liquids until the nausea and/or vomiting subsides. Call your physician if vomiting continues.  Special Instructions/Symptoms: Your throat may feel dry or sore from the anesthesia or the breathing tube placed in your throat during surgery. If this causes discomfort, gargle with warm salt water. The discomfort should disappear within 24 hours.  If you had a scopolamine patch placed behind your ear for the management of post- operative nausea and/or vomiting:  1. The medication in the patch is effective for 72 hours, after which it should be removed.  Wrap patch in a tissue and discard in the trash. Wash hands thoroughly with soap and water. 2. You may remove the patch earlier than 72 hours if you experience unpleasant side effects which may include dry mouth, dizziness or visual disturbances. 3. Avoid touching the patch. Wash your hands with soap and water after contact with the patch.

## 2018-12-20 NOTE — Anesthesia Procedure Notes (Signed)
Procedure Name: LMA Insertion Date/Time: 12/20/2018 11:43 AM Performed by: Justice Rocher, CRNA Pre-anesthesia Checklist: Patient identified, Emergency Drugs available, Suction available and Patient being monitored Patient Re-evaluated:Patient Re-evaluated prior to induction Oxygen Delivery Method: Circle system utilized Preoxygenation: Pre-oxygenation with 100% oxygen Induction Type: IV induction Ventilation: Mask ventilation without difficulty LMA: LMA inserted LMA Size: 4.0 Number of attempts: 1 Airway Equipment and Method: Bite block Placement Confirmation: positive ETCO2 and breath sounds checked- equal and bilateral Tube secured with: Tape Dental Injury: Teeth and Oropharynx as per pre-operative assessment

## 2018-12-20 NOTE — Transfer of Care (Signed)
Immediate Anesthesia Transfer of Care Note  Patient: Laurie Tran  Procedure(s) Performed: Procedure(s) (LRB): CYSTOSCOPY WITH RIGHT RETROGRADE URETEROSCOPY WITH LASER  AND STENT REPLACEMENT (Right) HOLMIUM LASER APPLICATION (Right)  Patient Location: PACU  Anesthesia Type: General  Level of Consciousness: awake, sedated, patient cooperative and responds to stimulation  Airway & Oxygen Therapy: Patient Spontanous Breathing and Patient connected to [place] O2 and soft FM  Post-op Assessment: Report given to PACU RN, Post -op Vital signs reviewed and stable and Patient moving all extremities  Post vital signs: Reviewed and stable  Complications: No apparent anesthesia complications

## 2018-12-20 NOTE — Op Note (Signed)
Preoperative diagnosis: Right ureteral stone  Postoperative diagnosis: Same  Procedure: 1 cystoscopy 2 right retrograde pyelography 3.  Intraoperative fluoroscopy, under one hour, with interpretation 4.  Right ureteroscopic stone manipulation with laser lithotripsy 5.  Right 6 x 24 JJ stent exchange  Attending: Rosie Fate  Anesthesia: General  Estimated blood loss: None  Drains: Right 6 x 24 JJ ureteral stent without tether  Specimens: stone for analysis  Antibiotics: ancef  Findings: right impacted proximal ureteral calculus. Mild hydronephrosis..  Indications: Patient is a 73 year old female with a history of ureteral stone and who has failed medical expulsive therapy. She previously underwent stent placement due to sepsis.  After discussing treatment options, she decided proceed with right ureteroscopic stone manipulation.  Procedure her in detail: The patient was brought to the operating room and a brief timeout was done to ensure correct patient, correct procedure, correct site.  General anesthesia was administered patient was placed in dorsal lithotomy position.  Her genitalia was then prepped and draped in usual sterile fashion.  A rigid 84 French cystoscope was passed in the urethra and the bladder.  Bladder was inspected free masses or lesions.  the right ureteral orifices were in the normal orthotopic locations.  Using a grasper the ureteral stent was brought to the urethral meatus. A zipwire was advanced through the stent and up to the renal pelvis. The stent was then removeda 6 french ureteral catheter was then instilled into the right ureter orifice.  a gentle retrograde was obtained and findings noted above.  we then placed a zip wire through the ureteral catheter and advanced up to the renal pelvis.  we then removed the cystoscope and cannulated the right ureteral orifice with a semirigid ureteroscope.  we then encountered the stone in the proximal ureter.  using a 200  nm laser fiber and fragmented the stone into smaller pieces.  the pieces were then removed with a Ngage basket.  Once all stone fragments were removed we then placed a 6 x 26 double-j ureteral stent over the original zip wire. We then removed the wire and good coil was noted in the the renal pelvis under fluoroscopy and the bladder under direct vision.    the stone fragments were then removed from the bladder and sent for analysis.   the bladder was then drained and this concluded the procedure which was well tolerated by patient.  Complications: None  Condition: Stable, extubated, transferred to PACU  Plan: Patient is to be discharged home as to follow-up in one week for stent removal.

## 2018-12-20 NOTE — Anesthesia Preprocedure Evaluation (Addendum)
Anesthesia Evaluation  Patient identified by MRN, date of birth, ID band Patient awake    Reviewed: Allergy & Precautions, NPO status , Patient's Chart, lab work & pertinent test results  History of Anesthesia Complications Negative for: history of anesthetic complications  Airway Mallampati: II  TM Distance: >3 FB Neck ROM: Full    Dental no notable dental hx.    Pulmonary neg pulmonary ROS,    Pulmonary exam normal        Cardiovascular hypertension, Pt. on medications Normal cardiovascular exam     Neuro/Psych negative neurological ROS     GI/Hepatic Neg liver ROS, GERD  Controlled,  Endo/Other  diabetes, Type 2, Oral Hypoglycemic Agents  Renal/GU Renal stones     Musculoskeletal  (+) Arthritis ,   Abdominal   Peds  Hematology negative hematology ROS (+)   Anesthesia Other Findings Day of surgery medications reviewed with the patient.  Reproductive/Obstetrics                            Anesthesia Physical Anesthesia Plan  ASA: II  Anesthesia Plan: General   Post-op Pain Management:    Induction: Intravenous  PONV Risk Score and Plan: 3 and Treatment may vary due to age or medical condition, Ondansetron and Dexamethasone  Airway Management Planned: LMA  Additional Equipment:   Intra-op Plan:   Post-operative Plan: Extubation in OR  Informed Consent: I have reviewed the patients History and Physical, chart, labs and discussed the procedure including the risks, benefits and alternatives for the proposed anesthesia with the patient or authorized representative who has indicated his/her understanding and acceptance.     Dental advisory given  Plan Discussed with: CRNA  Anesthesia Plan Comments:        Anesthesia Quick Evaluation

## 2018-12-20 NOTE — Interval H&P Note (Signed)
History and Physical Interval Note:  12/20/2018 11:30 AM  Laurie Tran  has presented today for surgery, with the diagnosis of RIGHT URETERAL STONE.  The various methods of treatment have been discussed with the patient and family. After consideration of risks, benefits and other options for treatment, the patient has consented to  Procedure(s): CYSTOSCOPY WITH RIGHT RETROGRADE URETEROSCOPY WITH LASER  AND STENT PLACEMENT (Right) as a surgical intervention.  The patient's history has been reviewed, patient examined, no change in status, stable for surgery.  I have reviewed the patient's chart and labs.  Questions were answered to the patient's satisfaction.     Nicolette Bang

## 2018-12-20 NOTE — Anesthesia Postprocedure Evaluation (Signed)
Anesthesia Post Note  Patient: Laurie Tran  Procedure(s) Performed: CYSTOSCOPY WITH RIGHT RETROGRADE URETEROSCOPY WITH LASER  AND STENT REPLACEMENT (Right Renal) HOLMIUM LASER APPLICATION (Right Renal)     Patient location during evaluation: PACU Anesthesia Type: General Level of consciousness: awake and alert Pain management: pain level controlled Vital Signs Assessment: post-procedure vital signs reviewed and stable Respiratory status: spontaneous breathing, nonlabored ventilation and respiratory function stable Cardiovascular status: blood pressure returned to baseline and stable Postop Assessment: no apparent nausea or vomiting Anesthetic complications: no    Last Vitals:  Vitals:   12/20/18 1300 12/20/18 1315  BP: (!) 159/82 (!) 147/71  Pulse: 65 65  Resp: 13 17  Temp:    SpO2:  98%    Last Pain:  Vitals:   12/20/18 1315  TempSrc:   PainSc: 0-No pain                 Brennan Bailey

## 2018-12-21 ENCOUNTER — Encounter (HOSPITAL_BASED_OUTPATIENT_CLINIC_OR_DEPARTMENT_OTHER): Payer: Self-pay | Admitting: Urology

## 2019-01-16 ENCOUNTER — Other Ambulatory Visit: Payer: Self-pay | Admitting: Family Medicine

## 2019-01-17 NOTE — Telephone Encounter (Signed)
Pt left v/m is out of HCTZ and request refilled and pt will cb for scheduling CPX after 02-11-19. I spoke with pt and advised with refill HCZ and metformin # 90 0n each and pt will cb for CPX appt. [facility] [place].

## 2019-01-27 ENCOUNTER — Other Ambulatory Visit: Payer: Self-pay | Admitting: *Deleted

## 2019-01-27 MED ORDER — ATORVASTATIN CALCIUM 10 MG PO TABS: 10.0000 mg | ORAL_TABLET | Freq: Every day | ORAL | 0 refills | Status: DC

## 2019-02-05 ENCOUNTER — Encounter: Payer: Self-pay | Admitting: Family Medicine

## 2019-02-17 ENCOUNTER — Other Ambulatory Visit: Payer: Self-pay

## 2019-02-17 ENCOUNTER — Ambulatory Visit: Payer: Self-pay | Admitting: *Deleted

## 2019-02-17 VITALS — BP 134/85 | HR 80

## 2019-02-17 DIAGNOSIS — Z0000 Encounter for general adult medical examination without abnormal findings: Secondary | ICD-10-CM

## 2019-02-17 NOTE — Progress Notes (Signed)
Pt requests results be released to MyChart and see will review notes and f/u with clinic staff if needed. Labs drawn for pcp annual visit scheduled 03/04/19.

## 2019-02-18 LAB — CMP12+LP+TP+TSH+6AC+CBC/D/PLT
ALT: 13 IU/L (ref 0–32)
AST: 20 IU/L (ref 0–40)
Albumin/Globulin Ratio: 2.1 (ref 1.2–2.2)
Albumin: 4.4 g/dL (ref 3.7–4.7)
Alkaline Phosphatase: 119 IU/L — ABNORMAL HIGH (ref 39–117)
BUN/Creatinine Ratio: 22 (ref 12–28)
BUN: 18 mg/dL (ref 8–27)
Basophils Absolute: 0.1 10*3/uL (ref 0.0–0.2)
Basos: 1 %
Bilirubin Total: 0.3 mg/dL (ref 0.0–1.2)
Calcium: 9.8 mg/dL (ref 8.7–10.3)
Chloride: 104 mmol/L (ref 96–106)
Chol/HDL Ratio: 3.1 ratio (ref 0.0–4.4)
Cholesterol, Total: 156 mg/dL (ref 100–199)
Creatinine, Ser: 0.82 mg/dL (ref 0.57–1.00)
EOS (ABSOLUTE): 0.2 10*3/uL (ref 0.0–0.4)
Eos: 3 %
Estimated CHD Risk: <0.5 times avg. (ref 0.0–1.0)
Free Thyroxine Index: 2.2 (ref 1.2–4.9)
GFR calc Af Amer: 83 mL/min/{1.73_m2} (ref >59)
GFR calc non Af Amer: 72 mL/min/{1.73_m2} (ref >59)
GGT: 5 IU/L (ref 0–60)
Globulin, Total: 2.1 g/dL (ref 1.5–4.5)
Glucose: 91 mg/dL (ref 65–99)
HDL: 50 mg/dL (ref >39)
Hematocrit: 38.2 % (ref 34.0–46.6)
Hemoglobin: 12.7 g/dL (ref 11.1–15.9)
Immature Grans (Abs): 0 10*3/uL (ref 0.0–0.1)
Immature Granulocytes: 0 %
Iron: 83 ug/dL (ref 27–139)
LDH: 208 IU/L (ref 119–226)
LDL Chol Calc (NIH): 89 mg/dL (ref 0–99)
Lymphocytes Absolute: 2.1 10*3/uL (ref 0.7–3.1)
Lymphs: 24 %
MCH: 28.8 pg (ref 26.6–33.0)
MCHC: 33.2 g/dL (ref 31.5–35.7)
MCV: 87 fL (ref 79–97)
Monocytes Absolute: 0.5 10*3/uL (ref 0.1–0.9)
Monocytes: 6 %
Neutrophils Absolute: 5.6 10*3/uL (ref 1.4–7.0)
Neutrophils: 66 %
Phosphorus: 3.4 mg/dL (ref 3.0–4.3)
Platelets: 274 10*3/uL (ref 150–450)
Potassium: 3.9 mmol/L (ref 3.5–5.2)
RBC: 4.41 x10E6/uL (ref 3.77–5.28)
RDW: 12.5 % (ref 11.7–15.4)
Sodium: 141 mmol/L (ref 134–144)
T3 Uptake Ratio: 24 % (ref 24–39)
T4, Total: 9.2 ug/dL (ref 4.5–12.0)
TSH: 4.25 u[IU]/mL (ref 0.450–4.500)
Total Protein: 6.5 g/dL (ref 6.0–8.5)
Triglycerides: 89 mg/dL (ref 0–149)
Uric Acid: 4 mg/dL (ref 2.5–7.1)
VLDL Cholesterol Cal: 17 mg/dL (ref 5–40)
WBC: 8.4 10*3/uL (ref 3.4–10.8)

## 2019-02-18 LAB — HGB A1C W/O EAG: Hgb A1c MFr Bld: 5.7 % — ABNORMAL HIGH (ref 4.8–5.6)

## 2019-02-18 LAB — VITAMIN D 25 HYDROXY (VIT D DEFICIENCY, FRACTURES): Vit D, 25-Hydroxy: 45.3 ng/mL (ref 30.0–100.0)

## 2019-02-18 NOTE — Progress Notes (Signed)
Results routed to pcp per pt request in advance of her upcoming CPE 03/04/19. Pt preferred MyChart review of results and notes and declined hard copy yesterday. She will f/u with clinic if any questions.

## 2019-03-04 ENCOUNTER — Encounter: Payer: Self-pay | Admitting: Family Medicine

## 2019-03-04 ENCOUNTER — Ambulatory Visit (INDEPENDENT_AMBULATORY_CARE_PROVIDER_SITE_OTHER): Payer: PRIVATE HEALTH INSURANCE | Admitting: Family Medicine

## 2019-03-04 ENCOUNTER — Other Ambulatory Visit: Payer: Self-pay

## 2019-03-04 VITALS — BP 130/78 | HR 79 | Temp 97.8°F | Ht 63.5 in | Wt 119.2 lb

## 2019-03-04 DIAGNOSIS — E7800 Pure hypercholesterolemia, unspecified: Secondary | ICD-10-CM

## 2019-03-04 DIAGNOSIS — Z0000 Encounter for general adult medical examination without abnormal findings: Secondary | ICD-10-CM | POA: Diagnosis not present

## 2019-03-04 DIAGNOSIS — E118 Type 2 diabetes mellitus with unspecified complications: Secondary | ICD-10-CM | POA: Diagnosis not present

## 2019-03-04 DIAGNOSIS — I10 Essential (primary) hypertension: Secondary | ICD-10-CM

## 2019-03-04 DIAGNOSIS — L900 Lichen sclerosus et atrophicus: Secondary | ICD-10-CM

## 2019-03-04 MED ORDER — ATORVASTATIN CALCIUM 10 MG PO TABS: 10.0000 mg | ORAL_TABLET | Freq: Every day | ORAL | 3 refills | Status: DC

## 2019-03-04 MED ORDER — POTASSIUM CHLORIDE ER 10 MEQ PO TBCR: 10.0000 meq | EXTENDED_RELEASE_TABLET | Freq: Every day | ORAL | 3 refills | Status: DC

## 2019-03-04 MED ORDER — METFORMIN HCL 500 MG PO TABS: ORAL_TABLET | ORAL | 3 refills | Status: DC

## 2019-03-04 MED ORDER — HYDROCHLOROTHIAZIDE 25 MG PO TABS: 25.0000 mg | ORAL_TABLET | Freq: Every day | ORAL | 3 refills | Status: DC

## 2019-03-04 MED ORDER — FEXOFENADINE HCL 180 MG PO TABS: 180.0000 mg | ORAL_TABLET | Freq: Every day | ORAL | 3 refills | Status: DC

## 2019-03-04 NOTE — Progress Notes (Signed)
Subjective:    Patient ID: Laurie Tran, female    DOB: 1946/04/19, 73 y.o.   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  Being very careful  Drinks lots of water   Feeling good  Taking care of herself  Had bad bout with kidney stones-seeing urology had stent  Continues f/u    Mammogram 9/20  Self breast exam -no lumps or changes  Mother had breast cancer   H/o lichen sclerosis-vaginal Uses the clobetasol for flare ups  Sees Dr Kennon Rounds   Tdap 1/13 zostavax 9/13  Colonoscopy 6/19   Pneumovax -up to date on prevnar and pna 23   Flu shot is utd   dexa 10/18 -bone density in nl range [place] none  Fractures- none Supplements  Takes mvi /can add vit D Exercise - recumbent bike/ elliptical/ swimming   May have to have a knee replacement in feb   bp is stable today  No cp or palpitations or headaches or edema  No side effects to medicines  BP Readings from Last 3 Encounters:  03/04/19 130/78  02/17/19 134/85  12/20/18 (!) 158/78     DM2 Lab Results  Component Value Date   HGBA1C 5.7 (H) 02/17/2019   well controlled  Eye exam 7/20  Taking metformin  On a statin  Not on ace Eats well/no sugar drinks  No results found for: Derl Barrow   Lab Results  Component Value Date   CREATININE 0.82 02/17/2019   BUN 18 02/17/2019   NA 141 02/17/2019   K 3.9 02/17/2019   CL 104 02/17/2019   CO2 26 11/26/2018   Lab Results  Component Value Date   ALT 13 02/17/2019   AST 20 02/17/2019   GGT 5 02/17/2019   ALKPHOS 119 (H) 02/17/2019   BILITOT 0.3 02/17/2019   Lab Results  Component Value Date   WBC 8.4 02/17/2019   HGB 12.7 02/17/2019   HCT 38.2 02/17/2019   MCV 87  02/17/2019   PLT 274 02/17/2019   Lab Results  Component Value Date   TSH 4.250 02/17/2019    Hyperlipidemia Lab Results  Component Value Date   CHOL 156 02/17/2019   CHOL 154 07/19/2018   CHOL 171 01/18/2018   Lab Results  Component Value Date   HDL 50 02/17/2019   HDL 55 07/19/2018   HDL 58 01/18/2018   Lab Results  Component Value Date   LDLCALC 89 02/17/2019   LDLCALC 81 07/19/2018   LDLCALC 93 01/18/2018   Lab Results  Component Value Date   TRIG 89 02/17/2019   TRIG 92 07/19/2018   TRIG 101 01/18/2018   Lab Results  Component Value Date   CHOLHDL 3.1 02/17/2019   CHOLHDL 2.8 07/19/2018   CHOLHDL 2.9 01/18/2018   No results found for: LDLDIRECT Statin and diet  Not a lot of fatty foods   Patient Active Problem List   Diagnosis Date Noted  . Ureteral calculus 11/25/2018  . Encounter for hepatitis C screening test for low risk patient 01/19/2017  . Estrogen deficiency 01/19/2017  . Lichen sclerosus 0000000  . H/O herpes zoster 11/13/2011  .  Routine general medical examination at a health care facility 05/30/2011  . Other screening mammogram 05/30/2011  . Colon polyps 05/30/2011  . Type 2 diabetes mellitus with complication, without long-term current use of insulin ([place]) 10/29/2010  . GERD (gastroesophageal reflux disease) 10/29/2010  . HTN (hypertension) 10/29/2010  . Hyperlipidemia 10/29/2010  . History of kidney stones 10/29/2010  . Perennial allergic rhinitis 10/29/2010   Past Medical History:  Diagnosis Date  . Allergic rhinitis   . Frequency of urination   . GERD (gastroesophageal reflux disease)   . Hematuria   . History of echocardiogram    in epic 12-22-2006  mild AV sclerosis without stenosis,  normal LVF, ef 55-60%,  trivial TR  . History of gastroesophageal reflux (GERD)    per pt no issues since stopped drinking soda's  . History of kidney stones   . History of nuclear stress test    in epic 09-25-2005 normal with no ischemia,  ef 69%  . Hypertension   . Nephrolithiasis    bilateral non-obstructive per CT 11-25-2018  . Nocturia   . OA (osteoarthritis)   . Right ureteral stone   . Type 2 diabetes mellitus ([place])    followed by pcp  . Urgency of urination   . Wears glasses    Past Surgical History:  Procedure Laterality Date  . BREAST SURGERY  1985   breast biopsy benign  . CYSTOSCOPY W/ URETERAL STENT PLACEMENT Right 11/25/2018   Procedure: CYSTOSCOPY WITH RETROGRADE PYELOGRAM/URETERAL STENT PLACEMENT;  Surgeon: Cleon Gustin, MD;  Location: [facility];  Service: Urology;  Laterality: Right;  . CYSTOSCOPY WITH RETROGRADE PYELOGRAM, URETEROSCOPY AND STENT PLACEMENT Left 10/21/2016   Procedure: CYSTOSCOPY WITH RETROGRADE PYELOGRAM, LEFT URETEROSCOPY PYELOSCOPY  AND LEFT STENT PLACEMENT;  Surgeon: Carolan Clines, MD;  Location: [facility];  Service: Urology;  Laterality: Left;  . CYSTOSCOPY WITH RETROGRADE PYELOGRAM, URETEROSCOPY AND STENT PLACEMENT Right 12/20/2018   Procedure: CYSTOSCOPY WITH RIGHT RETROGRADE URETEROSCOPY WITH LASER  AND STENT REPLACEMENT;  Surgeon: Cleon Gustin, MD;  Location: [facility];  Service: Urology;  Laterality: Right;  . CYSTOSCOPY/RETROGRADE/URETEROSCOPY/STONE EXTRACTION WITH BASKET  yrs ago  . EXTRACORPOREAL SHOCK WAVE LITHOTRIPSY  01-06-2011   @[facility]   . HOLMIUM LASER APPLICATION Right [id number]   Procedure: HOLMIUM LASER APPLICATION;  Surgeon: Cleon Gustin, MD;  Location: [facility];  Service: Urology;  Laterality: Right;  . KNEE ARTHROSCOPY Right 2006   Social History   Tobacco Use  . Smoking status: Never Smoker  . Smokeless tobacco: Never Used  Substance Use Topics  . Alcohol use: No    Alcohol/week: 0.0 standard drinks  . Drug use: No   Family History  Problem Relation Age of Onset  . Cancer Mother        breast CA  . Arthritis Paternal Grandmother   . Stroke Paternal Grandmother   . Diabetes Paternal Grandmother   . Cancer  Sister   . Breast cancer Sister   . Colon cancer Brother    Allergies  Allergen Reactions  . Bactrim [Sulfamethoxazole-Trimethoprim] Anaphylaxis and Shortness Of Breath    Pt reported tongue swelling with open sores, ShOB, difficulty swallowing  . Nsaids Anaphylaxis, Hives and Shortness Of Breath    Breathing problems ;  Per pt "all anti-flammatory's "  . Sulfa Antibiotics Anaphylaxis and Shortness Of Breath    Pt reports tongue swelling, ShOB, difficulty swallowing  . Celebrex [Celecoxib] Hives and Other (See Comments)    Congestion  .  Other Other (See Comments)    "needs to clear throat often with peanuts"  . Vioxx [Rofecoxib] Hives and Other (See Comments)    Congestion   Current Outpatient Medications on File Prior to Visit  Medication Sig Dispense Refill  . clobetasol ointment (TEMOVATE) 0.05 % Apply at bedtime to the affected area (Patient taking differently: Apply 1 application topically at bedtime as needed (irritation). ) 60 g 5  . EPINEPHrine 0.3 mg/0.3 mL IJ SOAJ injection Inject 0.3 mg into the muscle as needed for anaphylaxis.     . fexofenadine (ALLEGRA) 180 MG tablet TAKE 1 TABLET (180 MG TOTAL) BY MOUTH DAILY. (Patient taking differently: Take 180 mg by mouth daily. ) 90 tablet 3  . GLUCOSAMINE CHONDROITIN COMPLX PO Take by mouth.    . mometasone (NASONEX) 50 MCG/ACT nasal spray Place 2 sprays into the nose daily as needed. (Patient taking differently: Place 2 sprays into the nose daily as needed (allergies). ) 17 g 11  . Multiple Vitamins-Minerals (MULTIVITAMIN GUMMIES ADULT PO) Take 2 each by mouth daily. Vitafusion gummies    . Multiple Vitamins-Minerals (PRESERVISION AREDS 2 PO) Take 1 capsule by mouth daily.     No current facility-administered medications on file prior to visit.      Review of Systems  Constitutional: Negative for activity change, appetite change, fatigue, fever and unexpected weight change.  HENT: Negative for congestion, ear pain, rhinorrhea,  sinus pressure and sore throat.   Eyes: Negative for pain, redness and visual disturbance.  Respiratory: Negative for cough, shortness of breath and wheezing.   Cardiovascular: Negative for chest pain and palpitations.  Gastrointestinal: Negative for abdominal pain, blood in stool, constipation and diarrhea.  Endocrine: Negative for polydipsia and polyuria.  Genitourinary: Negative for dysuria, frequency and urgency.       Recent kidney stones Doing better now   Musculoskeletal: Negative for arthralgias, back pain and myalgias.  Skin: Negative for pallor and rash.  Allergic/Immunologic: Negative for environmental allergies.  Neurological: Negative for dizziness, syncope and headaches.  Hematological: Negative for adenopathy. Does not bruise/bleed easily.  Psychiatric/Behavioral: Negative for decreased concentration and dysphoric mood. The patient is not nervous/anxious.        Objective:   Physical Exam Constitutional:      General: She is not in acute distress.    Appearance: Normal appearance. She is well-developed. She is not ill-appearing or diaphoretic.  HENT:     Head: Normocephalic and atraumatic.     Right Ear: Tympanic membrane, ear canal and external ear normal.     Left Ear: Tympanic membrane, ear canal and external ear normal.     Nose: Nose normal. No congestion.     Mouth/Throat:     Mouth: Mucous membranes are moist.     Pharynx: Oropharynx is clear. No posterior oropharyngeal erythema.  Eyes:     General: No scleral icterus.    Extraocular Movements: Extraocular movements intact.     Conjunctiva/sclera: Conjunctivae normal.     Pupils: Pupils are equal, round, and reactive to light.  Neck:     Musculoskeletal: Normal range of motion and neck supple. No neck rigidity or muscular tenderness.     Thyroid: No thyromegaly.     Vascular: No carotid bruit or JVD.  Cardiovascular:     Rate and Rhythm: Normal rate and regular rhythm.     Pulses: Normal pulses.      Heart sounds: Normal heart sounds. No gallop.   Pulmonary:     Effort:  Pulmonary effort is normal. No respiratory distress.     Breath sounds: Normal breath sounds. No wheezing.     Comments: Good air exch Chest:     Chest wall: No tenderness.  Abdominal:     General: Bowel sounds are normal. There is no distension or abdominal bruit.     Palpations: Abdomen is soft. There is no mass.     Tenderness: There is no abdominal tenderness.     Hernia: No hernia is present.  Genitourinary:    Comments: Breast exam: No mass, nodules, thickening, tenderness, bulging, retraction, inflamation, nipple discharge or skin changes noted.  No axillary or clavicular LA.     Musculoskeletal: Normal range of motion.        General: No tenderness.     Right lower leg: No edema.     Left lower leg: No edema.  Lymphadenopathy:     Cervical: No cervical adenopathy.  Skin:    General: Skin is warm and dry.     Coloration: Skin is not pale.     Findings: No erythema or rash.     Comments: Solar lentigines diffusely   Neurological:     Mental Status: She is alert. Mental status is at baseline.     Cranial Nerves: No cranial nerve deficit.     Motor: No abnormal muscle tone.     Coordination: Coordination normal.     Gait: Gait normal.     Deep Tendon Reflexes: Reflexes are normal and symmetric.  Psychiatric:        Mood and Affect: Mood normal.        Cognition and Memory: Cognition and memory normal.     Comments: Pleasant            Assessment & Plan:   Problem List Items Addressed This Visit      Cardiovascular and Mediastinum   HTN (hypertension)    bp in fair control at this time  BP Readings from Last 1 Encounters:  03/04/19 130/78   No changes needed Most recent labs reviewed  Disc lifstyle change with low sodium diet and exercise        Relevant Medications   atorvastatin (LIPITOR) 10 MG tablet   hydrochlorothiazide (HYDRODIURIL) 25 MG tablet     Endocrine   Type 2  diabetes mellitus with complication, without long-term current use of insulin ([facility])    Lab Results  Component Value Date   HGBA1C 5.7 (H) 02/17/2019   Very well controlled  utd eye exam  Taking metformin and statin  No ace due to past h/o anaphylaxis with another drug        Relevant Medications   metFORMIN (GLUCOPHAGE) 500 MG tablet   atorvastatin (LIPITOR) 10 MG tablet     Musculoskeletal and Integument   Lichen sclerosus    Continues f/u with Dr Kennon Rounds and prn clobetasol        Other   Hyperlipidemia    Disc goals for lipids and reasons to control them Rev last labs with pt Rev low sat fat diet in detail Well controlled with atorvastatin and diet  Good HDL LDL in 80s       Relevant Medications   atorvastatin (LIPITOR) 10 MG tablet   hydrochlorothiazide (HYDRODIURIL) 25 MG tablet   Routine general medical examination at a health care facility - Primary    Reviewed health habits including diet and exercise and skin cancer prevention Reviewed appropriate screening tests for age  Also reviewed health  mt list, fam hx and immunization status , as well as social and family history   See HPI Labs reviewed  Up to date on health mt  Encouraged exercise and good self care

## 2019-03-04 NOTE — Patient Instructions (Addendum)
If you are interested in the new shingles vaccine (Shingrix) - call your local pharmacy to check on coverage and availability  If affordable, get on a wait list at your pharmacy to get the vaccine.  Try to get 1200-1500 mg of calcium per day with at least 1000 iu of vitamin D - for bone health  Vitamin D is more important   Keep taking good care of yourself  Stay active

## 2019-03-06 NOTE — Assessment & Plan Note (Signed)
Disc goals for lipids and reasons to control them Rev last labs with pt Rev low sat fat diet in detail Well controlled with atorvastatin and diet  Good HDL LDL in 80s

## 2019-03-06 NOTE — Assessment & Plan Note (Addendum)
Reviewed health habits including diet and exercise and skin cancer prevention Reviewed appropriate screening tests for age  Also reviewed health mt list, fam hx and immunization status , as well as social and family history   See HPI Labs reviewed  Up to date on health mt  Encouraged exercise and good self care

## 2019-03-06 NOTE — Assessment & Plan Note (Signed)
Continues f/u with Dr Kennon Rounds and prn clobetasol

## 2019-03-06 NOTE — Assessment & Plan Note (Addendum)
Lab Results  Component Value Date   HGBA1C 5.7 (H) 02/17/2019   Very well controlled  utd eye exam  Taking metformin and statin  No ace due to past h/o anaphylaxis with another drug

## 2019-03-06 NOTE — Assessment & Plan Note (Signed)
bp in fair control at this time  BP Readings from Last 1 Encounters:  03/04/19 130/78   No changes needed Most recent labs reviewed  Disc lifstyle change with low sodium diet and exercise

## 2019-06-13 ENCOUNTER — Ambulatory Visit (INDEPENDENT_AMBULATORY_CARE_PROVIDER_SITE_OTHER)
Admission: RE | Admit: 2019-06-13 | Discharge: 2019-06-13 | Disposition: A | Discharge status: Home / Self Care | Payer: PRIVATE HEALTH INSURANCE | Source: Ambulatory Visit | Attending: Family Medicine | Admitting: Family Medicine

## 2019-06-13 ENCOUNTER — Other Ambulatory Visit: Payer: Self-pay

## 2019-06-13 ENCOUNTER — Ambulatory Visit: Payer: PRIVATE HEALTH INSURANCE | Admitting: Family Medicine

## 2019-06-13 ENCOUNTER — Encounter: Payer: Self-pay | Admitting: Family Medicine

## 2019-06-13 VITALS — BP 130/84 | HR 90 | Temp 97.1°F | Ht 63.5 in | Wt 122.1 lb

## 2019-06-13 DIAGNOSIS — E118 Type 2 diabetes mellitus with unspecified complications: Secondary | ICD-10-CM | POA: Diagnosis not present

## 2019-06-13 DIAGNOSIS — M542 Cervicalgia: Secondary | ICD-10-CM

## 2019-06-13 MED ORDER — METHOCARBAMOL 500 MG PO TABS: 500.0000 mg | ORAL_TABLET | Freq: Every evening | ORAL | 0 refills | Status: DC | PRN

## 2019-06-13 NOTE — Patient Instructions (Signed)
Continue heat on and off  Also gentle stretching   The muscle relaxer (generic robaxin) is ok to try at night - caution of sedation   Xray now  We will contact you with the result   If worse or no further improvement in a week please let us know

## 2019-06-13 NOTE — Assessment & Plan Note (Signed)
Well controlled 
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
## 2019-06-15 ENCOUNTER — Telehealth: Payer: Self-pay | Admitting: Family Medicine

## 2019-06-15 NOTE — Telephone Encounter (Signed)
error 

## 2019-07-07 ENCOUNTER — Telehealth: Payer: Self-pay | Admitting: *Deleted
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
## 2019-07-10 ENCOUNTER — Ambulatory Visit: Payer: PRIVATE HEALTH INSURANCE | Attending: Internal Medicine

## 2019-07-10 DIAGNOSIS — Z23 Encounter for immunization: Secondary | ICD-10-CM

## 2019-07-10 NOTE — Progress Notes (Signed)
   Covid-19 Vaccination Clinic  Name:  Laurie Tran    MRN: [id number] DOB: 1945-06-26  07/10/2019  [name] was observed post Covid-19 immunization for 30 minutes based on pre-vaccination screening without incidence. She was provided with Vaccine Information Sheet and instruction to access the V-Safe system.   [name] was instructed to call 911 with any severe reactions post vaccine: Marland Kitchen Difficulty breathing  . Swelling of your face and throat  . A fast heartbeat  . A bad rash all over your body  . Dizziness and weakness    Immunizations Administered    Name Date Dose VIS Date Route   Pfizer COVID-19 Vaccine 07/10/2019  9:50 AM 0.3 mL 05/06/2019 Intramuscular   Manufacturer: [place]   Lot: [id number]   [place]: [id number]

## 2019-08-02 ENCOUNTER — Ambulatory Visit: Payer: PRIVATE HEALTH INSURANCE | Attending: Internal Medicine

## 2019-08-02 DIAGNOSIS — Z23 Encounter for immunization: Secondary | ICD-10-CM

## 2019-08-02 NOTE — Progress Notes (Signed)
   Covid-19 Vaccination Clinic  Name:  Laurie Tran    MRN: [id number] DOB: Jun 18, 1945  08/02/2019  [name] was observed post Covid-19 immunization for 15 minutes without incident. She was provided with Vaccine Information Sheet and instruction to access the V-Safe system.   [name] was instructed to call 911 with any severe reactions post vaccine: Marland Kitchen Difficulty breathing  . Swelling of face and throat  . A fast heartbeat  . A bad rash all over body  . Dizziness and weakness   Immunizations Administered    Name Date Dose VIS Date Route   Pfizer COVID-19 Vaccine 08/02/2019  6:03 PM 0.3 mL 05/06/2019 Intramuscular   Manufacturer: [place]   Lot: [id number]   [place]: [id number]

## 2019-08-08 ENCOUNTER — Other Ambulatory Visit: Payer: Self-pay | Admitting: Urology

## 2019-08-08 ENCOUNTER — Other Ambulatory Visit (HOSPITAL_COMMUNITY)
Admission: RE | Admit: 2019-08-08 | Discharge: 2019-08-08 | Disposition: A | Discharge status: Home / Self Care | Payer: PRIVATE HEALTH INSURANCE | Source: Ambulatory Visit | Attending: Urology | Admitting: Urology

## 2019-08-08 DIAGNOSIS — Z20822 Contact with and (suspected) exposure to covid-19: Secondary | ICD-10-CM | POA: Insufficient documentation

## 2019-08-08 DIAGNOSIS — Z01812 Encounter for preprocedural laboratory examination: Secondary | ICD-10-CM | POA: Diagnosis not present

## 2019-08-09 LAB — SARS CORONAVIRUS 2 (TAT 6-24 HRS): SARS Coronavirus 2: NEGATIVE

## 2019-08-09 NOTE — Progress Notes (Signed)
talked with pt via phone. Instructions given for procedure. Verbalized understanding.

## 2019-08-11 ENCOUNTER — Encounter (HOSPITAL_BASED_OUTPATIENT_CLINIC_OR_DEPARTMENT_OTHER): Payer: Self-pay | Admitting: Urology

## 2019-08-11 ENCOUNTER — Ambulatory Visit (HOSPITAL_COMMUNITY): Payer: PRIVATE HEALTH INSURANCE

## 2019-08-11 ENCOUNTER — Ambulatory Visit (HOSPITAL_BASED_OUTPATIENT_CLINIC_OR_DEPARTMENT_OTHER)
Admission: RE | Admit: 2019-08-11 | Discharge: 2019-08-11 | Disposition: A | Discharge status: Home / Self Care | Payer: PRIVATE HEALTH INSURANCE | Attending: Urology | Admitting: Urology

## 2019-08-11 ENCOUNTER — Encounter (HOSPITAL_BASED_OUTPATIENT_CLINIC_OR_DEPARTMENT_OTHER)
Admission: RE | Disposition: A | Discharge status: Home / Self Care | Payer: Self-pay | Source: Home / Self Care | Attending: Urology

## 2019-08-11 DIAGNOSIS — Z882 Allergy status to sulfonamides status: Secondary | ICD-10-CM | POA: Diagnosis not present

## 2019-08-11 DIAGNOSIS — E119 Type 2 diabetes mellitus without complications: Secondary | ICD-10-CM | POA: Diagnosis not present

## 2019-08-11 DIAGNOSIS — N200 Calculus of kidney: Secondary | ICD-10-CM | POA: Insufficient documentation

## 2019-08-11 DIAGNOSIS — Z881 Allergy status to other antibiotic agents status: Secondary | ICD-10-CM | POA: Insufficient documentation

## 2019-08-11 DIAGNOSIS — Z87442 Personal history of urinary calculi: Secondary | ICD-10-CM | POA: Insufficient documentation

## 2019-08-11 DIAGNOSIS — Z886 Allergy status to analgesic agent status: Secondary | ICD-10-CM | POA: Insufficient documentation

## 2019-08-11 HISTORY — PX: EXTRACORPOREAL SHOCK WAVE LITHOTRIPSY: SHX1557

## 2019-08-11 LAB — GLUCOSE, CAPILLARY
Glucose-Capillary: 76 mg/dL (ref 70–99)
Glucose-Capillary: 161 mg/dL — ABNORMAL HIGH (ref 70–99)

## 2019-08-11 SURGERY — LITHOTRIPSY, ESWL
Anesthesia: LOCAL | Laterality: Right

## 2019-08-11 MED ORDER — SENNOSIDES-DOCUSATE SODIUM 8.6-50 MG PO TABS: 1.0000 | ORAL_TABLET | Freq: Two times a day (BID) | ORAL | 0 refills | Status: DC

## 2019-08-11 MED ORDER — SODIUM CHLORIDE 0.9 % IV SOLN
INTRAVENOUS | Status: DC
  Filled 2019-08-11: qty 1000

## 2019-08-11 MED ORDER — DIPHENHYDRAMINE HCL 25 MG PO CAPS
ORAL_CAPSULE | ORAL | Status: AC
  Filled 2019-08-11: qty 1

## 2019-08-11 MED ORDER — DIAZEPAM 5 MG PO TABS
10.0000 mg | ORAL_TABLET | ORAL | Status: AC
  Administered 2019-08-11: 10 mg via ORAL
  Filled 2019-08-11: qty 2

## 2019-08-11 MED ORDER — DIAZEPAM 5 MG PO TABS
ORAL_TABLET | ORAL | Status: AC
  Filled 2019-08-11: qty 2

## 2019-08-11 MED ORDER — CIPROFLOXACIN HCL 500 MG PO TABS
500.0000 mg | ORAL_TABLET | ORAL | Status: AC
  Administered 2019-08-11: 09:00:00 500 mg via ORAL
  Filled 2019-08-11: qty 1

## 2019-08-11 MED ORDER — OXYCODONE-ACETAMINOPHEN 5-325 MG PO TABS: 1.0000 | ORAL_TABLET | Freq: Four times a day (QID) | ORAL | 0 refills | Status: DC | PRN

## 2019-08-11 MED ORDER — DIPHENHYDRAMINE HCL 25 MG PO CAPS
25.0000 mg | ORAL_CAPSULE | ORAL | Status: AC
  Administered 2019-08-11: 25 mg via ORAL
  Filled 2019-08-11: qty 1

## 2019-08-11 MED ORDER — DEXTROSE-NACL 5-0.45 % IV SOLN
INTRAVENOUS | Status: DC
  Filled 2019-08-11: qty 1000

## 2019-08-11 MED ORDER — SILODOSIN 8 MG PO CAPS: 8.0000 mg | ORAL_CAPSULE | Freq: Every day | ORAL | 0 refills | Status: DC

## 2019-08-11 MED ORDER — CIPROFLOXACIN HCL 500 MG PO TABS
ORAL_TABLET | ORAL | Status: AC
  Filled 2019-08-11: qty 1

## 2019-08-11 NOTE — Discharge Instructions (Signed)
1 - You may have urinary urgency (bladder spasms), pass small stone fragments, and bloody urine on / off for up to 2 weeks. This is normal. ° °2 - Call MD or go to ER for fever >102, severe pain / nausea / vomiting not relieved by medications, or acute change in medical status ° °

## 2019-08-11 NOTE — H&P (Signed)
Laurie Tran is an 74 y.o. female.    Chief Complaint: Pre-Op RIGHT Shockwave Lithotripsy  HPI:   1 - RIGHT UPJ Stone - 69mm Rt UPJ stone by KUB, RUS 07/2019. UA without infecitous parameters, C19 screen negative. Scattered left upper / mid non-obstructing stones as well.    Today "Laurie Tran" is sen to proceed with RIGHT shockwave lithotripsy for large Rt UPJ sotne. NO interval fevers. NO anticoagulants.   Past Medical History:  Diagnosis Date  . Allergic rhinitis   . Frequency of urination   . GERD (gastroesophageal reflux disease)   . Hematuria   . History of echocardiogram    in epic 12-22-2006  mild AV sclerosis without stenosis,  normal LVF, ef 55-60%,  trivial TR  . History of gastroesophageal reflux (GERD)    per pt no issues since stopped drinking soda's  . History of kidney stones   . History of nuclear stress test    in epic 09-25-2005 normal with no ischemia, ef 69%  . Hypertension   . Nephrolithiasis    bilateral non-obstructive per CT 11-25-2018  . Nocturia   . OA (osteoarthritis)   . Right ureteral stone   . Type 2 diabetes mellitus ([place])    followed by pcp  . Urgency of urination   . Wears glasses     Past Surgical History:  Procedure Laterality Date  . BREAST SURGERY  1985   breast biopsy benign  . CYSTOSCOPY W/ URETERAL STENT PLACEMENT Right 11/25/2018   Procedure: CYSTOSCOPY WITH RETROGRADE PYELOGRAM/URETERAL STENT PLACEMENT;  Surgeon: Cleon Gustin, MD;  Location: [facility];  Service: Urology;  Laterality: Right;  . CYSTOSCOPY WITH RETROGRADE PYELOGRAM, URETEROSCOPY AND STENT PLACEMENT Left 10/21/2016   Procedure: CYSTOSCOPY WITH RETROGRADE PYELOGRAM, LEFT URETEROSCOPY PYELOSCOPY  AND LEFT STENT PLACEMENT;  Surgeon: Carolan Clines, MD;  Location: [facility];  Service: Urology;  Laterality: Left;  . CYSTOSCOPY WITH RETROGRADE PYELOGRAM, URETEROSCOPY AND STENT PLACEMENT Right 12/20/2018   Procedure: CYSTOSCOPY WITH RIGHT RETROGRADE URETEROSCOPY WITH LASER   AND STENT REPLACEMENT;  Surgeon: Cleon Gustin, MD;  [facility];  Service: Urology;  Laterality: Right;  . CYSTOSCOPY/RETROGRADE/URETEROSCOPY/STONE EXTRACTION WITH BASKET  yrs ago  . EXTRACORPOREAL SHOCK WAVE LITHOTRIPSY  01-06-2011   @[facility]   . HOLMIUM LASER APPLICATION Right [id number]   Procedure: HOLMIUM LASER APPLICATION;  Surgeon: Cleon Gustin, MD;  Location: [facility];  Service: Urology;  Laterality: Right;  . KNEE ARTHROSCOPY Right 2006    Family History  Problem Relation Age of Onset  . Cancer Mother        breast CA  . Arthritis Paternal Grandmother   . Stroke Paternal Grandmother   . Diabetes Paternal Grandmother   . Cancer Sister   . Breast cancer Sister   . Colon cancer Brother    Social History:  reports that she has never smoked. She has never used smokeless tobacco. She reports that she does not drink alcohol or use drugs.  Allergies:  Allergies  Allergen Reactions  . Bactrim [Sulfamethoxazole-Trimethoprim] Anaphylaxis and Shortness Of Breath    Pt reported tongue swelling with open sores, ShOB, difficulty swallowing  . Nsaids Anaphylaxis, Hives and Shortness Of Breath    Breathing problems ;  Per pt "all anti-flammatory's "  . Sulfa Antibiotics Anaphylaxis and Shortness Of Breath    Pt reports tongue swelling, ShOB, difficulty swallowing  . Celebrex [Celecoxib] Hives and Other (See Comments)    Congestion  . Other Other (See  Comments)    "needs to clear throat often with peanuts"  . Vioxx [Rofecoxib] Hives and Other (See Comments)    Congestion    No medications prior to admission.    No results found for this or any previous visit (from the past 48 hour(s)). No results found.  Review of Systems  Constitutional: Negative for chills and fever.  Genitourinary: Positive for flank pain.  All other systems reviewed and are negative.   There were no vitals taken for this visit. Physical Exam   Constitutional: She appears well-developed.  HENT:  Head: Normocephalic.  Eyes: Pupils are equal, round, and reactive to light.  Cardiovascular: Normal rate.  Respiratory: Effort normal.  GI: Soft.  Genitourinary:    Genitourinary Comments: Mild Rt CVAT at present.    Musculoskeletal:        General: Normal range of motion.     Cervical back: Normal range of motion.  Neurological: She is alert.  Skin: Skin is warm.  Psychiatric: She has a normal mood and affect.     Assessment/Plan  Proceed as planned with RIGHT shockwave lithotripsy. Risks, benefits, alternatives, expected peri-procedure course discussed previously and reiterated today.   Alexis Frock, MD 08/11/2019, 7:17 AM

## 2019-08-11 NOTE — Brief Op Note (Signed)
08/11/2019  10:56 AM  PATIENT:  Duane Lope  74 y.o. female  PRE-OPERATIVE DIAGNOSIS:  RIGHT RENAL CALCULUS  POST-OPERATIVE DIAGNOSIS:  * No post-op diagnosis entered *  PROCEDURE:  Procedure(s): EXTRACORPOREAL SHOCK WAVE LITHOTRIPSY ([facility]) (Right)  SURGEON:  Surgeon(s) and Role:    * Alexis Frock, MD - Primary  PHYSICIAN ASSISTANT:   ASSISTANTS: none   ANESTHESIA:   MAC  EBL:  minimal   BLOOD ADMINISTERED:none  DRAINS: none   LOCAL MEDICATIONS USED:  NONE  SPECIMEN:  No Specimen  DISPOSITION OF SPECIMEN:  N/A  COUNTS:  YES  TOURNIQUET:  * No tourniquets in log *  DICTATION: .Note written in paper chart  PLAN OF CARE: Discharge to home after PACU  PATIENT DISPOSITION:  Short Stay   Delay start of Pharmacological VTE agent (>24hrs) due to surgical blood loss or risk of bleeding: not applicable

## 2019-08-11 NOTE — Progress Notes (Signed)
blood sugar 76. Dr. Tresa Moore aware order to change IV fluids from Normal Saline to D5 1/2 NS . Will recheck blood sugar in 15 mins

## 2019-12-13 ENCOUNTER — Encounter: Payer: Self-pay | Admitting: Family Medicine

## 2019-12-13 LAB — HM DIABETES EYE EXAM

## 2019-12-26 ENCOUNTER — Telehealth: Payer: Self-pay | Admitting: Family Medicine

## 2019-12-26 DIAGNOSIS — H3581 Retinal edema: Secondary | ICD-10-CM | POA: Insufficient documentation

## 2019-12-26 DIAGNOSIS — H579 Unspecified disorder of eye and adnexa: Secondary | ICD-10-CM | POA: Insufficient documentation

## 2019-12-26 DIAGNOSIS — E7800 Pure hypercholesterolemia, unspecified: Secondary | ICD-10-CM

## 2019-12-26 NOTE — Telephone Encounter (Signed)
Referral done Will route to [facility]  

## 2019-12-26 NOTE — Telephone Encounter (Signed)
Pt notified of Dr. Glori Bickers Dr. Bary Leriche comments and agrees with the carotid doppler test. Pt lives in [place] so she said she would like to get it done close by here either [place] or [place] is fine whichever is closer to our office. Pt advise Dr. Glori Bickers will put order in and [facility] will call to schedule appt

## 2019-12-26 NOTE — Telephone Encounter (Signed)
Please let pt know that I rec a note from her eye doctor Dr Nicki Reaper who recommends that we check a carotid doppler because of a change on retina that can be a sign of ischemia (lack of blood flow) This is to assess circulation   Of note-this spot may also be from her diabetes , but he wanted to assess circulation as well   Let me know if open to this and what area she prefers Thanks

## 2019-12-27 ENCOUNTER — Ambulatory Visit: Payer: PRIVATE HEALTH INSURANCE | Admitting: Family Medicine

## 2019-12-27 NOTE — Telephone Encounter (Signed)
appt scheduled. Pt aware.

## 2020-01-20 ENCOUNTER — Other Ambulatory Visit: Payer: Self-pay

## 2020-01-20 ENCOUNTER — Ambulatory Visit (HOSPITAL_COMMUNITY)
Admission: RE | Admit: 2020-01-20 | Discharge: 2020-01-20 | Disposition: A | Discharge status: Home / Self Care | Payer: PRIVATE HEALTH INSURANCE | Source: Ambulatory Visit | Attending: Internal Medicine | Admitting: Internal Medicine

## 2020-01-20 DIAGNOSIS — E7800 Pure hypercholesterolemia, unspecified: Secondary | ICD-10-CM | POA: Diagnosis not present

## 2020-01-20 DIAGNOSIS — I10 Essential (primary) hypertension: Secondary | ICD-10-CM | POA: Insufficient documentation

## 2020-01-20 DIAGNOSIS — H579 Unspecified disorder of eye and adnexa: Secondary | ICD-10-CM | POA: Insufficient documentation

## 2020-01-20 DIAGNOSIS — H3581 Retinal edema: Secondary | ICD-10-CM

## 2020-01-20 DIAGNOSIS — E119 Type 2 diabetes mellitus without complications: Secondary | ICD-10-CM | POA: Diagnosis not present

## 2020-01-20 DIAGNOSIS — E785 Hyperlipidemia, unspecified: Secondary | ICD-10-CM | POA: Insufficient documentation

## 2020-02-13 ENCOUNTER — Other Ambulatory Visit: Payer: Self-pay | Admitting: Urology

## 2020-02-21 ENCOUNTER — Other Ambulatory Visit: Payer: Self-pay | Admitting: Family Medicine

## 2020-02-21 NOTE — Telephone Encounter (Signed)
Please schedule fall or winter PE and refill until then 

## 2020-02-21 NOTE — Telephone Encounter (Signed)
Last CPE was on 03/04/19, no future appts., please advise

## 2020-02-22 NOTE — Telephone Encounter (Signed)
Med refilled once and Laurie Tran will reach out to try and get appt scheduled  

## 2020-02-24 ENCOUNTER — Other Ambulatory Visit: Payer: Self-pay

## 2020-02-24 ENCOUNTER — Ambulatory Visit: Payer: Self-pay | Admitting: *Deleted

## 2020-02-24 DIAGNOSIS — Z0000 Encounter for general adult medical examination without abnormal findings: Secondary | ICD-10-CM

## 2020-02-24 NOTE — Progress Notes (Signed)
Annual labs ahead of CPE with PCP on 10/13. PCP CC'd on results.

## 2020-02-25 LAB — CMP12+LP+TP+TSH+6AC+CBC/D/PLT
ALT: 19 IU/L (ref 0–32)
AST: 20 IU/L (ref 0–40)
Albumin/Globulin Ratio: 2.2 (ref 1.2–2.2)
Albumin: 4.7 g/dL (ref 3.7–4.7)
Alkaline Phosphatase: 114 IU/L (ref 44–121)
BUN/Creatinine Ratio: 21 (ref 12–28)
BUN: 17 mg/dL (ref 8–27)
Basophils Absolute: 0 10*3/uL (ref 0.0–0.2)
Basos: 1 %
Bilirubin Total: 0.4 mg/dL (ref 0.0–1.2)
Calcium: 9.7 mg/dL (ref 8.7–10.3)
Chloride: 101 mmol/L (ref 96–106)
Chol/HDL Ratio: 2.9 ratio (ref 0.0–4.4)
Cholesterol, Total: 159 mg/dL (ref 100–199)
Creatinine, Ser: 0.8 mg/dL (ref 0.57–1.00)
EOS (ABSOLUTE): 0.2 10*3/uL (ref 0.0–0.4)
Eos: 2 %
Estimated CHD Risk: <0.5 times avg. (ref 0.0–1.0)
Free Thyroxine Index: 2.5 (ref 1.2–4.9)
GFR calc Af Amer: 85 mL/min/{1.73_m2} (ref >59)
GFR calc non Af Amer: 74 mL/min/{1.73_m2} (ref >59)
GGT: 4 IU/L (ref 0–60)
Globulin, Total: 2.1 g/dL (ref 1.5–4.5)
Glucose: 93 mg/dL (ref 65–99)
HDL: 55 mg/dL (ref >39)
Hematocrit: 37.7 % (ref 34.0–46.6)
Hemoglobin: 12.7 g/dL (ref 11.1–15.9)
Immature Grans (Abs): 0 10*3/uL (ref 0.0–0.1)
Immature Granulocytes: 0 %
Iron: 66 ug/dL (ref 27–139)
LDH: 218 IU/L (ref 119–226)
LDL Chol Calc (NIH): 89 mg/dL (ref 0–99)
Lymphocytes Absolute: 2.3 10*3/uL (ref 0.7–3.1)
Lymphs: 28 %
MCH: 29 pg (ref 26.6–33.0)
MCHC: 33.7 g/dL (ref 31.5–35.7)
MCV: 86 fL (ref 79–97)
Monocytes Absolute: 0.4 10*3/uL (ref 0.1–0.9)
Monocytes: 6 %
Neutrophils Absolute: 5.1 10*3/uL (ref 1.4–7.0)
Neutrophils: 63 %
Phosphorus: 3.4 mg/dL (ref 3.0–4.3)
Platelets: 266 10*3/uL (ref 150–450)
Potassium: 3.8 mmol/L (ref 3.5–5.2)
RBC: 4.38 x10E6/uL (ref 3.77–5.28)
RDW: 12.1 % (ref 11.7–15.4)
Sodium: 142 mmol/L (ref 134–144)
T3 Uptake Ratio: 27 % (ref 24–39)
T4, Total: 9.2 ug/dL (ref 4.5–12.0)
TSH: 2.9 u[IU]/mL (ref 0.450–4.500)
Total Protein: 6.8 g/dL (ref 6.0–8.5)
Triglycerides: 76 mg/dL (ref 0–149)
Uric Acid: 4.7 mg/dL (ref 3.1–7.9)
VLDL Cholesterol Cal: 15 mg/dL (ref 5–40)
WBC: 8.1 10*3/uL (ref 3.4–10.8)

## 2020-02-25 LAB — VITAMIN D 25 HYDROXY (VIT D DEFICIENCY, FRACTURES): Vit D, 25-Hydroxy: 48.3 ng/mL (ref 30.0–100.0)

## 2020-02-25 LAB — HGB A1C W/O EAG: Hgb A1c MFr Bld: 6 % — ABNORMAL HIGH (ref 4.8–5.6)

## 2020-02-27 ENCOUNTER — Other Ambulatory Visit (HOSPITAL_COMMUNITY)
Admission: RE | Admit: 2020-02-27 | Discharge: 2020-02-27 | Disposition: A | Discharge status: Home / Self Care | Payer: PRIVATE HEALTH INSURANCE | Source: Ambulatory Visit | Attending: Urology | Admitting: Urology

## 2020-02-27 DIAGNOSIS — Z20822 Contact with and (suspected) exposure to covid-19: Secondary | ICD-10-CM | POA: Insufficient documentation

## 2020-02-27 DIAGNOSIS — Z01812 Encounter for preprocedural laboratory examination: Secondary | ICD-10-CM | POA: Insufficient documentation

## 2020-02-27 LAB — SARS CORONAVIRUS 2 (TAT 6-24 HRS): SARS Coronavirus 2: NEGATIVE

## 2020-02-27 NOTE — Progress Notes (Signed)
Results reviewed with pt per NP notes above. Pt does report being off vitamins for past few weeks for upcoming surgery. Questions if reduced iron could be from that. Advised her that is possible if she is taking a multivitamin with iron in it.  PCP was CC'd on results originally. No further questions/concerns.

## 2020-02-28 NOTE — Progress Notes (Signed)
Noted agree decreased iron level could be related to stopping vitamins recently.  Reviewed RN Hildred Alamin result note.

## 2020-02-29 ENCOUNTER — Encounter (HOSPITAL_BASED_OUTPATIENT_CLINIC_OR_DEPARTMENT_OTHER): Payer: Self-pay | Admitting: Urology
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
## 2020-03-01 ENCOUNTER — Ambulatory Visit (HOSPITAL_BASED_OUTPATIENT_CLINIC_OR_DEPARTMENT_OTHER)
Admission: RE | Admit: 2020-03-01 | Discharge: 2020-03-01 | Disposition: A | Discharge status: Home / Self Care | Payer: PRIVATE HEALTH INSURANCE | Attending: Urology | Admitting: Urology

## 2020-03-01 ENCOUNTER — Ambulatory Visit (HOSPITAL_COMMUNITY): Payer: PRIVATE HEALTH INSURANCE

## 2020-03-01 ENCOUNTER — Encounter (HOSPITAL_BASED_OUTPATIENT_CLINIC_OR_DEPARTMENT_OTHER): Payer: Self-pay | Admitting: Urology

## 2020-03-01 ENCOUNTER — Other Ambulatory Visit: Payer: Self-pay

## 2020-03-01 ENCOUNTER — Encounter (HOSPITAL_BASED_OUTPATIENT_CLINIC_OR_DEPARTMENT_OTHER)
Admission: RE | Disposition: A | Discharge status: Home / Self Care | Payer: Self-pay | Source: Home / Self Care | Attending: Urology

## 2020-03-01 DIAGNOSIS — E119 Type 2 diabetes mellitus without complications: Secondary | ICD-10-CM | POA: Insufficient documentation

## 2020-03-01 DIAGNOSIS — N200 Calculus of kidney: Secondary | ICD-10-CM | POA: Diagnosis present

## 2020-03-01 DIAGNOSIS — Z79899 Other long term (current) drug therapy: Secondary | ICD-10-CM | POA: Diagnosis not present

## 2020-03-01 DIAGNOSIS — I10 Essential (primary) hypertension: Secondary | ICD-10-CM | POA: Insufficient documentation

## 2020-03-01 DIAGNOSIS — Z7984 Long term (current) use of oral hypoglycemic drugs: Secondary | ICD-10-CM | POA: Diagnosis not present

## 2020-03-01 HISTORY — PX: EXTRACORPOREAL SHOCK WAVE LITHOTRIPSY: SHX1557

## 2020-03-01 LAB — GLUCOSE, CAPILLARY
Glucose-Capillary: 85 mg/dL (ref 70–99)
Glucose-Capillary: 76 mg/dL (ref 70–99)

## 2020-03-01 SURGERY — LITHOTRIPSY, ESWL
Anesthesia: LOCAL | Laterality: Left

## 2020-03-01 MED ORDER — DIAZEPAM 5 MG PO TABS
10.0000 mg | ORAL_TABLET | ORAL | Status: AC
  Administered 2020-03-01: 10 mg via ORAL

## 2020-03-01 MED ORDER — SODIUM CHLORIDE 0.9 % IV SOLN: INTRAVENOUS | Status: DC

## 2020-03-01 MED ORDER — HYDROCODONE-ACETAMINOPHEN 5-325 MG PO TABS
1.0000 | ORAL_TABLET | ORAL | 0 refills | Status: AC | PRN
Start: 2020-03-01 — End: 2021-03-01

## 2020-03-01 MED ORDER — DIPHENHYDRAMINE HCL 25 MG PO CAPS
ORAL_CAPSULE | ORAL | Status: AC
  Filled 2020-03-01: qty 1

## 2020-03-01 MED ORDER — ALFUZOSIN HCL ER 10 MG PO TB24: 10.0000 mg | ORAL_TABLET | Freq: Every day | ORAL | 0 refills | Status: DC

## 2020-03-01 MED ORDER — DIAZEPAM 5 MG PO TABS
ORAL_TABLET | ORAL | Status: AC
  Filled 2020-03-01: qty 2

## 2020-03-01 MED ORDER — DIPHENHYDRAMINE HCL 25 MG PO CAPS
25.0000 mg | ORAL_CAPSULE | ORAL | Status: AC
  Administered 2020-03-01: 25 mg via ORAL

## 2020-03-01 NOTE — H&P (Signed)
See HP scanned into chart 

## 2020-03-01 NOTE — Discharge Instructions (Signed)
See [facility] discharge instructions in chart.    Post Anesthesia Home Care Instructions  Activity: Get plenty of rest for the remainder of the day. A responsible individual must stay with you for 24 hours following the procedure.  For the next 24 hours, DO NOT: -Drive a car -Operate machinery -Drink alcoholic beverages -Take any medication unless instructed by your physician -Make any legal decisions or sign important papers.  Meals: Start with liquid foods such as gelatin or soup. Progress to regular foods as tolerated. Avoid greasy, spicy, heavy foods. If nausea and/or vomiting occur, drink only clear liquids until the nausea and/or vomiting subsides. Call your physician if vomiting continues.      

## 2020-03-01 NOTE — Op Note (Signed)
See [facility] OP note scanned into chart. Also because of the size, density, location and other factors that cannot be anticipated I feel this will likely be a staged procedure. This fact supersedes any indication in the scanned [facility] operative note to the contrary.  

## 2020-03-02 ENCOUNTER — Encounter (HOSPITAL_BASED_OUTPATIENT_CLINIC_OR_DEPARTMENT_OTHER): Payer: Self-pay | Admitting: Urology

## 2020-03-07 ENCOUNTER — Encounter: Payer: Self-pay | Admitting: Family Medicine

## 2020-03-07 ENCOUNTER — Other Ambulatory Visit: Payer: Self-pay

## 2020-03-07 ENCOUNTER — Ambulatory Visit (INDEPENDENT_AMBULATORY_CARE_PROVIDER_SITE_OTHER): Payer: PRIVATE HEALTH INSURANCE | Admitting: Family Medicine

## 2020-03-07 VITALS — BP 138/78 | HR 78 | Temp 97.1°F | Ht 63.75 in | Wt 132.2 lb

## 2020-03-07 DIAGNOSIS — E1169 Type 2 diabetes mellitus with other specified complication: Secondary | ICD-10-CM | POA: Diagnosis not present

## 2020-03-07 DIAGNOSIS — Z23 Encounter for immunization: Secondary | ICD-10-CM

## 2020-03-07 DIAGNOSIS — E118 Type 2 diabetes mellitus with unspecified complications: Secondary | ICD-10-CM | POA: Diagnosis not present

## 2020-03-07 DIAGNOSIS — Z0000 Encounter for general adult medical examination without abnormal findings: Secondary | ICD-10-CM | POA: Diagnosis not present

## 2020-03-07 DIAGNOSIS — H3581 Retinal edema: Secondary | ICD-10-CM

## 2020-03-07 DIAGNOSIS — I10 Essential (primary) hypertension: Secondary | ICD-10-CM

## 2020-03-07 DIAGNOSIS — E785 Hyperlipidemia, unspecified: Secondary | ICD-10-CM

## 2020-03-07 MED ORDER — METFORMIN HCL 500 MG PO TABS
ORAL_TABLET | ORAL | 3 refills | Status: DC
Start: 2020-03-07 — End: 2021-02-19

## 2020-03-07 MED ORDER — HYDROCHLOROTHIAZIDE 25 MG PO TABS
25.0000 mg | ORAL_TABLET | Freq: Every day | ORAL | 3 refills | Status: DC
Start: 2020-03-07 — End: 2021-02-19

## 2020-03-07 MED ORDER — ATORVASTATIN CALCIUM 10 MG PO TABS
10.0000 mg | ORAL_TABLET | Freq: Every day | ORAL | 3 refills | Status: DC
Start: 2020-03-07 — End: 2021-02-20

## 2020-03-07 MED ORDER — POTASSIUM CHLORIDE ER 10 MEQ PO TBCR
10.0000 meq | EXTENDED_RELEASE_TABLET | Freq: Every day | ORAL | 3 refills | Status: DC
Start: 2020-03-07 — End: 2021-01-11

## 2020-03-07 NOTE — Patient Instructions (Addendum)
COVID-19 Vaccine Information can be found at: [email or web address].[email or web address] For questions related to vaccine distribution or appointments, please email [email or web address] or call [phone].   If you are interested in the new shingles vaccine (Shingrix) - call your local pharmacy to check on coverage and availability  If affordable, get on a wait list at your pharmacy to get the vaccine.  Please schedule your mammogram   Flu shot today   Take care of yourself
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
## 2020-03-07 NOTE — Assessment & Plan Note (Signed)
Disc goals for lipids and reasons to control them Rev last labs with pt Rev low sat fat diet in detail Plan to continue atorvastatin and good diet  LDL of 89

## 2020-03-07 NOTE — Assessment & Plan Note (Signed)
Reassuring carotid study  Well controlled DM and HTN and cholesterol

## 2020-03-07 NOTE — Assessment & Plan Note (Signed)
Reviewed health habits including diet and exercise and skin cancer prevention Reviewed appropriate screening tests for age  Also reviewed health mt list, fam hx and immunization status , as well as social and family history   See HPI Discussed shingrix vaccine -interested  Flu shot given today  covid vaccinated and planning booster  Given info to schedule her mammogram at [facility] 10/18   No falls or fx, good exercise

## 2020-03-07 NOTE — Assessment & Plan Note (Signed)
bp in fair control at this time  BP Readings from Last 1 Encounters:  03/07/20 138/78   No changes needed Most recent labs reviewed  Disc lifstyle change with low sodium diet and exercise

## 2020-03-07 NOTE — Assessment & Plan Note (Signed)
Lab Results  Component Value Date   HGBA1C 6.0 (H) 02/24/2020  very well controlled with 500 mg of metformin daily with diet and exercise  Taking statin Avoiding ace due past hx of anaphylaxis with another medication
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
## 2020-03-24 ENCOUNTER — Ambulatory Visit: Payer: PRIVATE HEALTH INSURANCE | Attending: Internal Medicine

## 2020-03-24 DIAGNOSIS — Z23 Encounter for immunization: Secondary | ICD-10-CM

## 2020-03-24 NOTE — Progress Notes (Signed)
   Covid-19 Vaccination Clinic  Name:  ABENI FINCHUM    MRN: [id number] DOB: 05-25-1946  03/24/2020  [name] was observed post Covid-19 immunization for 15 minutes without incident. She was provided with Vaccine Information Sheet and instruction to access the V-Safe system.   Ms. Jollie was instructed to call 911 with any severe reactions post vaccine: Marland Kitchen Difficulty breathing  . Swelling of face and throat  . A fast heartbeat  . A bad rash all over body  . Dizziness and weakness

## 2020-04-14 ENCOUNTER — Encounter: Payer: Self-pay | Admitting: Family Medicine

## 2020-06-01 ENCOUNTER — Encounter: Payer: Self-pay | Admitting: Podiatry

## 2020-06-01 ENCOUNTER — Other Ambulatory Visit: Payer: Self-pay

## 2020-06-01 ENCOUNTER — Ambulatory Visit (INDEPENDENT_AMBULATORY_CARE_PROVIDER_SITE_OTHER): Payer: PRIVATE HEALTH INSURANCE | Admitting: Podiatry

## 2020-06-01 DIAGNOSIS — L84 Corns and callosities: Secondary | ICD-10-CM | POA: Diagnosis not present

## 2020-06-01 NOTE — Progress Notes (Signed)
   Subjective: 75 y.o. female presenting to the office today as a reestablished new patient for evaluation of symptomatic corns and calluses to the bilateral second toes right greater than the left.  Patient states they have been present for several years.  She experiences stinging and burning sensation at times.  She has been shaving down the calluses herself and wearing larger shoes with OTC corn pads.  She presents for further treatment evaluation   Past Medical History:  Diagnosis Date  . Allergic rhinitis   . Frequency of urination   . GERD (gastroesophageal reflux disease)   . Hematuria   . History of echocardiogram    in epic 12-22-2006  mild AV sclerosis without stenosis,  normal LVF, ef 55-60%,  trivial TR  . History of gastroesophageal reflux (GERD)    per pt no issues since stopped drinking soda's  . History of kidney stones   . History of nuclear stress test    in epic 09-25-2005 normal with no ischemia, ef 69%  . Hypertension   . Nephrolithiasis    bilateral non-obstructive per CT 11-25-2018  . Nocturia   . OA (osteoarthritis)   . Right ureteral stone   . Type 2 diabetes mellitus ([place])    followed by pcp  . Urgency of urination   . Wears glasses      Objective:  Physical Exam General: Alert and oriented x3 in no acute distress  Dermatology: Hyperkeratotic lesion(s) present on the bilateral second digit overlying the DIPJ. Pain on palpation with a central nucleated core noted. Skin is warm, dry and supple bilateral lower extremities. Negative for open lesions or macerations.  Vascular: Palpable pedal pulses bilaterally. No edema or erythema noted. Capillary refill within normal limits.  Neurological: Epicritic and protective threshold grossly intact bilaterally.   Musculoskeletal Exam: Pain on palpation at the keratotic lesion(s) noted. Range of motion within normal limits bilateral. Muscle strength 5/5 in all groups bilateral.  Assessment: 1.  Symptomatic  corns second digits bilateral right greater than left   Plan of Care:  1. Patient evaluated 2.  Silicone toe caps were provided to apply to the toes with close toed shoes  3.  Recommend wide fitting shoes that do not irritate the toes and constrict the toebox area 4. Patient is to return to the clinic PRN.   Edrick Kins, DPM [facility]  Dr. Edrick Kins, [address]                                        [place], [place] [address]                Office [phone]  Fax [phone]

## 2020-06-25 DIAGNOSIS — N904 Leukoplakia of vulva: Secondary | ICD-10-CM | POA: Insufficient documentation

## 2020-08-26 ENCOUNTER — Telehealth: Payer: Self-pay | Admitting: Registered Nurse

## 2020-08-26 ENCOUNTER — Encounter: Payer: Self-pay | Admitting: Registered Nurse

## 2020-08-26 DIAGNOSIS — Z20822 Contact with and (suspected) exposure to covid-19: Secondary | ICD-10-CM

## 2020-08-26 DIAGNOSIS — N200 Calculus of kidney: Secondary | ICD-10-CM | POA: Insufficient documentation

## 2020-08-26 NOTE — Telephone Encounter (Signed)
Patient had close covid contact at work on 08/21/2020 and notified by HR after contact tracing completed.  Patient fully covid vaccinated/up to date and instructed to wear mask for 10 days and monitor for symptoms and perform testing on day 5.  Day 0 08/21/20  Day 5 08/26/2020  Day 10 08/31/2020.  Patient reported home test negative today, denied covid symptoms.  Patient to continue wearing mask through day 10 08/31/2020 and not eating in employee lunchroom until 09/01/2020.  Patient A&Ox3 respirations even and unlabored spoke full sentences without difficulty.  No throat clearing/nasal congestion or cough noted during 2 minute telephone conversation.  Patient verbalized understanding information/instructions, agreed with plan of care and had no further questions at this time.  HR notified patient reported negative covid test and continues asymptomatic.

## 2020-09-27 NOTE — Telephone Encounter (Signed)
Patient never developed symptoms feeling well

## 2020-10-09 ENCOUNTER — Telehealth: Payer: Self-pay | Admitting: *Deleted

## 2020-10-09 ENCOUNTER — Encounter: Payer: Self-pay | Admitting: *Deleted

## 2020-10-09 DIAGNOSIS — Z20822 Contact with and (suspected) exposure to covid-19: Secondary | ICD-10-CM

## 2020-10-09 NOTE — Telephone Encounter (Signed)
Pt emailed [employer] and clinic notifying of her spouse testing positive for Covid. She reports she is asymptomatic and isolating separately in the home. They are wearing masks when in common areas. Pt is vaxxed and boosted. No quarantine indicated at this time.  Husband tested positive and she started isolating on 5/16.  Day 0 5/16 Recommended testing, home test is okay, on Day 5 post exposure, 5/21.  Strict mask use thru 5/26.   If sx develop, notify clinic and test Day 3 after sx start.

## 2020-10-09 NOTE — Telephone Encounter (Signed)
Reviewed RN Hildred Alamin note agreed with plan of care.  Will encourage high touch surface disinfecting daily with chlorox/lysol or bleach; wearing mask when at home until spouse 10 days after positive test date and increasing air exchanges in home with air conditioning/fan during spouse quarantine also.  Message left for patient.

## 2020-10-11 NOTE — Telephone Encounter (Signed)
Pt remains asymptomatic. Testing scheduled for her post exposure Day 5 on 5/21. Will f/u Monday for results.

## 2020-10-11 NOTE — Telephone Encounter (Signed)
Reviewed RN Haley note agreed with plan of care. 

## 2020-10-11 NOTE — Telephone Encounter (Signed)
Patient with question if she can schedule appt for covid booster onsite since she had close covid contact.  Currently asymptomatic and going to covid test this weekend.  Discussed with patient she can come onsite with strict mask use as long as no symptoms continue and negative covid test this weekend.  Patient reported she is disinfecting high touch surfaces daily since spouse positive covid test. Patient verbalized understanding information/instructions, agreed with plan of care and had no further questions at this time.

## 2020-10-15 NOTE — Telephone Encounter (Signed)
Reviewed RN Hildred Alamin note; negative home test and continue strict mask use/agreed with plan of care.

## 2020-10-15 NOTE — Telephone Encounter (Signed)
Pt emailed RN reporting negative home test on 5/21. Still asymptomatic. Understands strict mask use thru 5/26.

## 2020-10-18 NOTE — Telephone Encounter (Signed)
Attempted to call pt for Day 10 update to close case. No answer. LVMRCB.

## 2020-10-18 NOTE — Telephone Encounter (Signed)
Reviewed RN Hildred Alamin note patient asymptomatic feeling well day 10 of symptom monitoring today. Closing encounter patient aware to notify clinic staff if symptoms develop.

## 2020-10-18 NOTE — Telephone Encounter (Signed)
Pt returned call. Reports still asymptomatic, feels well. Today day 10. Closing encounter.

## 2020-10-23 ENCOUNTER — Telehealth: Payer: Self-pay | Admitting: *Deleted

## 2020-10-23 ENCOUNTER — Encounter: Payer: Self-pay | Admitting: *Deleted

## 2020-10-23 DIAGNOSIS — U071 COVID-19: Secondary | ICD-10-CM

## 2020-10-23 NOTE — Telephone Encounter (Signed)
Reviewed RN Hildred Alamin note agreed with plan of care.  Message telephone left for patient to see if interested in starting covid antiviral therapy as due to age, type II diabetes, dyslipidemia at higher risk for severe covid.  Received second covid booster on 16 Oct 2020 at [facility] onsite clinic.  Patient to call me on afterhours/work from home line number [phone].

## 2020-10-23 NOTE — Telephone Encounter (Signed)
HR notified RN that pt reported a positive covid test today. Spoke with pt by phone. She reports having out of state family staying with her and attending a wedding this weekend. Her sx started last night 5/30. She c/o HA, nasal congestion, productive cough. He daughter tested herself this morning and was positive and contacted pt who then tested with a home test and was positive as well.    Day 0 5/30 Day 5 6/4 RTW 6/5 or next scheduled workday of 6/6 with strict mask use thru 6/9.   Pt is not doing any home treatment. Discussed restarting Allegra and Nasonex as she is not currently taking and then adding in Dayquil/Nyquil for productive cough and nasal/sinus congestion. Denies further needs or concerns.

## 2020-10-24 NOTE — Telephone Encounter (Signed)
Patient returned call slept most of the day and went to bed early last night 7pm.  Cough kept waking her up last night.  Tried dayquil this morning helping some.  Cough nonproductive, not throwing up or wheezing.  Has oxygen monitoring app on her phone.  Hasn't checked her levels since being diagnosed with covid.  Will check after we hang up and call me if 90 or less.  Will have RN Hildred Alamin mail her clinic loaner sp02 monitor tomorrow.  Discussed honey 1 tablespoon every 4 hours helpful for cough.  She thinks they may have some nyquil in the house also.  Denied wheezing/chest tightness/shortness of breath.  Discussed if throwing up after coughing I recommend starting promethazine syrup which she has used in the past on chart review. I would enter Rx to her pharmacy of choice.  Molnupiravir patient fact sheet sent to patient work email per her preference.  She does not want to start anti-virals at this time but will consider if symptoms not improving/worsening.  She is concerned as she has multiple drug allergies about starting new medications.  Discussed that none of her other drug allergies are medicines in the same class.  Discussed tessalon pearles for cough also patient refused at this time.  Discussed if sp02 90 or less consider starting oral steroid treatment and antivirals/monoclonal antibody infusion.  Discussed hydrating to avoid dehydration as would worsen her blood sugars which covid typically elevates/illness causes blood sugar elevation also.  Discussed RN Hildred Alamin or I will contact her again tomorrow to follow up. Patient A&Ox3 spoke full sentences without difficulty no cough audible during call; nasal congestion noted; no throat clearing audible. Patient verbalized understanding information/instructions, agreed with plan of care and had no further questions at this time.  HR notified patient continues symptomatic not cleared to return onsite at this time.

## 2020-10-25 NOTE — Telephone Encounter (Signed)
Patient contacted NP does not want to start antivirals.  Dayquil controlling symptoms and feeling better this am.  No further questions or concerns today.

## 2020-10-26 NOTE — Telephone Encounter (Signed)
Patient contacted via telephone and stated fatigue continuing.  Dayquil and nyquil helping to control symptoms.  Sp02 97% RA the other day.  Hasn't checked today.  Intermittent headache when medicine wearing off.  Discussed pacing activities, recommended 7-8 hours sleep per night, hydrating, regular meals.  Discussed if after day 10 doesn't feel full shift would need to schedule with PCM for work restrictions.  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  Discussed with patient first eligible day back at work onsite 10/29/20. Mask wear through day 10 6/10 and no eating in employee lunchroom.  Patient asked when she is no longer shedding virus and discussed she can do home test to see if positive and if yes that means still shedding virus on day 10.  Discussed with patient I would check in with her on Sunday again.  Patient A&Ox3 spoke full sentences without difficulty no cough/throat clearing or nasal congestion audible during 4 minute telephone call. Patient verbalized understanding information/instructions, agreed with plan of care and had no further questions at this time.  HR notified unchanged and estimated RTW onsite 10/29/20

## 2020-10-28 NOTE — Telephone Encounter (Signed)
Patient contacted via telephone stated tried not to take her tylenol cold and flu today because it makes her drowsy and she wanted to see how she did without it before going back to work onsite tomorrow.  She has been coughing, sneezing, post nasal drip, headache and feeling crummy today without meds.  Patient spoke full sentences without difficulty, nonproductive mild cough, nasal congestion and throat clearing noted during 4 minute telephone call.  Encouraged patient to restart her tylenol cold and flu per manufacturer instructions.  Discussed pseudoephedrine might be an option if flonase and nasal saline not providing enough rhinitis control.  She must show her driver's license at pharmacy drive through to purchase and trial dose to see if as drowsy as with tylenol cold and flu most likely phenylephrine.  HR will be notified not cleared to return onsite tomorrow and RN Hildred Alamin will check in with her Monday.  Patient encouraged to rest, hydrate, eat regular meals, take medicine to control her rhinitis otherwise airways/sinuses get inflamed and then we might have to deal with pneumonia/bronchitis/sinusitis on top of covid.  Patient verbalized understanding information/instructions, agreed with plan of care and had no further questions at this time.

## 2020-10-29 NOTE — Telephone Encounter (Signed)
Patient contacted via telephone stated she emailed RN Hildred Alamin today feeling a little better and went into work late and left a little early.  Held all prn meds this am to avoid sedation.  Worked 7 hours today.  Planning to take nyquil tonight.  Discussed nasal saline and flonase nonsedating and can use am/pm.  Nasal congestion milder today during telephone call.  No nasal sniffing/throat clearing or cough noted during 4 minute telephone call.  Patient A&Ox3 spoke full sentences without difficulty.  Notified patient RN Hildred Alamin was unexpectedly out of clinic today/clinic closed.  I will be onsite tomorrow if she needs appt with me or further concerns 09-12.  I will follow up with her tomorrow.  Patient verbalized understanding information/instructions, agreed with plan of care and had no further questions at this time.  HR notified patient cleared to be onsite 6/6 and strict mask wear through 6/9 and no eating in employee lunch room.

## 2020-10-30 NOTE — Telephone Encounter (Signed)
Patient at work today no worsening of symptoms.

## 2020-11-06 ENCOUNTER — Other Ambulatory Visit: Payer: Self-pay

## 2020-11-06 ENCOUNTER — Ambulatory Visit: Payer: Self-pay | Admitting: Registered Nurse

## 2020-11-06 DIAGNOSIS — R300 Dysuria: Secondary | ICD-10-CM

## 2020-11-06 LAB — POCT URINALYSIS DIPSTICK
Bilirubin, UA: NEGATIVE
Glucose, UA: NEGATIVE mg/dL
Ketones, POC UA: NEGATIVE mg/dL
Nitrite, UA: NEGATIVE
Protein, UA: NEGATIVE
Specific Gravity, UA: 1.005 (ref 1.005–1.030)
Urobilinogen, UA: 0.2 E.U./dL
pH, UA: 6 (ref 5.0–8.0)

## 2020-11-06 MED ORDER — NITROFURANTOIN MONOHYD MACRO 100 MG PO CAPS: 100.0000 mg | ORAL_CAPSULE | Freq: Two times a day (BID) | ORAL | 0 refills | Status: DC

## 2020-11-06 NOTE — Patient Instructions (Signed)
Hematuria, Adult Hematuria is blood in the urine. Blood may be visible in the urine, or it may be identified with a test. This condition can be caused by infections of the bladder, urethra, kidney, or prostate. Other possible causes include: Kidney stones. Cancer of the urinary tract. Too much calcium in the urine. Conditions that are passed from parent to child (inherited conditions). Exercise that requires a lot of energy. Infections can usually be treated with medicine, and a kidney stone usually will pass through your urine. If neither of these is the cause of yourhematuria, more tests may be needed to identify the cause of your symptoms. It is very important to tell your health care provider about any blood in your urine, even if it is painless or the blood stops without treatment. Blood in the urine, when it happens and then stops and then happens again, can be a symptom of a very serious condition, including cancer. There is no pain in theinitial stages of many urinary cancers. Follow these instructions at home: Medicines Take over-the-counter and prescription medicines only as told by your health care provider. If you were prescribed an antibiotic medicine, take it as told by your health care provider. Do not stop taking the antibiotic even if you start to feel better. Eating and drinking Drink enough fluid to keep your urine pale yellow. It is recommended that you drink 3-4 quarts (2.8-3.8 L) a day. If you have been diagnosed with an infection, drinking cranberry juice in addition to large amounts of water is recommended. Avoid caffeine, tea, and carbonated beverages. These tend to irritate the bladder. Avoid alcohol because it may irritate the prostate (in males). General instructions If you have been diagnosed with a kidney stone, follow your health care provider's instructions about straining your urine to catch the stone. Empty your bladder often. Avoid holding urine for long periods  of time. If you are female: After a bowel movement, wipe from front to back and use each piece of toilet paper only once. Empty your bladder before and after sex. Pay attention to any changes in your symptoms. Tell your health care provider about any changes or any new symptoms. It is up to you to get the results of any tests. Ask your health care provider, or the department that is doing the test, when your results will be ready. Keep all follow-up visits. This is important. Contact a health care provider if: You develop back pain. You have a fever or chills. You have nausea or vomiting. Your symptoms do not improve after 3 days. Your symptoms get worse. Get help right away if: You develop severe vomiting and are unable to take medicine without vomiting. You develop severe pain in your back or abdomen even though you are taking medicine. You pass a large amount of blood in your urine. You pass blood clots in your urine. You feel very weak or like you might faint. You faint. Summary Hematuria is blood in the urine. It has many possible causes. It is very important that you tell your health care provider about any blood in your urine, even if it is painless or the blood stops without treatment. Take over-the-counter and prescription medicines only as told by your health care provider. Drink enough fluid to keep your urine pale yellow. This information is not intended to replace advice given to you by your health care provider. Make sure you discuss any questions you have with your healthcare provider. Document Revised: 01/11/2020 Document Reviewed: 01/11/2020  Elsevier Patient Education  2022 [employer]. Urinary Tract Infection, Adult  A urinary tract infection (UTI) is an infection of any part of the urinary tract. The urinary tract includes the kidneys, ureters, bladder, and urethra.These organs make, store, and get rid of urine in the body. An upper UTI affects the ureters and  kidneys. A lower UTI affects the bladderand urethra. What are the causes? Most urinary tract infections are caused by bacteria in your genital area around your urethra, where urine leaves your body. These bacteria grow andcause inflammation of your urinary tract. What increases the risk? You are more likely to develop this condition if: You have a urinary catheter that stays in place. You are not able to control when you urinate or have a bowel movement (incontinence). You are female and you: Use a spermicide or diaphragm for birth control. Have low estrogen levels. Are pregnant. You have certain genes that increase your risk. You are sexually active. You take antibiotic medicines. You have a condition that causes your flow of urine to slow down, such as: An enlarged prostate, if you are female. Blockage in your urethra. A kidney stone. A nerve condition that affects your bladder control (neurogenic bladder). Not getting enough to drink, or not urinating often. You have certain medical conditions, such as: Diabetes. A weak disease-fighting system (immunesystem). Sickle cell disease. Gout. Spinal cord injury. What are the signs or symptoms? Symptoms of this condition include: Needing to urinate right away (urgency). Frequent urination. This may include small amounts of urine each time you urinate. Pain or burning with urination. Blood in the urine. Urine that smells bad or unusual. Trouble urinating. Cloudy urine. Vaginal discharge, if you are female. Pain in the abdomen or the lower back. You may also have: Vomiting or a decreased appetite. Confusion. Irritability or tiredness. A fever or chills. Diarrhea. The first symptom in older adults may be confusion. In some cases, they may nothave any symptoms until the infection has worsened. How is this diagnosed? This condition is diagnosed based on your medical history and a physical exam. You may also have other tests,  including: Urine tests. Blood tests. Tests for STIs (sexually transmitted infections). If you have had more than one UTI, a cystoscopy or imaging studies may be doneto determine the cause of the infections. How is this treated? Treatment for this condition includes: Antibiotic medicine. Over-the-counter medicines to treat discomfort. Drinking enough water to stay hydrated. If you have frequent infections or have other conditions such as a kidney stone, you may need to see a health care provider who specializes in the urinary tract (urologist). In rare cases, urinary tract infections can cause sepsis. Sepsis is a life-threatening condition that occurs when the body responds to an infection. Sepsis is treated in the hospital with IV antibiotics, fluids, and othermedicines. Follow these instructions at home:  Medicines Take over-the-counter and prescription medicines only as told by your health care provider. If you were prescribed an antibiotic medicine, take it as told by your health care provider. Do not stop using the antibiotic even if you start to feel better. General instructions Make sure you: Empty your bladder often and completely. Do not hold urine for long periods of time. Empty your bladder after sex. Wipe from front to back after urinating or having a bowel movement if you are female. Use each tissue only one time when you wipe. Drink enough fluid to keep your urine pale yellow. Keep all follow-up visits. This is important. Contact  a health care provider if: Your symptoms do not get better after 1-2 days. Your symptoms go away and then return. Get help right away if: You have severe pain in your back or your lower abdomen. You have a fever or chills. You have nausea or vomiting. Summary A urinary tract infection (UTI) is an infection of any part of the urinary tract, which includes the kidneys, ureters, bladder, and urethra. Most urinary tract infections are caused by  bacteria in your genital area. Treatment for this condition often includes antibiotic medicines. If you were prescribed an antibiotic medicine, take it as told by your health care provider. Do not stop using the antibiotic even if you start to feel better. Keep all follow-up visits. This is important. This information is not intended to replace advice given to you by your health care provider. Make sure you discuss any questions you have with your healthcare provider. Document Revised: 12/23/2019 Document Reviewed: 12/23/2019 Elsevier Patient Education  [employer].

## 2020-11-06 NOTE — Progress Notes (Signed)
Subjective:    Patient ID: Laurie Tran, female    DOB: 12-Jun-1945, 75 y.o.   MRN: [id number]  74y/o Caucasian established female pt c/o suprapubic pressure, urinary frequency, not feeling like she is emptying her bladder completely. Symptoms started yesterday.  Flank pain today.  Denied hand/feet swelling, fever, chills.  Last UTI a couple years ago.  History of hospitalization/sepsis with UTI  Urine clear not smelly.  Had positive covid test two weeks ago.  Still some fatigue and post nasal drip.     Review of Systems  Constitutional:  Positive for fatigue. Negative for chills, diaphoresis, fever and unexpected weight change.  HENT:  Positive for postnasal drip. Negative for trouble swallowing and voice change.   Eyes:  Negative for photophobia and visual disturbance.  Respiratory:  Negative for cough, shortness of breath, wheezing and stridor.   Cardiovascular:  Negative for leg swelling.  Gastrointestinal:  Positive for abdominal pain. Negative for diarrhea, nausea and vomiting.  Endocrine: Negative for cold intolerance and heat intolerance.  Genitourinary:  Positive for dysuria, flank pain, frequency and urgency. Negative for difficulty urinating, hematuria, menstrual problem, pelvic pain, vaginal bleeding, vaginal discharge and vaginal pain.  Musculoskeletal:  Positive for back pain. Negative for gait problem, neck pain and neck stiffness.  Skin:  Negative for rash.  Allergic/Immunologic: Positive for environmental allergies, food allergies and immunocompromised state.  Neurological:  Negative for dizziness, tremors, seizures, syncope, facial asymmetry, speech difficulty, weakness, light-headedness, numbness and headaches.  Hematological:  Negative for adenopathy. Does not bruise/bleed easily.  Psychiatric/Behavioral:  Negative for agitation, confusion and sleep disturbance.       Objective:   Physical Exam Vitals and nursing note reviewed.  Constitutional:      General: She  is awake. She is not in acute distress.    Appearance: Normal appearance. She is well-developed, well-groomed and normal weight. She is not ill-appearing, toxic-appearing or diaphoretic.  HENT:     Head: Normocephalic and atraumatic.     Jaw: There is normal jaw occlusion.     Salivary Glands: Right salivary gland is not diffusely enlarged. Left salivary gland is not diffusely enlarged.     Right Ear: Hearing and external ear normal.     Left Ear: Hearing and external ear normal.     Nose: Nose normal. No congestion or rhinorrhea.     Mouth/Throat:     Lips: Pink. No lesions.     Mouth: Mucous membranes are moist. No oral lesions or angioedema.     Pharynx: Oropharynx is clear.  Eyes:     General: Lids are normal. Vision grossly intact. Gaze aligned appropriately. Allergic shiner present. No scleral icterus.       Right eye: No discharge.        Left eye: No discharge.     Extraocular Movements: Extraocular movements intact.     Conjunctiva/sclera: Conjunctivae normal.     Pupils: Pupils are equal, round, and reactive to light.  Neck:     Trachea: Trachea and phonation normal. No tracheal deviation.  Cardiovascular:     Rate and Rhythm: Normal rate and regular rhythm.     Pulses: Normal pulses.          Radial pulses are 2+ on the right side and 2+ on the left side.     Heart sounds: Normal heart sounds.  Pulmonary:     Effort: Pulmonary effort is normal. No respiratory distress.     Breath sounds: Normal breath sounds and  air entry. No stridor or transmitted upper airway sounds. No wheezing or rales.     Comments: Spoke full sentences without difficulty; wearing surgical mask due to covid 19 pandemic; no cough observed in exam room Abdominal:     General: Abdomen is flat. Bowel sounds are decreased. There is no distension.     Palpations: Abdomen is soft. There is no shifting dullness, fluid wave, hepatomegaly, splenomegaly, mass or pulsatile mass.     Tenderness: There is  abdominal tenderness in the suprapubic area. There is no right CVA tenderness, left CVA tenderness, guarding or rebound. Negative signs include Murphy's sign.     Hernia: No hernia is present.     Comments: Slightly TTP suprapubic; not TTP bilateral CVA; standing to sitting supine and reverse quickly on exam table without assist; hypoactive bowel sounds x 4 quads; dull to percussion x 4 quads  Musculoskeletal:        General: No swelling, tenderness, deformity or signs of injury. Normal range of motion.     Right elbow: No swelling, deformity, effusion or lacerations. Normal range of motion.     Left elbow: No swelling, deformity, effusion or lacerations. Normal range of motion.     Right hand: No swelling or tenderness. Normal range of motion. Normal strength.     Left hand: No swelling or tenderness. Normal range of motion. Normal strength.     Cervical back: Normal range of motion and neck supple. No swelling, edema, deformity, erythema, signs of trauma, lacerations, rigidity, tenderness or crepitus. No pain with movement. Normal range of motion.     Thoracic back: No swelling, edema, deformity, signs of trauma, lacerations, spasms or tenderness.     Lumbar back: No swelling, edema, deformity, signs of trauma, lacerations, spasms or tenderness. Normal range of motion.       Back:     Right lower leg: No edema.     Left lower leg: No edema.  Lymphadenopathy:     Head:     Right side of head: No preauricular adenopathy.     Left side of head: No preauricular adenopathy.     Cervical: No cervical adenopathy.     Right cervical: No superficial cervical adenopathy.    Left cervical: No superficial cervical adenopathy.  Skin:    General: Skin is warm and dry.     Capillary Refill: Capillary refill takes less than 2 seconds.     Coloration: Skin is not ashen, cyanotic, jaundiced, mottled, pale or sallow.     Findings: No abrasion, abscess, acne, bruising, burn, ecchymosis, erythema, signs of  injury, laceration, lesion, petechiae, rash or wound.     Nails: There is no clubbing.  Neurological:     General: No focal deficit present.     Mental Status: She is alert and oriented to person, place, and time. Mental status is at baseline.     GCS: GCS eye subscore is 4. GCS verbal subscore is 5. GCS motor subscore is 6.     Cranial Nerves: Cranial nerves are intact. No cranial nerve deficit, dysarthria or facial asymmetry.     Sensory: Sensation is intact. No sensory deficit.     Motor: Motor function is intact. No weakness, tremor, atrophy, abnormal muscle tone or seizure activity.     Coordination: Coordination is intact. Coordination normal.     Gait: Gait is intact. Gait normal.     Comments: Gait sure and steady in clinic; in/out of chair and on/off exam table without  difficulty; bilateral hand grasp equal  Psychiatric:        Attention and Perception: Attention and perception normal.        Mood and Affect: Mood and affect normal.        Speech: Speech normal.        Behavior: Behavior normal. Behavior is cooperative.        Thought Content: Thought content normal.        Cognition and Memory: Cognition and memory normal.        Judgment: Judgment normal.      Reviewed Epic last UTI e coli no resistance seen on culture and treated with macrobid.    Assessment & Plan:  A-UTI with microscopic hematuria  P-allergic to sulfa and nsaids.  Given thermacare back patches x 2 for use at work today and tomorrow for back pain.  Dipstick urinalysis done in clinic POCT results discussed with patient + blood and leukocyte esterase consistent for UTI.  Urine Culture sent to [facility] will call with lab results if resistance or change in antibiotic indicated.  Electronic Rx sent to her pharmacy of choice macrobid 100mg  po BID x 7 days #14 RF0.  She has azo at home for prn use.  Medications as directed. Patient is also to push fluids and may use Pyridium 200mg  po TID as needed. Hydrate, avoid  dehydration. Avoid holding urine void on frequent basis every 4 to 6 hours. If unable to void every 8 hours follow up for re-evaluation with PCM, urgent care or ER. Call or return to clinic as needed if these symptoms worsen or fail to improve as anticipated. [facility]care handouts on hematuria and UTI printed and given to patient  Patient verbalized agreement and understanding of treatment plan and had no further questions at this time.  P2: Hydrate and cranberry juice

## 2020-11-07 NOTE — Telephone Encounter (Signed)
Day 10 strict mask wear has passed patient seen in clinic today still having some post nasal drip and fatigue.

## 2020-11-10 LAB — URINE CULTURE

## 2020-11-15 NOTE — Telephone Encounter (Signed)
Patient left message having UTI symptoms again so she went to her urologist's office today for re-evaluation.  She had missed a day of macrobid use.  Urologist doing CT scan today and prescribing her another antibiotic.

## 2020-11-20 ENCOUNTER — Telehealth: Payer: Self-pay | Admitting: *Deleted

## 2020-11-20 MED ORDER — FLUCONAZOLE 150 MG PO TABS: 150.0000 mg | ORAL_TABLET | Freq: Once | ORAL | 1 refills | Status: AC

## 2020-11-20 NOTE — Telephone Encounter (Signed)
Pt calls clinic looking for recommendations regarding a kidney stone she currently has and Urology. She was started on abx by NP Otila Kluver 6/14. She has seen urology since then and was told she has a small stone blocking her ureter. She also was changed to a different abx that she has 5 days remaining of. Urine culture 6/14 was sensitive to all tested abx.  She is concerned that she is not having any improvement in her flank pain, suprapubic pressure, urinary urgency, despite Flomax and 2 abx and does not want to start another abx. Has called Urology last night and notified them of continued sx. No change in plan of care, was told to push fluids. Advised pt to call Urology again and again notify them of no improvement and feels sx are worsening and that she has never been able to pass a stone previously without procedural intervention. Pt also concerned regarding yeast infection sx that have started since yesterday. C/o vulvular itching/irritation/redness. Case discussed with NP Nira Conn in clinic Otila Kluver on vacation). Verbal orders received for diflucan 150mg  po once and referral back to Urology. Med sent to preferred pharmacy.

## 2020-11-22 ENCOUNTER — Other Ambulatory Visit: Payer: Self-pay

## 2020-11-22 ENCOUNTER — Ambulatory Visit: Payer: Self-pay | Admitting: *Deleted

## 2020-11-22 DIAGNOSIS — R300 Dysuria: Secondary | ICD-10-CM

## 2020-11-22 LAB — POCT URINALYSIS DIPSTICK
Bilirubin, UA: NEGATIVE
Glucose, UA: NEGATIVE mg/dL
Ketones, POC UA: NEGATIVE mg/dL
Nitrite, UA: NEGATIVE
Protein Ur, POC: NEGATIVE mg/dL
Specific Gravity, UA: 1.03 (ref 1.005–1.030)
Urobilinogen, UA: 0.2 E.U./dL
pH, UA: 6 (ref 5.0–8.0)

## 2020-11-22 NOTE — Progress Notes (Signed)
Pt in requesting repeat UA. Being followed by Urology. See 6/28 tcon for further. Pain is mostly in flank now with suprapubic only occurring with urination, not constant. Urology was contacted earlier this with sx update and they told her to give abx more time and push fluids. She has been doing this. Wanted to see if UA was improving. Today blood from small to trace and leukocytes from moderate to small so is improving. Reminded pt the urology office will have someone on call this weekend and to notify them if her sx worsen. If vomiting, fever, urinating frank blood, severe pain, proceed directly to ED. She is agreeable and verbalizes understanding.  Followed up on yeast infection sx and she reports she is picking up Diflucan tonight. She had started an OTC medication Tuesday that she already had at home and sts sx are slowly improving. No further concerns with this complaint.

## 2020-11-29 NOTE — Progress Notes (Signed)
RN called pt 7/7 to ensure yeast inf sx had cleared fully and if she had any new recommendations or orders from Urology. No answer. LVM.

## 2020-12-04 NOTE — Telephone Encounter (Signed)
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  Today last day of third round of antibiotics (she thinks was a penicillin).  Going to see her new PCM for follow up.  Discussed with patient I cannot see her urology office notes, scan or labs in Epic as they use a different system.  Symptoms have resolved for urinary but still feeling fatigued after covid.  Patient had no further questions or concerns at this time and will follow up with urology if urinary symptoms reoccur like they did after macrobid and second antibiotic completed.  Encouraged patient to continue hydrating as heat index back over a hundred today.  Patient verbalized understanding information/instructions and had no further questions at this time.

## 2020-12-06 ENCOUNTER — Ambulatory Visit: Payer: Self-pay | Admitting: Registered Nurse

## 2020-12-06 ENCOUNTER — Encounter: Payer: Self-pay | Admitting: Registered Nurse

## 2020-12-06 ENCOUNTER — Telehealth: Payer: Self-pay | Admitting: Registered Nurse

## 2020-12-06 ENCOUNTER — Other Ambulatory Visit: Payer: Self-pay

## 2020-12-06 DIAGNOSIS — R300 Dysuria: Secondary | ICD-10-CM

## 2020-12-06 NOTE — Telephone Encounter (Signed)
Patient seen in clinic today. Patient notified me she had yeast infection after completing urologist antibiotics and diflucan helped. Dipstick urinalysis results moderate leukocytes, trace protein and blood, negative nitrates, glucose, bilirubin, ketones and pH 6.0 Sq 1.030 and uro 0.2.  Patient notified to follow up with urologist as had CT recently and repeat urine culture.  She was unable to log into her patient portal for me to review results from urology and none noted in [facility] Everywhere for me to review as she has had 3 different antibiotics in the previous month also.  [facility] courier had already come today and specimen not sufficient for urine culture sample this afternoon either.  Urine clear gold less than 10cc no foul odor or cloudiness.  Patient is having left flank pain and dysuria resumed as in the previous weeks once she is off antibiotics 1-2 days symptoms reoccurring.  Patient A&Ox3 spoke full sentences without difficulty skin warm dry and pink respirations even and unlabored gait sure and steady in clinic.  Push po fluids, may use OTC azo 1-2 tabs po q8h prn dysuria today until she can be seen by urology tomorrow.  Patient verbalized understanding information/instructions, agreed with plan of care and verbalized understanding that dipstick urinalysis consistent with UTI but I am unable to see her previous urine culture results from urology since  I had office visit with her on 11/06/20 and she finished course of nitrofurantoin.  Noted in Epic tonight augmentin 500/125mg  sig t1 po BID dispensed 11/27/20 #14 and cephalexin 500mg  po BID #20 dispensed 11/15/20.    Patient also given Rx for tasmsulosin 0.4mg  po qhs #30 on 11/15/20.  Will follow up with patient via telephone tomorrow.

## 2020-12-07 NOTE — Patient Instructions (Signed)
Urinary Tract Infection, Adult  A urinary tract infection (UTI) is an infection of any part of the urinary tract. The urinary tract includes the kidneys, ureters, bladder, and urethra.These organs make, store, and get rid of urine in the body. An upper UTI affects the ureters and kidneys. A lower UTI affects the bladderand urethra. What are the causes? Most urinary tract infections are caused by bacteria in your genital area around your urethra, where urine leaves your body. These bacteria grow andcause inflammation of your urinary tract. What increases the risk? You are more likely to develop this condition if: You have a urinary catheter that stays in place. You are not able to control when you urinate or have a bowel movement (incontinence). You are female and you: Use a spermicide or diaphragm for birth control. Have low estrogen levels. Are pregnant. You have certain genes that increase your risk. You are sexually active. You take antibiotic medicines. You have a condition that causes your flow of urine to slow down, such as: An enlarged prostate, if you are female. Blockage in your urethra. A kidney stone. A nerve condition that affects your bladder control (neurogenic bladder). Not getting enough to drink, or not urinating often. You have certain medical conditions, such as: Diabetes. A weak disease-fighting system (immunesystem). Sickle cell disease. Gout. Spinal cord injury. What are the signs or symptoms? Symptoms of this condition include: Needing to urinate right away (urgency). Frequent urination. This may include small amounts of urine each time you urinate. Pain or burning with urination. Blood in the urine. Urine that smells bad or unusual. Trouble urinating. Cloudy urine. Vaginal discharge, if you are female. Pain in the abdomen or the lower back. You may also have: Vomiting or a decreased appetite. Confusion. Irritability or tiredness. A fever or  chills. Diarrhea. The first symptom in older adults may be confusion. In some cases, they may nothave any symptoms until the infection has worsened. How is this diagnosed? This condition is diagnosed based on your medical history and a physical exam. You may also have other tests, including: Urine tests. Blood tests. Tests for STIs (sexually transmitted infections). If you have had more than one UTI, a cystoscopy or imaging studies may be doneto determine the cause of the infections. How is this treated? Treatment for this condition includes: Antibiotic medicine. Over-the-counter medicines to treat discomfort. Drinking enough water to stay hydrated. If you have frequent infections or have other conditions such as a kidney stone, you may need to see a health care provider who specializes in the urinary tract (urologist). In rare cases, urinary tract infections can cause sepsis. Sepsis is a life-threatening condition that occurs when the body responds to an infection. Sepsis is treated in the hospital with IV antibiotics, fluids, and othermedicines. Follow these instructions at home:  Medicines Take over-the-counter and prescription medicines only as told by your health care provider. If you were prescribed an antibiotic medicine, take it as told by your health care provider. Do not stop using the antibiotic even if you start to feel better. General instructions Make sure you: Empty your bladder often and completely. Do not hold urine for long periods of time. Empty your bladder after sex. Wipe from front to back after urinating or having a bowel movement if you are female. Use each tissue only one time when you wipe. Drink enough fluid to keep your urine pale yellow. Keep all follow-up visits. This is important. Contact a health care provider if: Your symptoms do  not get better after 1-2 days. Your symptoms go away and then return. Get help right away if: You have severe pain in your  back or your lower abdomen. You have a fever or chills. You have nausea or vomiting. Summary A urinary tract infection (UTI) is an infection of any part of the urinary tract, which includes the kidneys, ureters, bladder, and urethra. Most urinary tract infections are caused by bacteria in your genital area. Treatment for this condition often includes antibiotic medicines. If you were prescribed an antibiotic medicine, take it as told by your health care provider. Do not stop using the antibiotic even if you start to feel better. Keep all follow-up visits. This is important. This information is not intended to replace advice given to you by your health care provider. Make sure you discuss any questions you have with your healthcare provider. Document Revised: 12/23/2019 Document Reviewed: 12/23/2019 Elsevier Patient Education  [place]. Dysuria Dysuria is pain or discomfort during urination. The pain or discomfort may be felt in the part of the body that drains urine from the bladder (urethra) or in the surrounding tissue of the genitals. The pain may also be felt inthe groin area, lower abdomen, or lower back. You may have to urinate frequently or have the sudden feeling that you have to urinate (urgency). Dysuria can affect anyone, but it is more common in females. Dysuria can be caused by many different things, including: Urinary tract infection. Kidney stones or bladder stones. Certain STIs (sexually transmitted infections), such as chlamydia. Dehydration. Inflammation of the tissues of the vagina. Use of certain medicines. Use of certain soaps or scented products that cause irritation. Follow these instructions at home: Medicines Take over-the-counter and prescription medicines only as told by your health care provider. If you were prescribed an antibiotic medicine, take it as told by your health care provider. Do not stop taking the antibiotic even if you start to feel  better. Eating and drinking  Drink enough fluid to keep your urine pale yellow. Avoid caffeinated beverages, tea, and alcohol. These beverages can irritate the bladder and make dysuria worse. In males, alcohol may irritate the prostate.  General instructions Watch your condition for any changes. Urinate often. Avoid holding urine for long periods of time. If you are female, you should wipe from front to back after urinating or having a bowel movement. Use each piece of toilet paper only once. Empty your bladder after sex. Keep all follow-up visits. This is important. If you had any tests done to find the cause of dysuria, it is up to you to get your test results. Ask your health care provider, or the department that is doing the test, when your results will be ready. Contact a health care provider if: You have a fever. You develop pain in your back or sides. You have nausea or vomiting. You have blood in your urine. You are not urinating as often as you usually do. Get help right away if: Your pain is severe and not relieved with medicines. You cannot eat or drink without vomiting. You are confused. You have a rapid heartbeat while resting. You have shaking or chills. You feel extremely weak. Summary Dysuria is pain or discomfort while urinating. Many different conditions can lead to dysuria. If you have dysuria, you may have to urinate frequently or have the sudden feeling that you have to urinate (urgency). Watch your condition for any changes. Keep all follow-up visits. Make sure that you urinate  often and drink enough fluid to keep your urine pale yellow. This information is not intended to replace advice given to you by your health care provider. Make sure you discuss any questions you have with your healthcare provider. Document Revised: 12/23/2019 Document Reviewed: 12/23/2019 Elsevier Patient Education  [place].

## 2020-12-07 NOTE — Progress Notes (Signed)
Subjective:    Patient ID: Laurie Tran, female    DOB: 11/23/1945, 75 y.o.   MRN: [id number]  74y/o caucasian female established patient here for dysuria after finishing her antibiotic from urology.  Patient stated this was her third round completed on 12/04/20.  Started to have dysuria today, still having left flank pain, wanting to know if dipstick urinalysis abnormal so she can schedule with urology if needed for follow up.  Stated urology thought they saw kidney stone on June CT, radiologist did not.  She doesn't know if her patient portal will show the names of her antibiotics but she did send them to RN Hildred Alamin who is currently out of the office on vacation. Urinating small amounts frequently.  Not taking flomax.  Hasn't started azo as discomfort tolerable at this time.  Denied hand/feet swelling, headache, fever, chills, or vomiting.  Last office with me for dysuria 11/06/20 and completed round of macrobid.  Symptoms started again after finishing macrobid so she followed up with urologist since I was on vacation.  Got yeast infection with next round of antibiotics from urology "a type of penicillin".       Review of Systems     Objective:   Physical Exam   Results for Deavers, Laurie L "PAT" (MRN [id number]) as of 12/07/2020 15:14 Dipstick in clinic today at 1355 12/06/2020 12/06/2020 1355  negative  clear  yellow  negative  negative  Moderate (2+) (A)  Negative  6.0   trace  1.030  0.2  trace     Ref. Range 11/06/2020   Bilirubin, UA Latest Ref Range: negative  negative  Clarity, UA Latest Ref Range: clear  clear  Color, UA Latest Ref Range: yellow  light yellow (A)  Glucose Latest Ref Range: negative mg/dL negative  Ketones, UA Latest Ref Range: negative mg/dL negative  Leukocytes,UA Latest Ref Range: Negative  Moderate (2+) (A)  Nitrite, UA Latest Ref Range: Negative  Negative  pH, UA Latest Ref Range: 5.0 - 8.0  6.0  Protein,UA Latest Ref Range: Negative  Negative   Specific Gravity, UA Latest Ref Range: 1.005 - 1.030  1.005  Urobilinogen, UA Latest Ref Range: 0.2 or 1.0 E.U./dL 0.2  RBC, UA Latest Ref Range: negative  small (A)   Urine Culture, Routine Final report Abnormal    Organism ID, Bacteria Escherichia coli Abnormal    Comment: Cefazolin <=4 ug/mL  Cefazolin with an MIC <=16 predicts susceptibility to the oral agents  cefaclor, cefdinir, cefpodoxime, cefprozil, cefuroxime, cephalexin,  and loracarbef when used for therapy of uncomplicated urinary tract  infections due to E. coli, Klebsiella pneumoniae, and Proteus  mirabilis.  Greater than 100,000 colony forming units per mL   Antimicrobial Susceptibility Comment   Comment:       ** S = Susceptible; I = Intermediate; R = Resistant **                     P = Positive; N = Negative              MICS are expressed in micrograms per mL     Antibiotic                 RSLT#1    RSLT#2    RSLT#3    RSLT#4  Amoxicillin/Clavulanic Acid    S  Ampicillin                     S  Cefepime                       S  Ceftriaxone                    S  Cefuroxime                     S  Ciprofloxacin                  S  Ertapenem                      S  Gentamicin                     S  Imipenem                       S  Levofloxacin                   S  Meropenem                      S  Nitrofurantoin                 S  Piperacillin/Tazobactam        S  Tetracycline                   S  Tobramycin                     S  Trimethoprim/Sulfa             S   Resulting Agency [facility]        Narrative Performed by: Maryan Puls Performed at:  [facility]  [address], [place], [place]  [id number]  Lab Director: Rush Farmer MD, Phone:  [phone]    Specimen Collected: 11/06/20 11:35 Last Resulted: 11/10/20 14:35         Results for Trompeter, Laurie L "PAT" (MRN [id number]) as of 12/07/2020 15:14  Ref. Range 05/12/2017 10:58  Organism ID, Bacteria Unknown Escherichia coli (A)    Comment: 50,000-100,000 colony forming units per mL  Cefazolin <=4 ug/mL  Cefazolin with an MIC <=16 predicts susceptibility to the oral agents  cefaclor, cefdinir, cefpodoxime, cefprozil, cefuroxime, cephalexin,  and loracarbef when used for therapy of uncomplicated urinary tract  infections due to E. coli, Klebsiella pneumoniae, and Proteus  mirabilis.   ORGANISM ID, BACTERIA Comment Abnormal    Comment: Pseudomonas aeruginosa  25,000-50,000 colony forming units per mL   Antimicrobial Susceptibility Comment   Comment:       ** S = Susceptible; I = Intermediate; R = Resistant **                     P = Positive; N = Negative              MICS are expressed in micrograms per mL     Antibiotic                 RSLT#1    RSLT#2    RSLT#3    RSLT#4  Amikacin                                 S  Amoxicillin/Clavulanic Acid  S  Ampicillin                     S  Cefepime                       S         S  Ceftazidime                              S  Ceftriaxone                    S  Cefuroxime                     S  Ciprofloxacin                  S         S  Ertapenem                      S  Gentamicin                     S         S  Imipenem                       S         S  Levofloxacin                   S         S  Meropenem                      S         S  Nitrofurantoin                 S  Piperacillin                             S  Piperacillin/Tazobactam        S  Tetracycline                   S  Ticarcillin                              S  Tobramycin                     S         S  Trimethoprim/Sulfa             S   Resulting Agency [facility]        Narrative Performed by: Maryan Puls Performed at:  [address]  [address], [place], [place]  [id number]  Lab Director: Rush Farmer MD, Phone:  [phone]    Specimen Collected: 05/12/17 10:58 Last Resulted: 05/18/17 05:36          Assessment & Plan:  A-dysuria  P-probable UTI but patient needs  urine culture and [facility] courier has already picked up for the day and sample size insufficient and clinic closes at 1400.   Differential diagnosis kidney stone, recurrent UTI, yeast infection. Discussed patient has been treating with 3 antibiotics in the past month and had CT scan which I am not  able to see results of in Epic at this time.  Patient will try to see if she can log into patient portal and email me test results/names of medications if unable to see urologist in the next 24 hours.  [email or web address] or call [phone]. Patient to contact urology office to schedule appt with provider today or tomorrow for resubmission urinaysis/urine culture.  Patient is also to push fluids and may use Pyridium (Azo OTC) 200mg  po TID as needed. Hydrate, avoid dehydration. Avoid holding urine void on frequent basis every 4 to 6 hours. If unable to void every 8 hours, vomiting, fever, chills follow up for re-evaluation with PCM, urgent care or ER. Call or return to clinic as needed if these symptoms worsen or fail to improve as anticipated. Exitcare handouts on dysuria and UTI.  Patient verbalized agreement and understanding of treatment plan and had no further questions at this time.  P2: Hydrate and cranberry juice

## 2020-12-07 NOTE — Telephone Encounter (Signed)
Patient contacted via telephone stated using otc AZO red this am until her appt with urology at 1445 today.  Had to leave work today mid shift due to pain/dysuria couldn't concentrate.  Patient reported took flomax initially after prescribed but was then having urinary incontinence so stopped use.  Patient stated urology saw stone on CT but radiologist did not agree.  Patient does not know her previous urine culture results and no access to them in a patient portal.  Patient asking why she is getting repeat UTIs.  Discussed kidney stone could  cause obstruction or back up of urine that doesn't free flow with gravity down the system and like stagnant water when backed up can develop infection/bacterial growth.  Patient also questioned if May covid infection did something to her immune system and I agreed with patient covid could have suppressed her immune response also.  Encouraged patient to keep her urology appt today.  Push water/hydrate and avoid holding urine for more than 2-4 hours between voids.  Discussed if kidney stone tamsulosin (flomax) helps dilate the ureter to allow stone to pass or urine to flow around stone.  If urology does not have provider on call this weekend patient to contact me if new or worsening symptoms [phone].   Discussed with patient could see in epic Rx last night augmentin, flomax and cephalexine dispensed in the previous month after macrodantin.   Patient verbalized understanding information/instructions, agreed with plan of care and had no further questions at this time.

## 2020-12-13 LAB — HM DIABETES EYE EXAM

## 2020-12-14 NOTE — Telephone Encounter (Signed)
Patient left message started on Nitrofurantoin '100mg'$  1 pill twice a day for 7 days, then 1 pill at night for 23 days.with one refill;  Phenazopyridine 100 mg three times per day; Estrace cream twice per week; and Myrbetyriq 25 mg daily for dysuria by urology.  I left message asking if regimen helping and patient replied it seems to be so far.  She has follow up with urology on Sept 15 Dr. Lovena Neighbours for cystoscopy.  She also has cataract surgery on 15 Sep 22 with Dr. Tommy Rainwater at [facility].  Patient with no further questions or concerns at this time.  Closing encounter.

## 2020-12-20 ENCOUNTER — Telehealth: Payer: Self-pay | Admitting: Registered Nurse

## 2020-12-20 ENCOUNTER — Encounter: Payer: Self-pay | Admitting: Registered Nurse

## 2020-12-20 NOTE — Telephone Encounter (Signed)
Patient to clinic today asking for advice as symptoms worsening since she stopped bid nitrofurantoin and now only on daily dose again.  Wanting to know if she should seek second opinion from another urologist regarding her recurrent UTIs.  Discussed with patient I cannot see her last urine culture results.  She was told it was e coli unsure of sensitivity.  Discussed with patient and RN Hildred Alamin collect urine culture and urinalysis and send to [facility] tomorrow as courier has already picked up specimens today.  Patient to notify urologist symptoms worsening again.  List of [facility] providers given to patient by RN Hildred Alamin.  Patient stated her last lithotripsy for kidney stones did not show any fragments on straining afterward.  Discussed with patient this could be covid related and/or another kidney stone related infection.  Patient sent information on six types of kidney stones and recommended diet/oral water intake for kidney stones to work email. Encouraged patient to continue nitrofurantoin/hydrate. Patient verbalized understanding information/instructions and had no further questions at this time.

## 2020-12-21 ENCOUNTER — Other Ambulatory Visit: Payer: Self-pay

## 2020-12-21 ENCOUNTER — Ambulatory Visit: Payer: Self-pay | Admitting: *Deleted

## 2020-12-21 DIAGNOSIS — N390 Urinary tract infection, site not specified: Secondary | ICD-10-CM

## 2020-12-21 NOTE — Telephone Encounter (Signed)
Patient replied unsure which type of calcium stone she has; will increase her water intake, decrease salt intake and typically does not eat much meat/animal proteins.  She will follow up with urologist regarding stone type.

## 2020-12-21 NOTE — Progress Notes (Signed)
Urinalysis and C&S sent out per NP Tina's orders due to continued UTI sx.

## 2020-12-22 LAB — URINALYSIS, ROUTINE W REFLEX MICROSCOPIC
Bilirubin, UA: NEGATIVE
Glucose, UA: NEGATIVE
Ketones, UA: NEGATIVE
Nitrite, UA: POSITIVE — AB
Protein,UA: NEGATIVE
RBC, UA: NEGATIVE
Specific Gravity, UA: 1.008 (ref 1.005–1.030)
Urobilinogen, Ur: 1 mg/dL (ref 0.2–1.0)
pH, UA: 6 (ref 5.0–7.5)

## 2020-12-22 LAB — MICROSCOPIC EXAMINATION
Bacteria, UA: NONE SEEN
Casts: NONE SEEN /lpf

## 2020-12-24 LAB — URINE CULTURE

## 2020-12-25 NOTE — Progress Notes (Signed)
Spoke to patient via telephone and notified of urine culture results as Epic did not show patient had reviewed my chart message.  Patient stated no changes in urinary symptoms but had severe leg cramps and almost went to ER 12/23/20 spoke with RN Hildred Alamin and noted that lipitor/mybetriq/nitrofurantoin can have myalgias per Up to Date.  Patient stopped myrbetriq and stated last night some leg cramps but mild.  Continuing to monitor.  Denied any new or worsening urinary symptoms.  Patient leaving Friday to go to [place] for the weekend.  Continue pushing fluids, taking macrobid daily.  Repeat urine culture if new or worsening symptoms urinary/back pain/abdomen pain/fever/chills.  Patient verbalized understanding information/instructions, agreed with plan of care and had no further questions at this time.

## 2020-12-26 NOTE — Telephone Encounter (Signed)
Patient left message "Good Morning, I had hoped that the muscle pain was easing.  Last night I had tolerable muscle pain until the toes on my left leg curled under and locked into place.  The pain was intense along with the calf on the same leg.  I could not pry my toes into position.  I tried to walk and eventually my toes returned to normal.  Do you think the medication (myrbetriq) is still in my system and may need more time to wear off?  Thanks, Laurie Tran    Replied to patient 18 "Laurie Tran,  The half life of myrbetriq is 50 hours.  So in 50 hours half of the drug is removed from your body by liver/kidneys then over the next 50 hours half of the half left removed and on it goes.  So yes this drug stays in your system for quite a while."  Patient had no further questions or concerns after reply and responded she understood information.

## 2020-12-28 ENCOUNTER — Encounter: Payer: Self-pay | Admitting: Family Medicine

## 2021-01-02 ENCOUNTER — Telehealth: Payer: Self-pay | Admitting: Registered Nurse

## 2021-01-02 ENCOUNTER — Encounter: Payer: Self-pay | Admitting: Registered Nurse

## 2021-01-02 DIAGNOSIS — J019 Acute sinusitis, unspecified: Secondary | ICD-10-CM

## 2021-01-02 NOTE — Telephone Encounter (Signed)
Patient left message with HR laryngitis and slight cough this am.  Home covid test negative wondering if she can wear mask and still come into work.  HR asked that I contact patient.  Epic does not show second covid vaccine booster; HR vaccine records indicate covid booster #2 received 16 Oct 2020 and patient had positive covid test on 10/22/20  Telephone message left for patient to contact me at [phone]. 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    Patient returned call immediately stated symptoms started last night voice gone, some post nasal drip and cough.  Denied fever/chills/vomiting/diarrhea/sore throat/dysphasia/dysphagia/wheezing/shortness of breath.  Discussed flonase/nasal saline/allegra/tylenol/throat lozenge OTC use; soup broths; popsicles/fruit smoothies without dairy as can worsen mucous production.  Ensure drinking water to stay hydrated as history of kidney stones/recent dysuria on continued nitrofurantoin daily po.  Discussed repeat covid testing tomorrow afternoon and isolate at home/work remote today and tomorrow. Discussed post nasal drip can irritate throat and vocal cords.  Most likely viral etiology but could be allergy flare due to recent travel in [place]. Day 0 8/9  Day 5 8/14  Day 10 8/19.  Discussed with patient if symptoms improving and 2 negative covid tests could return onsite sooner with strict mask wear through Day 10.  Patient recent travel to [place] returned 8/7 denied known sick contacts.  Patient lives alone, office is cubicles/open area.  Patient A&Ox3 voice raspy/broken up, no cough/nasal congestion/throat clearing audible during 5 minute telephone call.  HR notified recommend quarantine  and retest at this time will re-evaluate again tomorrow.  Patient may email [email or web address] or call [phone] if further questions or concerns later today/this week.  Patient verbalized understanding information/instructions, agreed with plan of care and had no further questions at this time.

## 2021-01-03 MED ORDER — PREDNISONE 10 MG (21) PO TBPK: ORAL_TABLET | ORAL | 0 refills | Status: DC

## 2021-01-03 NOTE — Telephone Encounter (Signed)
Patient left message covid test negative this afternoon but having green mucous/feels like sinus infection, headache and still no voice (laryngitis).  Spoke with patient via telephone suspect this is viral sinusitis prednisone '10mg'$  taper with breakfast po (60/50/40/30/20/10) #21 RF0  dispensed from [facility] and given to daughter who will deliver to patient.  Discussed with patient will increase blood sugars and may cause some insomnia/elevated heartrate/BP while taking also.  Patient has previously tolerated use for poison ivy rash 2018.  HR notified patient cleared to return onsite tomorrow if feeling well enough to return onsite/covid test negative/mask wear through day 10 and no eating in employee lunchroom.  Patient to call me if not feeling well enough to return onsite tomorrow [phone]  Patient voice still whisper  today no cough during telephone call A&Ox3 + nasal congestion and throat clearing audible  Continue nasal saline and allegra.  Consider flonase 77mg nasal  1 spray each nostril BID OTC.  Continue hydrating, avoid large portions of dairy, eating regular meals and budgeting for 7-8 hours sleep per day.  Patient verbalized understanding information/instructions, agreed with plan of care and had no further questions at this time.

## 2021-01-04 ENCOUNTER — Ambulatory Visit: Payer: Self-pay | Admitting: *Deleted

## 2021-01-04 NOTE — Telephone Encounter (Signed)
Pt in to clinic this morning requesting BP check and CBG check since on Prednisone. BP 133/84, p109. CBG 152. She reports having watermelon and a donut shortly before arrival to clinic.  She also reports that she feels short of breath but attributes this to mask wear and the laryngitis as it just started as she walking into the building this morning. O2 97% and RR returns to WNL after sitting for 2-3 minutes. Laryngitis still present-voice whisper mostly but some cracking with louder sounds. Mildly improved. She continues her OTC meds, warm salt water gargles, and is drinking salty warm broths and drinks like chicken noodle soup and tea. Discussed adding honey to this as well. Denies further questions at this time.

## 2021-01-04 NOTE — Telephone Encounter (Signed)
Case discussed with RN Hildred Alamin earlier today via telephone; reviewed RN Hildred Alamin note agreed with plan of care.  Will follow up with patient via telephone this weekend.

## 2021-01-09 MED ORDER — GUAIFENESIN ER 600 MG PO TB12: 600.0000 mg | ORAL_TABLET | Freq: Two times a day (BID) | ORAL | Status: AC | PRN

## 2021-01-09 MED ORDER — PROMETHAZINE HCL 6.25 MG/5ML PO SYRP: 6.2500 mg | ORAL_SOLUTION | Freq: Four times a day (QID) | ORAL | 0 refills | Status: DC | PRN

## 2021-01-09 NOTE — Telephone Encounter (Signed)
Patient contacted via telephone and patient reported increased coughing/tickle productive making her tired and prolonged coughing especially at night.  Stated worse when lying down to bed and upon awakening in the morning. She stated she took last steroid pill this morning.  Mucous color dark green.  Denied fever/chills.  Patient believes she has lost 6 pounds during this illness.  Decreased appetite.  Laryngitis has resolved.  Mild nonproductive cough during phone call x 1.  Spoke full sentences without difficulty.  Taking nyquil at night.  Patient using allegra, saline and nasonex.  She tried honey and it did not help with cough.  Using cough drops with honey.  Taking a hot bath prior to bed.  Discussed mucinex  '600mg'$  po twice a day can help thin mucous and recommended am dosing not prior to bed.  She stated she will have husband pick up OTC. Cough waking her up twice per night and having a hard time falling asleep electronic Rx phenergan 6.'25mg'$ /58m po every 6 hours as needed for cough/rhinitis #120 RF0.  Patient notified it will make her drowsy no driving within 8 hours and I don't recommend taking after midnight if she has to work in the morning. Patient asked if she should eat anything specific for fatigue discussed protein to help keep blood sugars level instead of pure carbs which spike blood sugar levels. Consider chicken noodle soup, fruits and vegetables and hydrating to keep urine pale yellow and clear with voicing. Discussed humidifier use at bedside.  Discussed with patient RN HHildred Alaminor I will follow up with her later this week via telephone.  Patient A&Ox3.  No congestion/throat clearing audible during 11 minute telephone call.  Patient verbalized understanding information/instructions, agreed with plan of care and had no further questions at this time.

## 2021-01-09 NOTE — Telephone Encounter (Signed)
Muscle spasms have resolved.  Encounter closed.  See encounter 01/02/21

## 2021-01-10 MED ORDER — ALBUTEROL SULFATE 108 (90 BASE) MCG/ACT IN AEPB: 1.0000 | INHALATION_SPRAY | RESPIRATORY_TRACT | 0 refills | Status: AC | PRN

## 2021-01-10 MED ORDER — BENZONATATE 200 MG PO CAPS: 200.0000 mg | ORAL_CAPSULE | Freq: Three times a day (TID) | ORAL | 0 refills | Status: DC | PRN

## 2021-01-10 NOTE — Telephone Encounter (Signed)
Reviewed RN Hildred Alamin note agreed with plan of care electronic Rx signed and sent to patient pharmacy of choice.  Noted promethazine did not give any relief.

## 2021-01-10 NOTE — Telephone Encounter (Signed)
Pt in to clinic reporting cough keeping her up at night. Promethazine did not help. Cough is productive with green mucus. She reports NP Otila Kluver told her an inhaler could be sent in if the other meds did not help. Pt stated she was unsure if she was wheezing. Lungs CTA bilaterally though some congestion can be heard when speaking or clearing throat. Frequent small coughs while in clinic. Also sent Tessalon to try instead of promethazine at night. Pt agreeable and denies further questions or concerns.

## 2021-01-11 ENCOUNTER — Other Ambulatory Visit: Payer: Self-pay | Admitting: Family Medicine

## 2021-01-11 NOTE — Telephone Encounter (Signed)
RN Hildred Alamin contacted me regarding patient symptom update after starting tessalon pearles and albuterol inhaler sleep quality improved.  Symptoms still occurring but at times improved.  Will follow up with patient this weekend via telephone.

## 2021-01-11 NOTE — Telephone Encounter (Signed)
Spoke with pt by phone. She reports "feeling like a real person" today after being able to sleep some last night. Thanks staff for new meds as they did provide some overnight relief. She still has a cough today and sts chest feels tight or loose depending on what med she has recently taken. Voice less hoarse, almost back to baseline today. Still with small coughs while speaking but less frequent during 4 minute phone call. She is hoping for additional rest and improvement this weekend. Denies further questions or concerns.

## 2021-01-13 NOTE — Telephone Encounter (Signed)
Patient contacted via telephone spoke full sentences without difficulty A&Ox3 no cough/throat clearing or congestion noted during 4 minute telephone call.  Patient reported feeling better.  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  Last week she only worked 20 hours due to fatigue/coughing.  1/2 days Monday Wednesday Thursday and full day Friday no work Tuesday.  Friday was really tough/wiped her out. She stated slept well this weekend with dayquil/nyquil/tessalon pearles and albuterol inhaler use only when protracted coughing used 3 times. Discussed with patient albuterol helps to relax bronchiole muscles/spasms and stop protracted coughing spells.  Discussed with patient to listen to her body regarding work hours this week; avoid overtime/staying late if fatigued/drained.  Nap after work if needed.  Pace her activities.   Discussed with patient to hydrate, eat regular meals with protein to keep blood sugars stable.  She is concerned she missed a day of antibiotic for UTI so doubled up next dose and has been drinking regular cranberry juice (sweetened) but diluting down with water.  Discussed with patient it is high in sugar and caution with large portions of cranberry juice consider unsweetened due to her diabetes.  Patient notified RN Hildred Alamin onsite M, Tu, Th, Fri and I will be onsite Tu/Th this week.  Patient verbalized understanding information/instructions, agreed with plan of care and had no further questions at this time.

## 2021-01-19 ENCOUNTER — Telehealth: Payer: Self-pay | Admitting: Registered Nurse

## 2021-01-19 ENCOUNTER — Encounter: Payer: Self-pay | Admitting: Registered Nurse

## 2021-01-19 DIAGNOSIS — R300 Dysuria: Secondary | ICD-10-CM

## 2021-01-19 MED ORDER — CIPROFLOXACIN HCL 250 MG PO TABS: 250.0000 mg | ORAL_TABLET | Freq: Two times a day (BID) | ORAL | 0 refills | Status: AC

## 2021-01-19 NOTE — Telephone Encounter (Signed)
Patient contacted NP having UTI symptoms not improved with increased water intake and continuing macrobid '100mg'$  po daily.  Denied fever/chills/n/v/d/tea/cola colored urine/hand/feet swelling.  Some back pain and burning with urination.  Today urine smells but is not cloudy.  Last urine culture grew multi genitourinary.  Patient wondering if restarting topical steroid cream for lichen sclerosis added to problem this week.  Allergic to Sulfa drugs.  Has had penicillin and keflex in the previous 60 days.  Tolerated ciprofloxacin in 2021 without difficulty.  Stop macrobid and hold atorvastatin while on ciprofloxacin '250mg'$  po BID x 3 days #6 RF0 sent to patient pharmacy of her choice. Discussed ciprofloxacin can counteract metformin therefore watch added sugar intake over the next 3 days.  Elevated atorvastatin blood levels per [facility] also. Continue to push water.  Go to ER re-evaluation if worsening abdomen/back pain, unable to urinate every 8 hours, repetitive vomiting, gross hematuria, tea/cola colored urine or fever/chills.  Patient to see RN Hildred Alamin Monday for urine culture and dipstick urinalysis sample.  Keep scheduled appt with urology 07 Feb 2021.  Patient verbalized understanding information/instructions, agreed with plan of care and had no further questions at this time.  Patient A&Ox3 spoke full sentences without difficulty no cough/throat clearing or nasal congestion during 11 minute telephone call.

## 2021-01-20 NOTE — Telephone Encounter (Signed)
Patient contacted via telephone stated feeling better.  Burning with urination worsened yesterday afternoon while she was waiting for her medicine to be ready at pharmacy but improved today and feeling better. Took first dose 1800 yesterday and on 10/1798 schedule for administration Day 2 today. Urine does not have smell now.  Pale yellow clear.  She is drinking a lot of water and has no questions or concerns at this time.  She will follow up with RN Hildred Alamin tomorrow to give her urine sample.  Discussed RN Hildred Alamin has meeting [phone] tomorrow with HR.  Patient verbalized understanding information/instructions, agreed with plan of care and had no further questions at this time.

## 2021-01-21 NOTE — Telephone Encounter (Signed)
Pt in to clinic and provided urine sample for culture send out. Pt denies current sx. Sts after she started Cipro, urinary burning, pressure, and urine odor subsided. Urine culture sample provided.

## 2021-01-21 NOTE — Telephone Encounter (Signed)
Noted urine culture sent today to [place].

## 2021-01-23 NOTE — Telephone Encounter (Signed)
Urine culture results still pending

## 2021-01-24 NOTE — Telephone Encounter (Signed)
Patient notified via telephone urine culture results still pending.  She stated symptoms improved and no further questions or concerns at this time.

## 2021-01-25 LAB — URINE CULTURE

## 2021-01-26 NOTE — Telephone Encounter (Signed)
My chart message results message sent to patient lactobacillus less than 25,000 no further treatment required.  Continue to drink water and use creams for lichen sclerosis.  Follow up for re-evaluation if hematuria gross, fever/chills, worsening back pain, tea/cola colored urine.

## 2021-02-12 ENCOUNTER — Telehealth: Payer: Self-pay | Admitting: Registered Nurse

## 2021-02-12 ENCOUNTER — Encounter: Payer: Self-pay | Admitting: Registered Nurse

## 2021-02-12 DIAGNOSIS — Z87440 Personal history of urinary (tract) infections: Secondary | ICD-10-CM

## 2021-02-12 NOTE — Telephone Encounter (Signed)
Patient seen in warehouse stated was symptom free for 2 weeks after ciprofloxacin 01/19/21 then symptoms reoccurred and she was due appt with urology so followed up with her specialist.  She stated urine culture positive so started on cephalexin BID and then told to take one day for preventive.  Patient stated she was also told to take Vitamin C, cranberry pills, d-manucin?, and azo daily also.  She may have scope after infection is cleared as urology thinking recurrence may be related to kidney stone.  Specialist does not use epic and unable to see labs/encounter notes.

## 2021-02-13 ENCOUNTER — Telehealth: Payer: Self-pay | Admitting: Registered Nurse

## 2021-02-13 NOTE — Telephone Encounter (Signed)
Patient asking if she can still have influenza and covid booster vaccines on 4 Oct since she will still be taking prophylactic keflex for UTIs daily.  Discussed with patient as long as no fever/chills/vomiting she is cleared for vaccination.  Patient verbalized understanding information/instructions and had no further questions at this time.

## 2021-02-16 ENCOUNTER — Other Ambulatory Visit: Payer: Self-pay | Admitting: Family Medicine

## 2021-02-18 ENCOUNTER — Other Ambulatory Visit: Payer: Self-pay | Admitting: Family Medicine

## 2021-02-19 NOTE — Telephone Encounter (Signed)
Pt is due for her CPE after 03/07/21, please schedule then route back to me to refill meds. Thanks

## 2021-02-19 NOTE — Telephone Encounter (Signed)
Patient has been seen in warehouse and URI symptoms resolved and fatigue improved no further concerns  Spoke full sentences today without cough/congestion/throat clearing A&Ox3 respirations even and unlabored RA skin warm dry and pink gait sure and steady.  Patient wearing surgical mask in warehouse.

## 2021-02-19 NOTE — Telephone Encounter (Signed)
I refilled once  Please schedule appt

## 2021-02-19 NOTE — Telephone Encounter (Signed)
Patient seen in warehouse today reported asymptomatic and continuing daily prophylaxis regimen prescribed by urology with vitamin C, d - manose, cranberry, antibiotic.  Feeling well and no concerns at this time.

## 2021-02-20 NOTE — Telephone Encounter (Signed)
Called patient and left vm.

## 2021-02-20 NOTE — Telephone Encounter (Signed)
Please scheduled CPE will refill once

## 2021-02-28 ENCOUNTER — Other Ambulatory Visit: Payer: Self-pay | Admitting: Family Medicine

## 2021-03-18 ENCOUNTER — Other Ambulatory Visit: Payer: Self-pay | Admitting: Family Medicine

## 2021-03-18 ENCOUNTER — Ambulatory Visit: Payer: No Typology Code available for payment source | Admitting: Family Medicine

## 2021-04-15 ENCOUNTER — Other Ambulatory Visit: Payer: Self-pay | Admitting: Family Medicine

## 2021-04-16 ENCOUNTER — Telehealth: Payer: Self-pay | Admitting: Registered Nurse

## 2021-04-16 DIAGNOSIS — Z87440 Personal history of urinary (tract) infections: Secondary | ICD-10-CM

## 2021-04-16 DIAGNOSIS — B370 Candidal stomatitis: Secondary | ICD-10-CM

## 2021-04-16 MED ORDER — FLUCONAZOLE 150 MG PO TABS: ORAL_TABLET | ORAL | 0 refills | Status: DC

## 2021-04-17 ENCOUNTER — Encounter: Payer: Self-pay | Admitting: Registered Nurse

## 2021-04-17 NOTE — Telephone Encounter (Signed)
Patient left message for NP she is starting to get oral thrush--tiny blisters on tongue and gums again.  Denied fever/chills/cough/post nasal drip/dental pain.  During previous outbreak of same rash had vaginal yeast treated with diflucan and oral lesions resolved also.  Patient would like repeat diflucan 150mg  x 1 now by mouth and may repeat in 72 hours if needed #2 RF0 electronic Rx sent to her pharmacy of choice.  Discussed clotrimazole troches and patient refused.  Patient has follow up with urology next week.  Still on daily prophylactic cephalexin for UTIs and has not had reoccurrence of UTI symptoms since 02/12/21  Patient denied dyspnea/dyphagia.  A&Ox3 spoke full sentences without difficulty respirations even and unlabored. No audible cough/wheezing/throat clearing or nasal congestion during 5 minute telephone call.  Discussed clinic closed through holiday weekend and RN Hildred Alamin on vacation through 11/28.  RN and NP back in clinic 04/23/21.  Patient to [email or web address] or call [phone] during clinic closure if questions or concerns over holiday weekend. Patient verbalized understanding information/instructions, agreed with plan of care and had no further questions at this time.

## 2021-05-11 LAB — HM MAMMOGRAPHY

## 2021-05-21 ENCOUNTER — Other Ambulatory Visit: Payer: Self-pay | Admitting: Family Medicine

## 2021-05-29 ENCOUNTER — Telehealth: Payer: Self-pay | Admitting: Family Medicine

## 2021-05-29 NOTE — Telephone Encounter (Signed)
CPE is on 07/18/21, please write lab orders and I will fax when completed

## 2021-05-29 NOTE — Telephone Encounter (Signed)
Laurie Tran called in to change her CPE, and wanted to get a order for labs due to she works for [employer] and they can do the labs there just need an order. Fax number [phone]

## 2021-06-03 NOTE — Telephone Encounter (Signed)
Done and in IN box 

## 2021-06-03 NOTE — Telephone Encounter (Signed)
Orders faxed and pt notified

## 2021-06-05 ENCOUNTER — Encounter: Payer: No Typology Code available for payment source | Admitting: Family Medicine

## 2021-06-27 ENCOUNTER — Ambulatory Visit: Payer: Self-pay | Admitting: Registered Nurse

## 2021-06-27 ENCOUNTER — Other Ambulatory Visit: Payer: Self-pay

## 2021-06-27 VITALS — BP 153/85 | HR 88 | Temp 97.8°F

## 2021-06-27 DIAGNOSIS — R351 Nocturia: Secondary | ICD-10-CM

## 2021-06-27 DIAGNOSIS — Z87440 Personal history of urinary (tract) infections: Secondary | ICD-10-CM

## 2021-06-27 LAB — POCT URINALYSIS DIPSTICK
Bilirubin, UA: NEGATIVE
Glucose, UA: NEGATIVE mg/dL
Ketones, POC UA: NEGATIVE mg/dL
Nitrite, UA: NEGATIVE
Protein, UA: NEGATIVE
Specific Gravity, UA: 1.02 (ref 1.005–1.030)
Urobilinogen, UA: 0.2 E.U./dL
pH, UA: 6.5 (ref 5.0–8.0)

## 2021-06-27 NOTE — Progress Notes (Signed)
Subjective:    Patient ID: Laurie Tran, female    DOB: December 31, 1945, 76 y.o.   MRN: [id number]  75y/o Caucasian married established female pt presenting requesting eval of uti sx again. Urologist unsure how to move forward with her treatment as continuing symptoms on cephalexin 250mg  and cannot tolerate 500mg  po daily as develops yeast infections Pt reports recurring sx currently and hesitant to restart another 5 days of 500mg  cephalexin and questioning if labs are able to tell if bacteria is becoming resistant to current abx treatment.  Patient has had nocturia more than usual.  Still taking her probiotics and trying to increase her water intake.  Last urology visit was told she has kidney stones on right scan and chronic bacteria in her urine.  Unable to view urology office notes/labs in Epic as provider does not utilize this charting system.  Patient here for second opinion today.  Denied abdomen/back pain, hand/feet swelling, fever, chills, n/v/d or headache.  Patient is still using her hormonal cream around urethra also.  She has switched to cotton underwear.  Stopped baths and taking showers.  Avoiding pool and hot tubs.  She is wondering if literature/research has shown covid vaccines/infections weakening immune system and this could be causing her frequent UTIs also.     Review of Systems  Constitutional:  Negative for activity change, appetite change, chills, diaphoresis, fatigue and fever.  HENT:  Negative for trouble swallowing and voice change.   Eyes:  Negative for photophobia and visual disturbance.  Respiratory:  Negative for cough, shortness of breath, wheezing and stridor.   Cardiovascular:  Negative for leg swelling.  Gastrointestinal:  Negative for abdominal pain, diarrhea, nausea and vomiting.  Endocrine: Negative for cold intolerance and heat intolerance.  Genitourinary:  Positive for dysuria and frequency. Negative for decreased urine volume, difficulty urinating, flank  pain, genital sores, hematuria, menstrual problem, pelvic pain, urgency, vaginal bleeding, vaginal discharge and vaginal pain.  Musculoskeletal:  Negative for back pain, gait problem, neck pain and neck stiffness.  Skin:  Negative for rash.  Allergic/Immunologic: Positive for environmental allergies, food allergies and immunocompromised state.  Neurological:  Negative for dizziness, tremors, seizures, syncope, facial asymmetry, speech difficulty, weakness, light-headedness, numbness and headaches.  Hematological:  Negative for adenopathy. Does not bruise/bleed easily.  Psychiatric/Behavioral:  Positive for sleep disturbance. Negative for agitation and confusion.       Objective:   Physical Exam Vitals and nursing note reviewed.  Constitutional:      General: She is awake. She is not in acute distress.    Appearance: Normal appearance. She is well-developed, well-groomed and normal weight. She is not ill-appearing, toxic-appearing or diaphoretic.  HENT:     Head: Normocephalic and atraumatic.     Jaw: There is normal jaw occlusion.     Salivary Glands: Right salivary gland is not diffusely enlarged. Left salivary gland is not diffusely enlarged.     Right Ear: Hearing and external ear normal.     Left Ear: Hearing and external ear normal.     Nose: Nose normal. No congestion or rhinorrhea.     Mouth/Throat:     Lips: Pink. No lesions.     Mouth: Mucous membranes are moist.     Dentition: No gum lesions.     Tongue: No lesions.     Pharynx: Oropharynx is clear. Uvula midline.  Eyes:     General: Lids are normal. Vision grossly intact. Gaze aligned appropriately. Allergic shiner present. No scleral icterus.  Right eye: No discharge.        Left eye: No discharge.     Extraocular Movements: Extraocular movements intact.     Conjunctiva/sclera: Conjunctivae normal.     Pupils: Pupils are equal, round, and reactive to light.  Neck:     Trachea: Trachea and phonation normal. No  tracheal deviation.  Cardiovascular:     Rate and Rhythm: Normal rate and regular rhythm.     Pulses:          Radial pulses are 2+ on the right side and 2+ on the left side.     Heart sounds: Normal heart sounds, S1 normal and S2 normal.  Pulmonary:     Effort: Pulmonary effort is normal. No respiratory distress.     Breath sounds: Normal breath sounds and air entry. No stridor or transmitted upper airway sounds. No wheezing or rales.     Comments: Spoke full sentences without difficulty; no cough observed in exam room; wearing mask surgical Abdominal:     General: Abdomen is flat. Bowel sounds are decreased. There is no distension.     Palpations: Abdomen is soft. There is no fluid wave, mass or pulsatile mass.     Tenderness: There is no abdominal tenderness. There is no right CVA tenderness, left CVA tenderness, guarding or rebound. Negative signs include Murphy's sign.     Hernia: No hernia is present.     Comments: Dull to percussion x 4 quads; hypoactive bowel sounds x 4 quads; standing to sitting to prone and reversed quickly on exam table without assistance  Musculoskeletal:        General: Normal range of motion.     Cervical back: Normal range of motion and neck supple. No edema, erythema, signs of trauma, rigidity or crepitus. No pain with movement. Normal range of motion.     Right lower leg: No edema.     Left lower leg: No edema.  Lymphadenopathy:     Head:     Right side of head: No submandibular or preauricular adenopathy.     Left side of head: No submandibular or preauricular adenopathy.     Cervical:     Right cervical: No superficial cervical adenopathy.    Left cervical: No superficial cervical adenopathy.  Skin:    General: Skin is warm and dry.     Capillary Refill: Capillary refill takes less than 2 seconds.     Coloration: Skin is not ashen, cyanotic, jaundiced, mottled, pale or sallow.     Findings: No abrasion, bruising, burn, erythema, signs of injury,  laceration, petechiae, rash or wound.     Nails: There is no clubbing.  Neurological:     General: No focal deficit present.     Mental Status: She is alert and oriented to person, place, and time. Mental status is at baseline.     GCS: GCS eye subscore is 4. GCS verbal subscore is 5. GCS motor subscore is 6.     Cranial Nerves: Cranial nerves 2-12 are intact. No cranial nerve deficit, dysarthria or facial asymmetry.     Sensory: Sensation is intact.     Motor: Motor function is intact. No weakness, tremor, atrophy, abnormal muscle tone or seizure activity.     Coordination: Coordination is intact. Coordination normal.     Gait: Gait is intact. Gait normal.     Comments: In/out of chair and on/off exam table without difficulty; gait sure and steady in clinic; bilateral hand grasp equal 5/5  Psychiatric:        Attention and Perception: Attention and perception normal.        Mood and Affect: Mood and affect normal.        Speech: Speech normal.        Behavior: Behavior normal. Behavior is cooperative.        Thought Content: Thought content normal.        Cognition and Memory: Cognition and memory normal.        Judgment: Judgment normal.     Trace blood, small leukocytes and cloudy urine sample clean catch today. Discussed with patient bacteria in urine again today but cannot tell which until urine culture results available. Discussed with patient increase water intake as specific gravity mid range.  Urine culture sent to [facility] today will send my chart message with results once available.  Continue her cephalexin 250mg  po daily and increase to 500mg  po BID x 5 days if worsening symptoms despite increasing water intake.  Follow up with urology if no improvement in symptoms  with cephalexin 500mg  po BID while waiting for urine culture results.  Patient verbalized understanding information/instructions, agreed with plan of care and had no further questions at this time.     Assessment &  Plan:  A-nocturia greater than 2 times per night, history of recurrent UTIs and kidney stones  P-Trace blood, small leukocytes and cloudy urine sample clean catch today. Discussed with patient bacteria in urine again today but cannot tell which until urine culture results available along with if there is any drug resistant bacteria. Discussed with patient increase water intake as specific gravity mid range.  Urine culture sent to [facility] today will send my chart message with results once available.  Continue her cephalexin 250mg  po daily and increase to 500mg  po BID x 5 days if worsening symptoms despite increasing water intake.  Discussed with patient I will review covid research literature this weekend to see if any UTI research has been performed int he past 2 years.  Discussed with patient her diabetes, postmenopausal, kidney stones increase risk of recurrent UTI.  Follow up with urology if no improvement in symptoms  with cephalexin 500mg  po BID while waiting for urine culture results.  Continue her estrace cream topical along with supplements recommended by urology for kidney stones/UTIs.  Discussed fermented foods with patient to help with gut microbiome health due to chronic antibiotic use.  Discussed sauerkraut, pickles, pickled vegetables, kimchi, tofu, yogurt with active cultures (she does not like yogurt), kefir, and komboucha  Patient verbalized understanding information/instructions, agreed with plan of care and had no further questions at this time.

## 2021-06-29 ENCOUNTER — Encounter: Payer: Self-pay | Admitting: Registered Nurse

## 2021-06-29 ENCOUNTER — Telehealth: Payer: Self-pay | Admitting: Registered Nurse

## 2021-06-29 DIAGNOSIS — B370 Candidal stomatitis: Secondary | ICD-10-CM

## 2021-06-29 LAB — URINE CULTURE

## 2021-06-29 MED ORDER — FLUCONAZOLE 150 MG PO TABS: ORAL_TABLET | ORAL | 0 refills | Status: DC

## 2021-06-29 NOTE — Telephone Encounter (Signed)
Patient contacted and notified urine culture did not show infection only bacteria that typically grows when urine hits skin during the collection of clean catch.  Patient stated she has been taking cephalexin 500mg  po BID and will decrease back to 250mg  po daily.  Denied worsening symptoms but has developed oral thrust from antibiotics.  Last fluconazole use Dec 2022 per patient from urology.  I last prescribed in Nov 2022 for patient and typically she needs 2 doses.  Electronic Rx diflucan 150mg  po now and may repeat in 72 hours if symptoms persist #2 RF0 sent to her pharmacy of choice.  Discussed with patient if new or worsening symptoms to come to clinic to repeat urinalysis and urine culture.  Patient verbalized understanding information/instructions, agreed with plan of care and had no further questions at this time.

## 2021-07-06 ENCOUNTER — Other Ambulatory Visit: Payer: Self-pay | Admitting: Family Medicine

## 2021-07-08 ENCOUNTER — Other Ambulatory Visit: Payer: Self-pay

## 2021-07-08 ENCOUNTER — Ambulatory Visit: Payer: Self-pay | Admitting: *Deleted

## 2021-07-08 VITALS — BP 132/84 | HR 73 | Ht 63.0 in | Wt 122.0 lb

## 2021-07-08 DIAGNOSIS — Z0000 Encounter for general adult medical examination without abnormal findings: Secondary | ICD-10-CM

## 2021-07-08 NOTE — Progress Notes (Signed)
[employer] insurance premium discount evaluation: Labs Drawn. Replacements ROI form signed. Tobacco Free Attestation form signed.  Forms placed in paper chart.  

## 2021-07-09 ENCOUNTER — Encounter: Payer: Self-pay | Admitting: Registered Nurse

## 2021-07-09 LAB — CMP12+LP+TP+TSH+6AC+CBC/D/PLT
ALT: 15 IU/L (ref 0–32)
AST: 24 IU/L (ref 0–40)
Albumin/Globulin Ratio: 2 (ref 1.2–2.2)
Albumin: 4.5 g/dL (ref 3.7–4.7)
Alkaline Phosphatase: 112 IU/L (ref 44–121)
BUN/Creatinine Ratio: 22 (ref 12–28)
BUN: 20 mg/dL (ref 8–27)
Basophils Absolute: 0 10*3/uL (ref 0.0–0.2)
Basos: 1 %
Bilirubin Total: 0.5 mg/dL (ref 0.0–1.2)
Calcium: 9.9 mg/dL (ref 8.7–10.3)
Chloride: 100 mmol/L (ref 96–106)
Chol/HDL Ratio: 2.9 ratio (ref 0.0–4.4)
Cholesterol, Total: 164 mg/dL (ref 100–199)
Creatinine, Ser: 0.93 mg/dL (ref 0.57–1.00)
EOS (ABSOLUTE): 0.2 10*3/uL (ref 0.0–0.4)
Eos: 2 %
Estimated CHD Risk: <0.5 times avg. (ref 0.0–1.0)
Free Thyroxine Index: 2.2 (ref 1.2–4.9)
GGT: 5 IU/L (ref 0–60)
Globulin, Total: 2.3 g/dL (ref 1.5–4.5)
Glucose: 89 mg/dL (ref 70–99)
HDL: 56 mg/dL (ref >39)
Hematocrit: 40.2 % (ref 34.0–46.6)
Hemoglobin: 13.5 g/dL (ref 11.1–15.9)
Immature Grans (Abs): 0 10*3/uL (ref 0.0–0.1)
Immature Granulocytes: 0 %
Iron: 142 ug/dL — ABNORMAL HIGH (ref 27–139)
LDH: 187 IU/L (ref 119–226)
LDL Chol Calc (NIH): 86 mg/dL (ref 0–99)
Lymphocytes Absolute: 2.3 10*3/uL (ref 0.7–3.1)
Lymphs: 31 %
MCH: 28.7 pg (ref 26.6–33.0)
MCHC: 33.6 g/dL (ref 31.5–35.7)
MCV: 85 fL (ref 79–97)
Monocytes Absolute: 0.4 10*3/uL (ref 0.1–0.9)
Monocytes: 5 %
Neutrophils Absolute: 4.5 10*3/uL (ref 1.4–7.0)
Neutrophils: 61 %
Phosphorus: 3.6 mg/dL (ref 3.0–4.3)
Platelets: 257 10*3/uL (ref 150–450)
Potassium: 4 mmol/L (ref 3.5–5.2)
RBC: 4.71 x10E6/uL (ref 3.77–5.28)
RDW: 13 % (ref 11.7–15.4)
Sodium: 139 mmol/L (ref 134–144)
T3 Uptake Ratio: 24 % (ref 24–39)
T4, Total: 9 ug/dL (ref 4.5–12.0)
TSH: 3.78 u[IU]/mL (ref 0.450–4.500)
Total Protein: 6.8 g/dL (ref 6.0–8.5)
Triglycerides: 126 mg/dL (ref 0–149)
Uric Acid: 4.2 mg/dL (ref 3.1–7.9)
VLDL Cholesterol Cal: 22 mg/dL (ref 5–40)
WBC: 7.4 10*3/uL (ref 3.4–10.8)
eGFR: 65 mL/min/{1.73_m2} (ref >59)

## 2021-07-09 LAB — HGB A1C W/O EAG: Hgb A1c MFr Bld: 6 % — ABNORMAL HIGH (ref 4.8–5.6)

## 2021-07-09 NOTE — Addendum Note (Signed)
Addended by: Gerarda Fraction A on: 07/09/2021 06:58 AM   Modules accepted: Orders

## 2021-07-11 ENCOUNTER — Other Ambulatory Visit: Payer: Self-pay | Admitting: Family Medicine

## 2021-07-18 ENCOUNTER — Encounter: Payer: Self-pay | Admitting: Family Medicine

## 2021-07-18 ENCOUNTER — Ambulatory Visit (INDEPENDENT_AMBULATORY_CARE_PROVIDER_SITE_OTHER): Payer: No Typology Code available for payment source | Admitting: Family Medicine

## 2021-07-18 ENCOUNTER — Other Ambulatory Visit: Payer: Self-pay

## 2021-07-18 VITALS — BP 118/78 | HR 82 | Temp 97.8°F | Ht 63.5 in | Wt 127.0 lb

## 2021-07-18 DIAGNOSIS — I10 Essential (primary) hypertension: Secondary | ICD-10-CM | POA: Diagnosis not present

## 2021-07-18 DIAGNOSIS — E118 Type 2 diabetes mellitus with unspecified complications: Secondary | ICD-10-CM

## 2021-07-18 DIAGNOSIS — D126 Benign neoplasm of colon, unspecified: Secondary | ICD-10-CM | POA: Diagnosis not present

## 2021-07-18 DIAGNOSIS — Z23 Encounter for immunization: Secondary | ICD-10-CM

## 2021-07-18 DIAGNOSIS — Z0000 Encounter for general adult medical examination without abnormal findings: Secondary | ICD-10-CM

## 2021-07-18 DIAGNOSIS — E2839 Other primary ovarian failure: Secondary | ICD-10-CM

## 2021-07-18 DIAGNOSIS — E1169 Type 2 diabetes mellitus with other specified complication: Secondary | ICD-10-CM

## 2021-07-18 DIAGNOSIS — E785 Hyperlipidemia, unspecified: Secondary | ICD-10-CM

## 2021-07-18 MED ORDER — METFORMIN HCL 500 MG PO TABS: ORAL_TABLET | ORAL | 3 refills | Status: DC

## 2021-07-18 MED ORDER — HYDROCHLOROTHIAZIDE 25 MG PO TABS: 25.0000 mg | ORAL_TABLET | Freq: Every day | ORAL | 3 refills | Status: DC

## 2021-07-18 MED ORDER — FLUCONAZOLE 150 MG PO TABS: ORAL_TABLET | ORAL | 0 refills | Status: DC

## 2021-07-18 NOTE — Progress Notes (Signed)
Subjective:    Patient ID: Laurie Tran, female    DOB: 1946-01-15, 76 y.o.   MRN: [id number]  This visit occurred during the SARS-CoV-2 public health emergency.  Safety protocols were in place, including screening questions prior to the visit, additional usage of staff PPE, and extensive cleaning of exam room while observing appropriate contact time as indicated for disinfecting solutions.   HPI Here for health maintenance exam and to review chronic medical problems   Wt Readings from Last 3 Encounters:  07/18/21 127 lb (57.6 kg)  07/08/21 122 lb (55.3 kg)  03/07/20 132 lb 4 oz (60 kg)   22.14 kg/m  Doing well  Going to [place] this year -very excited /visiting someone  Has been there before   Exercising more  Recumbent bike and a rowing machine  Also a lot of gardening   Zoster status: will check on coverage  Covid immunized  Tdap 05/2011 Flu shot 02/2021  Mammogram 04/2021 Mother had breast cancer  Self breast exam: no lumps or changes   Colonoscopy 10/2017   Dexa 02/2017 -normal   Supplements-vitamin D   No falls or fractures    HTN bp is stable today  No cp or palpitations or headaches or edema  No side effects to medicines  BP Readings from Last 3 Encounters:  07/18/21 118/78  07/08/21 132/84  06/27/21 (!) 153/85     Pulse Readings from Last 3 Encounters:  07/18/21 82  07/08/21 73  06/27/21 88   Hctz 25 mg daily  Klor con  Lab Results  Component Value Date   CREATININE 0.93 07/08/2021   BUN 20 07/08/2021   NA 139 07/08/2021   K 4.0 07/08/2021   CL 100 07/08/2021   CO2 26 11/26/2018   Lab Results  Component Value Date   ALT 15 07/08/2021   AST 24 07/08/2021   GGT 5 07/08/2021   ALKPHOS 112 07/08/2021   BILITOT 0.5 07/08/2021      DM2 Lab Results  Component Value Date   HGBA1C 6.0 (H) 07/08/2021  Stable/no change   Metformin 500 mg once daily  Taking a statin  No ace due to past anaphylaxis with another medication  Eye exam   11/2020  Microalb-unable to urinate today    Eye exam:  up to date No retinopathy  Had cataract surgery    Hyperlipidemia Lab Results  Component Value Date   CHOL 164 07/08/2021   CHOL 159 02/24/2020   CHOL 156 02/17/2019   Lab Results  Component Value Date   HDL 56 07/08/2021   HDL 55 02/24/2020   HDL 50 02/17/2019   Lab Results  Component Value Date   LDLCALC 86 07/08/2021   LDLCALC 89 02/24/2020   LDLCALC 89 02/17/2019   Lab Results  Component Value Date   TRIG 126 07/08/2021   TRIG 76 02/24/2020   TRIG 89 02/17/2019   Lab Results  Component Value Date   CHOLHDL 2.9 07/08/2021   CHOLHDL 2.9 02/24/2020   CHOLHDL 3.1 02/17/2019   No results found for: LDLDIRECT  Atorvastatin 10 mg daily Diet is good after the holidays  Avoids fried food and red meat   Lab Results  Component Value Date   WBC 7.4 07/08/2021   HGB 13.5 07/08/2021   HCT 40.2 07/08/2021   MCV 85 07/08/2021   PLT 257 07/08/2021   Lab Results  Component Value Date   TSH 3.780 07/08/2021   Her iron level was high Has  well water  Took a lot of vit C   Lab Results  Component Value Date   IRON 142 (H) 07/08/2021   No supplements with iron   Has occ yeast infection from the cephalexin prn   Patient Active Problem List   Diagnosis Date Noted   Kidney stone 08/26/2020   Cotton wool spots 12/26/2019   Eye exam abnormal 12/26/2019   Ureteral calculus 11/25/2018   Encounter for hepatitis C screening test for low risk patient 01/19/2017   Estrogen deficiency 40/81/4481   Lichen sclerosus 85/63/1497   H/O herpes zoster 11/13/2011   Routine general medical examination at a health care facility 05/30/2011   Other screening mammogram 05/30/2011   Colon polyps 05/30/2011   Type 2 diabetes mellitus with complication, without long-term current use of insulin ([place]) 10/29/2010   GERD (gastroesophageal reflux disease) 10/29/2010   HTN (hypertension) 10/29/2010   Hyperlipidemia associated with  type 2 diabetes mellitus ([place]) 10/29/2010   History of kidney stones 10/29/2010   Perennial allergic rhinitis 10/29/2010   Past Medical History:  Diagnosis Date   Allergic rhinitis    Frequency of urination    GERD (gastroesophageal reflux disease)    Hematuria    History of echocardiogram    in epic 12-22-2006  mild AV sclerosis without stenosis,  normal LVF, ef 55-60%,  trivial TR   History of gastroesophageal reflux (GERD)    per pt no issues since stopped drinking soda's   History of kidney stones    History of nuclear stress test    in epic 09-25-2005 normal with no ischemia, ef 69%   Hypertension    Nephrolithiasis    bilateral non-obstructive per CT 11-25-2018   Nocturia    OA (osteoarthritis)    Right ureteral stone    Type 2 diabetes mellitus ([place])    followed by pcp   Urgency of urination    Wears glasses    Past Surgical History:  Procedure Laterality Date   BREAST SURGERY  1985   breast biopsy benign   CYSTOSCOPY W/ URETERAL STENT PLACEMENT Right 11/25/2018   Procedure: CYSTOSCOPY WITH RETROGRADE PYELOGRAM/URETERAL STENT PLACEMENT;  Surgeon: Cleon Gustin, MD;  Location: [facility];  Service: Urology;  Laterality: Right;   CYSTOSCOPY WITH RETROGRADE PYELOGRAM, URETEROSCOPY AND STENT PLACEMENT Left 10/21/2016   Procedure: CYSTOSCOPY WITH RETROGRADE PYELOGRAM, LEFT URETEROSCOPY PYELOSCOPY  AND LEFT STENT PLACEMENT;  Surgeon: Carolan Clines, MD;  Location: [facility];  Service: Urology;  Laterality: Left;   CYSTOSCOPY WITH RETROGRADE PYELOGRAM, URETEROSCOPY AND STENT PLACEMENT Right 12/20/2018   Procedure: CYSTOSCOPY WITH RIGHT RETROGRADE URETEROSCOPY WITH LASER  AND STENT REPLACEMENT;  Surgeon: Cleon Gustin, MD;  Location: [facility];  Service: Urology;  Laterality: Right;   CYSTOSCOPY/RETROGRADE/URETEROSCOPY/STONE EXTRACTION WITH BASKET  yrs ago   EXTRACORPOREAL SHOCK WAVE LITHOTRIPSY  01-06-2011   @[facility]    EXTRACORPOREAL SHOCK WAVE LITHOTRIPSY  Right 08/11/2019   Procedure: EXTRACORPOREAL SHOCK WAVE LITHOTRIPSY (ESWL);  Surgeon: Alexis Frock, MD;  Location: [facility];  Service: Urology;  Laterality: Right;   EXTRACORPOREAL SHOCK WAVE LITHOTRIPSY Left 03/01/2020   Procedure: LEFT EXTRACORPOREAL SHOCK WAVE LITHOTRIPSY (ESWL);  Surgeon: Robley Fries, MD;  Location: [facility];  Service: Urology;  Laterality: Left;   HOLMIUM LASER APPLICATION Right 0/26/3785   Procedure: HOLMIUM LASER APPLICATION;  Surgeon: Cleon Gustin, MD;  Location: [facility];  Service: Urology;  Laterality: Right;   KNEE ARTHROSCOPY Right 2006   Social History  Tobacco Use   Smoking status: Never    Passive exposure: Past   Smokeless tobacco: Never  Vaping Use   Vaping Use: Never used  Substance Use Topics   Alcohol use: No    Alcohol/week: 0.0 standard drinks   Drug use: No   Family History  Problem Relation Age of Onset   Cancer Mother        breast CA   Arthritis Paternal Grandmother    Stroke Paternal Grandmother    Diabetes Paternal Grandmother    Cancer Sister    Breast cancer Sister    Colon cancer Brother    Allergies  Allergen Reactions   Bactrim [Sulfamethoxazole-Trimethoprim] Anaphylaxis and Shortness Of Breath    Pt reported tongue swelling with open sores, ShOB, difficulty swallowing   Nsaids Anaphylaxis, Hives and Shortness Of Breath    Breathing problems ;  Per pt "all anti-flammatory's "   Sulfa Antibiotics Anaphylaxis and Shortness Of Breath    Pt reports tongue swelling, ShOB, difficulty swallowing   Celebrex [Celecoxib] Hives and Other (See Comments)    Congestion   Methocarbamol Hives    Bronchitis/congestion/coughing   Mixed Grasses Other (See Comments)   Molds & Smuts Other (See Comments)   Myrbetriq [place]] Other (See Comments)    Severe myalgias   Other Other (See Comments)    "needs to clear throat often with peanuts"   Tree Extract Other (See  Comments)   Vioxx [Rofecoxib] Hives and Other (See Comments)    Congestion   Current Outpatient Medications on File Prior to Visit  Medication Sig Dispense Refill   Albuterol Sulfate (PROAIR RESPICLICK) 001 (90 Base) MCG/ACT AEPB Inhale 1-2 puffs into the lungs every 4 (four) hours as needed. 1 each 0   atorvastatin (LIPITOR) 10 MG tablet TAKE 1 TABLET BY MOUTH EVERY DAY 90 tablet 0   Cephalexin 250 MG tablet Take 250 mg by mouth daily.     Cholecalciferol 25 MCG (1000 UT) capsule Take 1,000 Units by mouth daily.     D-MANNOSE PO Take by mouth.     EPINEPHrine 0.3 mg/0.3 mL IJ SOAJ injection Inject 0.3 mg into the muscle as needed for anaphylaxis.     estradiol (ESTRACE) 0.1 MG/GM vaginal cream As directed by urology     fexofenadine (ALLEGRA) 180 MG tablet TAKE 1 TABLET (180 MG TOTAL) BY MOUTH DAILY. (Patient taking differently: Take 180 mg by mouth daily.) 90 tablet 3   GLUCOSAMINE CHONDROITIN COMPLX PO Take by mouth.     Lactobacillus (PROBIOTIC ACIDOPHILUS PO) Take by mouth.     magnesium oxide (MAG-OX) 400 MG tablet Take 400 mg by mouth daily.     mometasone (NASONEX) 50 MCG/ACT nasal spray Place 2 sprays into the nose daily as needed. (Patient taking differently: Place 2 sprays into the nose daily as needed (allergies).) 17 g 11   Multiple Vitamins-Minerals (PRESERVISION AREDS PO) Take by mouth.     potassium chloride (KLOR-CON) 10 MEQ tablet TAKE 1 TABLET BY MOUTH EVERY DAY 90 tablet 0   triamcinolone cream (KENALOG) 0.1 % Apply 1 application topically 2 (two) times daily as needed.     phenazopyridine (PYRIDIUM) 200 MG tablet Take 200 mg by mouth 3 (three) times daily as needed. (Patient not taking: Reported on 07/18/2021)     No current facility-administered medications on file prior to visit.    Review of Systems  Constitutional:  Negative for activity change, appetite change, fatigue, fever and unexpected weight change.  HENT:  Negative for congestion, ear pain, rhinorrhea, sinus  pressure and sore throat.   Eyes:  Negative for pain, redness and visual disturbance.  Respiratory:  Negative for cough, shortness of breath and wheezing.   Cardiovascular:  Negative for chest pain and palpitations.  Gastrointestinal:  Negative for abdominal pain, blood in stool, constipation and diarrhea.  Endocrine: Negative for polydipsia and polyuria.  Genitourinary:  Negative for dysuria, frequency and urgency.  Musculoskeletal:  Negative for arthralgias, back pain and myalgias.  Skin:  Negative for pallor and rash.  Allergic/Immunologic: Negative for environmental allergies.  Neurological:  Negative for dizziness, syncope and headaches.  Hematological:  Negative for adenopathy. Does not bruise/bleed easily.  Psychiatric/Behavioral:  Negative for decreased concentration and dysphoric mood. The patient is not nervous/anxious.       Objective:   Physical Exam Constitutional:      General: She is not in acute distress.    Appearance: Normal appearance. She is well-developed. She is not ill-appearing or diaphoretic.  HENT:     Head: Normocephalic and atraumatic.     Right Ear: Tympanic membrane, ear canal and external ear normal. There is impacted cerumen.     Left Ear: Tympanic membrane, ear canal and external ear normal. There is impacted cerumen.     Ears:     Comments: Some dry cerumen in both ear canals    Nose: Nose normal. No congestion.     Mouth/Throat:     Mouth: Mucous membranes are moist.     Pharynx: Oropharynx is clear. No posterior oropharyngeal erythema.  Eyes:     General: No scleral icterus.    Extraocular Movements: Extraocular movements intact.     Conjunctiva/sclera: Conjunctivae normal.     Pupils: Pupils are equal, round, and reactive to light.  Neck:     Thyroid: No thyromegaly.     Vascular: No carotid bruit or JVD.  Cardiovascular:     Rate and Rhythm: Normal rate and regular rhythm.     Pulses: Normal pulses.     Heart sounds: Normal heart sounds.     No gallop.  Pulmonary:     Effort: Pulmonary effort is normal. No respiratory distress.     Breath sounds: Normal breath sounds. No wheezing.     Comments: Good air exch Chest:     Chest wall: No tenderness.  Abdominal:     General: Bowel sounds are normal. There is no distension or abdominal bruit.     Palpations: Abdomen is soft. There is no mass.     Tenderness: There is no abdominal tenderness.     Hernia: No hernia is present.  Genitourinary:    Comments: Breast exam: No mass, nodules, thickening, tenderness, bulging, retraction, inflamation, nipple discharge or skin changes noted.  No axillary or clavicular LA.     Musculoskeletal:        General: No tenderness. Normal range of motion.     Cervical back: Normal range of motion and neck supple. No rigidity. No muscular tenderness.     Right lower leg: No edema.     Left lower leg: No edema.     Comments: No kyphosis   Lymphadenopathy:     Cervical: No cervical adenopathy.  Skin:    General: Skin is warm and dry.     Coloration: Skin is not pale.     Findings: No erythema or rash.     Comments: Solar lentigines diffusely   Neurological:     Mental Status: She is  alert. Mental status is at baseline.     Cranial Nerves: No cranial nerve deficit.     Motor: No abnormal muscle tone.     Coordination: Coordination normal.     Gait: Gait normal.     Deep Tendon Reflexes: Reflexes are normal and symmetric. Reflexes normal.  Psychiatric:        Mood and Affect: Mood normal.        Cognition and Memory: Cognition and memory normal.     Comments: Pleasant and talkative           Assessment & Plan:   Problem List Items Addressed This Visit       Cardiovascular and Mediastinum   HTN (hypertension)    bp in fair control at this time  BP Readings from Last 1 Encounters:  07/18/21 118/78  No changes needed Most recent labs reviewed  Disc lifstyle change with low sodium diet and exercise  Plan to continue hctz 25 mg  daily with K      Relevant Medications   hydrochlorothiazide (HYDRODIURIL) 25 MG tablet     Digestive   Colon polyps    Colonoscopy 10/2017         Endocrine   Hyperlipidemia associated with type 2 diabetes mellitus ([place])    Disc goals for lipids and reasons to control them Rev last labs with pt Rev low sat fat diet in detail Excellent diet  Plan to continue atorvastatin 10 mg daily       Relevant Medications   hydrochlorothiazide (HYDRODIURIL) 25 MG tablet   metFORMIN (GLUCOPHAGE) 500 MG tablet   Type 2 diabetes mellitus with complication, without long-term current use of insulin ([facility])    Lab Results  Component Value Date   HGBA1C 6.0 (H) 07/08/2021  Very well controlled Metformin 500 mg daily  Unable to give urine sample-will do next time/microalb since she is not ace candidate On statin  utd eye exam      Relevant Medications   metFORMIN (GLUCOPHAGE) 500 MG tablet     Other   Estrogen deficiency   Relevant Orders   DG Bone Density   Routine general medical examination at a health care facility - Primary    Reviewed health habits including diet and exercise and skin cancer prevention Reviewed appropriate screening tests for age  Also reviewed health mt list, fam hx and immunization status , as well as social and family history   See HPI Labs reviewed  Planning to check coverage of shingrix Td given  Mammogram utd Colonoscopy utd  dexa ordered /no falls or fractures

## 2021-07-18 NOTE — Addendum Note (Signed)
Addended by: Pilar Grammes on: 07/18/2021 10:35 AM   Modules accepted: Orders

## 2021-07-18 NOTE — Assessment & Plan Note (Addendum)
Reviewed health habits including diet and exercise and skin cancer prevention Reviewed appropriate screening tests for age  Also reviewed health mt list, fam hx and immunization status , as well as social and family history   See HPI Labs reviewed  Planning to check coverage of shingrix Td given  Mammogram utd Colonoscopy utd  dexa ordered /no falls or fractures

## 2021-07-18 NOTE — Assessment & Plan Note (Signed)
Lab Results  Component Value Date   HGBA1C 6.0 (H) 07/08/2021   Very well controlled Metformin 500 mg daily  Unable to give urine sample-will do next time/microalb since she is not ace candidate On statin  utd eye exam

## 2021-07-18 NOTE — Assessment & Plan Note (Signed)
bp in fair control at this time  BP Readings from Last 1 Encounters:  07/18/21 118/78   No changes needed Most recent labs reviewed  Disc lifstyle change with low sodium diet and exercise  Plan to continue hctz 25 mg daily with K

## 2021-07-18 NOTE — Assessment & Plan Note (Signed)
Disc goals for lipids and reasons to control them Rev last labs with pt Rev low sat fat diet in detail Excellent diet  Plan to continue atorvastatin 10 mg daily

## 2021-07-18 NOTE — Assessment & Plan Note (Signed)
Colonoscopy 10/2017.   

## 2021-07-18 NOTE — Patient Instructions (Addendum)
If you are interested in the shingles vaccine series (Shingrix), call your insurance or pharmacy to check on coverage and location it must be given.  If affordable - you can schedule it here or at your pharmacy depending on coverage   The next time you are here we will get a urine sample to screen for protein from diabetes   Get your iron re checked in several weeks  Let us know if you need to do it here     Call to schedule a bone density test at [facility]   Please call the location of your choice from the menu below to schedule your Mammogram and/or Bone Density appointment.    [facility]                      Phone:  [phone] [address], [place] [address]                                                             Services: Traditional and 3D Mammogram, [place] Bone Density                 Phone: [phone] [address], [place] [address]    Service: Bone Density ONLY   *this site does NOT perform mammograms  [place]                        Phone:  [phone] [address], [place] [address]                                            Services:  3D Mammogram and [place] at [facility]   Phone:  [phone]   [address], [place] [address]  Services: 3D Mammogram and Bone Density  [place] at [facility])  Phone:  [phone]   [address] Room [place], [place] [address]                                              Services:  3D Mammogram and Bone  Density

## 2021-08-26 ENCOUNTER — Ambulatory Visit: Payer: Self-pay | Admitting: *Deleted

## 2021-08-27 LAB — IRON,TIBC AND FERRITIN PANEL
Ferritin: 35 ng/mL (ref 15–150)
Iron Saturation: 22 % (ref 15–55)
Iron: 71 ug/dL (ref 27–139)
Total Iron Binding Capacity: 329 ug/dL (ref 250–450)
UIBC: 258 ug/dL (ref 118–369)

## 2021-09-09 ENCOUNTER — Other Ambulatory Visit: Payer: Self-pay | Admitting: Urology

## 2021-09-24 ENCOUNTER — Other Ambulatory Visit: Payer: Self-pay

## 2021-09-24 ENCOUNTER — Encounter (HOSPITAL_BASED_OUTPATIENT_CLINIC_OR_DEPARTMENT_OTHER): Payer: Self-pay | Admitting: Urology

## 2021-09-25 ENCOUNTER — Other Ambulatory Visit: Payer: Self-pay

## 2021-09-25 ENCOUNTER — Ambulatory Visit (HOSPITAL_BASED_OUTPATIENT_CLINIC_OR_DEPARTMENT_OTHER): Payer: No Typology Code available for payment source | Admitting: Anesthesiology

## 2021-09-25 ENCOUNTER — Ambulatory Visit (HOSPITAL_BASED_OUTPATIENT_CLINIC_OR_DEPARTMENT_OTHER)
Admission: RE | Admit: 2021-09-25 | Discharge: 2021-09-25 | Disposition: A | Discharge status: Home / Self Care | Payer: No Typology Code available for payment source | Attending: Urology | Admitting: Urology

## 2021-09-25 ENCOUNTER — Encounter (HOSPITAL_BASED_OUTPATIENT_CLINIC_OR_DEPARTMENT_OTHER): Payer: Self-pay | Admitting: Urology

## 2021-09-25 ENCOUNTER — Encounter (HOSPITAL_BASED_OUTPATIENT_CLINIC_OR_DEPARTMENT_OTHER)
Admission: RE | Disposition: A | Discharge status: Home / Self Care | Payer: Self-pay | Source: Home / Self Care | Attending: Urology

## 2021-09-25 DIAGNOSIS — N200 Calculus of kidney: Secondary | ICD-10-CM

## 2021-09-25 DIAGNOSIS — K219 Gastro-esophageal reflux disease without esophagitis: Secondary | ICD-10-CM | POA: Diagnosis not present

## 2021-09-25 DIAGNOSIS — Z87442 Personal history of urinary calculi: Secondary | ICD-10-CM | POA: Insufficient documentation

## 2021-09-25 DIAGNOSIS — Z79890 Hormone replacement therapy (postmenopausal): Secondary | ICD-10-CM | POA: Insufficient documentation

## 2021-09-25 DIAGNOSIS — Z79899 Other long term (current) drug therapy: Secondary | ICD-10-CM | POA: Insufficient documentation

## 2021-09-25 DIAGNOSIS — Z87440 Personal history of urinary (tract) infections: Secondary | ICD-10-CM | POA: Diagnosis not present

## 2021-09-25 DIAGNOSIS — I10 Essential (primary) hypertension: Secondary | ICD-10-CM | POA: Diagnosis not present

## 2021-09-25 DIAGNOSIS — N390 Urinary tract infection, site not specified: Secondary | ICD-10-CM

## 2021-09-25 DIAGNOSIS — E119 Type 2 diabetes mellitus without complications: Secondary | ICD-10-CM | POA: Diagnosis not present

## 2021-09-25 HISTORY — PX: CYSTOSCOPY/URETEROSCOPY/HOLMIUM LASER/STENT PLACEMENT: SHX6546

## 2021-09-25 LAB — POCT I-STAT, CHEM 8
BUN: 16 mg/dL (ref 8–23)
Calcium, Ion: 1.24 mmol/L (ref 1.15–1.40)
Chloride: 102 mmol/L (ref 98–111)
Creatinine, Ser: 0.8 mg/dL (ref 0.44–1.00)
Glucose, Bld: 103 mg/dL — ABNORMAL HIGH (ref 70–99)
HCT: 41 % (ref 36.0–46.0)
Hemoglobin: 13.9 g/dL (ref 12.0–15.0)
Potassium: 4 mmol/L (ref 3.5–5.1)
Sodium: 143 mmol/L (ref 135–145)
TCO2: 30 mmol/L (ref 22–32)

## 2021-09-25 LAB — GLUCOSE, CAPILLARY: Glucose-Capillary: 99 mg/dL (ref 70–99)

## 2021-09-25 SURGERY — CYSTOSCOPY/URETEROSCOPY/HOLMIUM LASER/STENT PLACEMENT
Anesthesia: General | Site: Ureter | Laterality: Right

## 2021-09-25 MED ORDER — OXYBUTYNIN CHLORIDE 5 MG PO TABS: 5.0000 mg | ORAL_TABLET | Freq: Three times a day (TID) | ORAL | 1 refills | Status: DC | PRN

## 2021-09-25 MED ORDER — PROPOFOL 10 MG/ML IV BOLUS
INTRAVENOUS | Status: AC
  Filled 2021-09-25: qty 20

## 2021-09-25 MED ORDER — IOHEXOL 300 MG/ML  SOLN
INTRAMUSCULAR | Status: DC | PRN
  Administered 2021-09-25: 5 mL

## 2021-09-25 MED ORDER — CEFAZOLIN SODIUM-DEXTROSE 2-4 GM/100ML-% IV SOLN
2.0000 g | Freq: Once | INTRAVENOUS | Status: AC
Start: 2021-09-25 — End: 2021-09-25
  Administered 2021-09-25: 2 g via INTRAVENOUS

## 2021-09-25 MED ORDER — ONDANSETRON HCL 4 MG PO TABS: 4.0000 mg | ORAL_TABLET | Freq: Every day | ORAL | 1 refills | Status: AC | PRN

## 2021-09-25 MED ORDER — PHENYLEPHRINE 80 MCG/ML (10ML) SYRINGE FOR IV PUSH (FOR BLOOD PRESSURE SUPPORT)
PREFILLED_SYRINGE | INTRAVENOUS | Status: AC
  Filled 2021-09-25: qty 20

## 2021-09-25 MED ORDER — LACTATED RINGERS IV SOLN
INTRAVENOUS | Status: DC
  Administered 2021-09-25: 1000 mL via INTRAVENOUS

## 2021-09-25 MED ORDER — LIDOCAINE HCL (PF) 2 % IJ SOLN
INTRAMUSCULAR | Status: AC
  Filled 2021-09-25: qty 5

## 2021-09-25 MED ORDER — FENTANYL CITRATE (PF) 100 MCG/2ML IJ SOLN
INTRAMUSCULAR | Status: AC
  Filled 2021-09-25: qty 2

## 2021-09-25 MED ORDER — EPHEDRINE 5 MG/ML INJ
INTRAVENOUS | Status: AC
Start: 2021-09-25 — End: ?
  Filled 2021-09-25: qty 10

## 2021-09-25 MED ORDER — ONDANSETRON HCL 4 MG/2ML IJ SOLN
INTRAMUSCULAR | Status: DC | PRN
  Administered 2021-09-25: 4 mg via INTRAVENOUS

## 2021-09-25 MED ORDER — FENTANYL CITRATE (PF) 100 MCG/2ML IJ SOLN
INTRAMUSCULAR | Status: DC | PRN
  Administered 2021-09-25 (×2): 25 ug via INTRAVENOUS

## 2021-09-25 MED ORDER — DEXAMETHASONE SODIUM PHOSPHATE 4 MG/ML IJ SOLN
INTRAMUSCULAR | Status: DC | PRN
  Administered 2021-09-25: 4 mg via INTRAVENOUS

## 2021-09-25 MED ORDER — OXYCODONE HCL 5 MG PO TABS: 5.0000 mg | ORAL_TABLET | ORAL | 0 refills | Status: DC | PRN

## 2021-09-25 MED ORDER — CEFAZOLIN SODIUM-DEXTROSE 2-4 GM/100ML-% IV SOLN
INTRAVENOUS | Status: AC
  Filled 2021-09-25: qty 100

## 2021-09-25 MED ORDER — PROPOFOL 10 MG/ML IV BOLUS
INTRAVENOUS | Status: DC | PRN
  Administered 2021-09-25: 100 mg via INTRAVENOUS
  Administered 2021-09-25: 50 mg via INTRAVENOUS

## 2021-09-25 MED ORDER — SODIUM CHLORIDE 0.9 % IR SOLN
Status: DC | PRN
  Administered 2021-09-25: 3000 mL

## 2021-09-25 SURGICAL SUPPLY — 26 items
APL SKNCLS STERI-STRIP NONHPOA (GAUZE/BANDAGES/DRESSINGS)
BAG DRAIN URO-CYSTO SKYTR STRL (DRAIN) ×2 IMPLANT
BAG DRN UROCATH (DRAIN) ×1
BASKET STONE 1.7 NGAGE (UROLOGICAL SUPPLIES) ×1 IMPLANT
BASKET ZERO TIP NITINOL 2.4FR (BASKET) ×2 IMPLANT
BENZOIN TINCTURE PRP APPL 2/3 (GAUZE/BANDAGES/DRESSINGS) IMPLANT
BSKT STON RTRVL ZERO TP 2.4FR (BASKET) ×1
CATH URET 5FR 28IN OPEN ENDED (CATHETERS) IMPLANT
CLOTH BEACON ORANGE TIMEOUT ST (SAFETY) ×2 IMPLANT
FIBER LASER FLEXIVA 365 (UROLOGICAL SUPPLIES) IMPLANT
GLOVE BIO SURGEON STRL SZ7.5 (GLOVE) ×2 IMPLANT
GOWN STRL REUS W/TWL XL LVL3 (GOWN DISPOSABLE) ×2 IMPLANT
GUIDEWIRE STR DUAL SENSOR (WIRE) IMPLANT
GUIDEWIRE ZIPWRE .038 STRAIGHT (WIRE) ×2 IMPLANT
IV NS IRRIG 3000ML ARTHROMATIC (IV SOLUTION) ×4 IMPLANT
KIT TURNOVER CYSTO (KITS) ×2 IMPLANT
MANIFOLD NEPTUNE II (INSTRUMENTS) ×2 IMPLANT
NS IRRIG 500ML POUR BTL (IV SOLUTION) ×2 IMPLANT
PACK CYSTO (CUSTOM PROCEDURE TRAY) ×2 IMPLANT
STENT URET 6FRX24 CONTOUR (STENTS) ×1 IMPLANT
STRIP CLOSURE SKIN 1/2X4 (GAUZE/BANDAGES/DRESSINGS) IMPLANT
SYR 10ML LL (SYRINGE) ×2 IMPLANT
TRACTIP FLEXIVA PULS ID 200XHI (Laser) IMPLANT
TRACTIP FLEXIVA PULSE ID 200 (Laser) ×2
TUBE CONNECTING 12X1/4 (SUCTIONS) IMPLANT
TUBING UROLOGY SET (TUBING) ×2 IMPLANT

## 2021-09-25 NOTE — Transfer of Care (Signed)
Immediate Anesthesia Transfer of Care Note ? ?Patient: Laurie Tran ? ?Procedure(s) Performed: CYSTOSCOPY/URETEROSCOPY/ RETROGRADE/HOLMIUM LASER/STENT PLACEMENT (Right: Ureter) ? ?Patient Location: PACU ? ?Anesthesia Type:General ? ?Level of Consciousness: awake, alert , oriented and patient cooperative ? ?Airway & Oxygen Therapy: Patient Spontanous Breathing ? ?Post-op Assessment: Report given to RN and Post -op Vital signs reviewed and stable ? ?Post vital signs: Reviewed and stable ? ?Last Vitals:  ?Vitals Value Taken Time  ?BP 184/82 09/25/21 1311  ?Temp    ?Pulse 65 09/25/21 1316  ?Resp 12 09/25/21 1316  ?SpO2 97 % 09/25/21 1316  ?Vitals shown include unvalidated device data. ? ?Last Pain:  ?Vitals:  ? 09/25/21 1007  ?TempSrc: Oral  ?PainSc: 0-No pain  ?   ? ?Patients Stated Pain Goal: 8 (09/25/21 1007) ? ?Complications: No notable events documented. ?

## 2021-09-25 NOTE — Addendum Note (Signed)
Addendum  created 09/25/21 1419 by Georgeanne Nim, CRNA  ? Intraprocedure Meds edited  ?  ?

## 2021-09-25 NOTE — Anesthesia Procedure Notes (Signed)
Procedure Name: LMA Insertion ?Date/Time: 09/25/2021 12:29 PM ?Performed by: Georgeanne Nim, CRNA ?Pre-anesthesia Checklist: Patient identified, Emergency Drugs available, Suction available, Patient being monitored and Timeout performed ?Patient Re-evaluated:Patient Re-evaluated prior to induction ?Oxygen Delivery Method: Circle system utilized ?Preoxygenation: Pre-oxygenation with 100% oxygen ?Induction Type: IV induction ?Ventilation: Mask ventilation without difficulty ?LMA: LMA inserted ?LMA Size: 4.0 ?Number of attempts: 1 ?Placement Confirmation: positive ETCO2, CO2 detector and breath sounds checked- equal and bilateral ?Tube secured with: Tape ?Dental Injury: Teeth and Oropharynx as per pre-operative assessment  ? ? ? ? ?

## 2021-09-25 NOTE — Op Note (Signed)
Operative Note ? ?Preoperative diagnosis:  ?1.  8 mm right renal stone ?2.  Recurrent UTIs ? ?Postoperative diagnosis: ?1.  8 mm right renal stone ?2.  Recurrent UTIs ? ?Procedure(s): ?1.  Cystoscopy with right ureteroscopy, holmium laser lithotripsy, basket stone extraction and ureteral stent placement ?2.  Right retrograde pyelogram with intraoperative interpretation of fluoroscopic imaging ? ?Surgeon: Ellison Hughs, MD ? ?Assistants:  None ? ?Anesthesia:  General ? ?Complications:  None ? ?EBL: Less than 5 mL ? ?Specimens: ?1.  Right renal stone fragments ? ?Drains/Catheters: ?1.  Right 6 French, 24 cm JJ stent without tether ? ?Intraoperative findings:   ?Nonobstructing right renal stone ?No intravesical or right renal abnormalities were seen endoscopically ?Solitary right collecting system with no filling defects or dilation involving the right ureter or right renal pelvis seen on retrograde pyelogram ? ? ?Indication:  Laurie Tran is a 76 y.o. female with a nonobstructing 8 mm right renal stone along with recurrent UTIs.  She has been consented for the above procedures, voices understanding and wishes to proceed. ? ?Description of procedure: ? ?After informed consent was obtained, the patient was brought to the operating room and general LMA anesthesia was administered. The patient was then placed in the dorsolithotomy position and prepped and draped in the usual sterile fashion. A timeout was performed. A 23 French rigid cystoscope was then inserted into the urethral meatus and advanced into the bladder under direct vision. A complete bladder survey revealed no intravesical pathology. ? ?A 5 French ureteral catheter was then inserted into the right ureteral orifice and a retrograde pyelogram was obtained, with the findings listed above.  A Glidewire was then used to intubate the lumen of the ureteral catheter and was advanced up to the right renal pelvis, under fluoroscopic guidance.  The catheter was  then removed, leaving the wire in place.  An additional sensor wire was then advanced up the right ureter to the right renal pelvis, fluoroscopic guidance. ? ?A 14 French, 35 cm ureteral access sheath was then advanced over the sensor wire and into position within the proximal aspects of the right ureter, confirming placement via fluoroscopy.  A flexible ureteroscope was then advanced through the lumen of the ureteral access sheath and up into the renal pelvis where her nonobstructing lower pole stone was identified.  A 200 ?m holmium laser was then used to puncture the stone into numerous smaller pieces.  An engage basket was then used to extract all sizable stone fragments from the renal pelvis. ? ?The flexible ureteroscope and ureteral access sheath were then removed under direct vision, identifying no evidence of ureteral trauma or stone burden within the lumen of the ureter.  A 6 French, 24 cm JJ stent was then advanced over the wire and into good position within the right collecting system, confirming placement via fluoroscopy. ? ?The patient's bladder was then drained.  She tolerated the procedure well and was transferred to postanesthesia in stable condition. ? ?Plan: Follow-up in 1 week for office cystoscopy and right ureteral stent removal  ? ?

## 2021-09-25 NOTE — H&P (Signed)
PRE-OP H&P ? ?Office Visit Report     09/06/2021  ? ?-------------------------------------------------------------------------------- ?  ?Laurie Tran  ?MRN: [id number]  ?DOB: [birth year], 76 year old Female  ? ? PRIMARY CARE:  Loura Pardon, MD  ?REFERRING:  Harrell Gave A. Lovena Neighbours, MD  ?PROVIDER:  Ellison Hughs, M.D.  ?LOCATION:  [facility] [phone]  ?  ? ?-------------------------------------------------------------------------------- ?  ?CC/HPI: CC: Kidney stones  ? ?HPI: Laurie Tran is a 76 year old female with a history of kidney stones and recurrent UTIs. Hx of urethral dilation 40+ years ago.  ? ?01/23/20: The patient is here today for a routine f/u. Previously followed by Dr. Alyson Ingles and Dr. Matilde Sprang. She is doing well and denies interval episodes of flank pain, stone passage, dysuria or hematuria.  ? ?04/02/20: Patient with above noted hx. She was noted to have an approximately 5 mm left renal calculus on KUB at prior OV. She is s/p left ESWL on 10/7 for treatment. She presents today for follow up. She denies any passage of stone material following her procedure. She currently denies any flank or abdominal pain. No complaints of dysuria or gross hematuria. She currently feels she is voiding at baseline. No complaints of fever, chills, nausea, or vomiting.  ? ?05/30/20: Patient with above history. She returns today for ongoing evaluation. KUB at prior office visit revealed residual stone within the left renal shadow. There was also a questionable opacity noted on the right. Patient elected for continued observation at that time. She denies any interval stone passage. She denies any current flank or abdominal pain. She currently feels she is voiding at baseline. She feels she is voiding normal volumes. No complaints of fever, chills, nausea, or vomiting.  ? ?07/20/2020: Patient with above noted past medical history. She presents today for symptom evaluation and urinalysis. She denies any  interval pain, discomfort or passage of stone material. She feels she is voiding at her baseline. She denies fevers, chills nausea and vomiting. She denies gross hematuria.  ? ?11/15/2020: 76 year old female with the above noted past medical history. She presents today with concerns of uti, flank and lower left abdominal discomfort and increased urgency and frequency. She denies fevers. She is experiencing some slight nausea that comes and goes. She has some known left sided stones, she has not seen a stone pass.  ? ?11/27/2020: She presents today with continued symptoms of UTI including flank pain, lower abdominal discomfort, urgency and frequency. She also has some slight nausea that comes and goes. She denies any fevers or chills.  ? ?12/07/2020: 76 year old female with the above noted past medical history. She has been on 3 antibiotics over the last month. Her CT scan did not show obstructive uropathy. She continues to have bothersome urgency, frequency and dysuria which is associated with pelvic pressure. This came on suddenly. Her symptoms do slightly improved while on antibiotics but never fully resolved. She denies fevers, chills. No complaints of gross hematuria. She does continue to have some lower abdominal discomfort.  ? ?02/07/21: Laurie Tran is here today for a routine follow-up. She recent complete a course of cipro (prescribed by her PCP) for a suspected UTI. Today, she reports intialy improvement in her urinary sxs, but states that she developed mild dysuria and frequency q 2 hours last night. She denies flank pain, fever/chills, hematuria or suprapubic pain today. At baseline, she reports mild SUI and requires adult pads when she has a UTI due exacerbation of her leakage. She also states  that she will occasionally have to strain in order to completely empty her bladder. CT from June revealed a non-obstructing right renal stone with no other GU abnormalities.  ? ?05/09/21: The patient is here today for  a routine follow-up. She has done well over the past 3 months and denies interval UTIs. She continues to require daily Keflex for UTI prophylaxis. She reports a good force of stream is emptying her bladder well with minimal dysuria and no hematuria. She continues to endorse that she has to sometimes Crede in order to initiate her stream.  ? ?09/06/21: The patient is here today for routine follow-up. She is currently on daily cephalexin and states that if she tries to stop the medication, her UTI symptoms return. She denies interval episodes of hematuria, but reports left-sided flank pain. CT from September 2022 revealed an 8 mm right renal stone with no stone burden on the left. She states that she has been adherent with vaginal Estrace along with other UTI preventative measures.  ? ?  ?ALLERGIES: Bextra TABS ?[employer] Ibuprofen CAPS ?Methocarbamol - Swelling, Hives ?NSAIDs - Hives, Bronchitis ?Sulfa ?  ? ?MEDICATIONS: Cephalexin 250 mg tablet 1 tablet PO Daily  ?Cephalexin 500 mg tablet 1 tablet PO Daily  ?Estrace 0.01 % cream with applicator apply a fingertip amount twice per week to the urethra.  ?Azo  ?Co Q10 200 mg capsule  ?Fexofenadine HCl - 180 MG Oral Tablet Oral  ?Glucosamine  ?Hydrochlorothiazide 25 mg tablet 0 Oral  ?Lipitor 10 MG Oral Tablet Oral  ?MetFORMIN HCl - 500 MG Oral Tablet Oral  ?Multiple Vitamin  ?Nasonex SUSP Nasal  ?Potassium  ?Preservision Areds  ?Tramadol Hcl 50 mg tablet 1 tablet PO Q 6 H PRN  ?  ? ?GU PSH: Catheterize For Residual - 2020 ?Cysto Remove Stent FB Sim - 2020 ?Cystoscopy Insert Stent, Right - 2020, Left - 2018 ?Cystoscopy Ureteroscopy, Left - 2018 ?ESWL, Left - 03/01/2020, Right - 2021, 2012, 2012 ?Ureteroscopic laser litho, Right - 2020 ? ?  ?   ?PSH Notes: Lithotripsy, Cystourethroscopy, Lithotripsy, Breast Surgery  ? ?NON-GU PSH: Breast Surgery Procedure - 2012 ? ?  ? ?GU PMH: Chronic cystitis (w/o hematuria) - 05/09/2021, - 02/07/2021 ?Stress Incontinence - 02/07/2021 ?Acute  Cystitis/UTI - 12/07/2020, - 11/27/2020, - 11/15/2020 ?Suprapubic pain - 12/07/2020 ?Renal calculus - 11/27/2020, - 11/15/2020, - 07/20/2020, - 05/30/2020, - 04/02/2020, - 09/13/2019, - 2021, - 2020, - 2020, - 2020, - 2017 ?Renal and ureteral calculus - 05/30/2020, - 09/27/2019 ?History of urolithiasis (Stable) - 2018, Nephrolithiasis, - 2014 ?Flank Pain - 2018, - 2018 ?Ureteral calculus (Worsening), Left - 2018 ?Urinary Frequency - 2017 ?Benign neoplasm of unspecified ovary, Dermoid cyst of ovary - 2014 ?  ?   ?PMH Notes:  ?1898-05-26 00:00:00 - Note: Normal Routine History And Physical Adult  ?2011-07-22 08:24:09 - Note: Nephrolithiasis Of The Right Kidney  ?2010-11-21 15:52:19 - Note: Arthritis  ? ?NON-GU PMH: Bacteriuria - 2020 ?Cardiac murmur, unspecified, Murmurs - 2014 ?Personal history of other diseases of the circulatory system, History of hypertension - 2014 ?Personal history of other endocrine, nutritional and metabolic disease, History of hypercholesterolemia - 2014, History of diabetes mellitus, - 2014 ?Personal history of other specified conditions, History of heartburn - 2014 ?Heartburn ?  ? ?FAMILY HISTORY: 1 Daughter - Other ?1 son - Other ?Acute Lymphoma - Mother ?Breast Cancer - Mother, Sister ?Colon Cancer - Brother ?father deceased at age 49 - Other ?patient's mother is still living - Other  ?  Notes: Mother is 55  ?Father died in a traffic accident  ? ?SOCIAL HISTORY: Marital Status: Married ?Preferred Language: [place]; Ethnicity: Not Hispanic Or Latino; Race: White ?Current Smoking Status: Patient has never smoked.  ?Has never drank.  ?Does not drink caffeine. ?Patient's occupation TEFL teacher. ?  ? ?REVIEW OF SYSTEMS:    ?GU Review Female:   Patient denies frequent urination, hard to postpone urination, burning /pain with urination, get up at night to urinate, leakage of urine, stream starts and stops, trouble starting your stream, have to strain to urinate, and being pregnant.  ?Gastrointestinal  (Upper):   Patient denies nausea, vomiting, and indigestion/ heartburn.  ?Gastrointestinal (Lower):   Patient denies diarrhea and constipation.  ?Constitutional:   Patient denies fever, night sweats, weight los

## 2021-09-25 NOTE — Anesthesia Preprocedure Evaluation (Signed)
Anesthesia Evaluation  ?Patient identified by MRN, date of birth, ID band ?Patient awake ? ? ? ?Reviewed: ?Allergy & Precautions, NPO status , Patient's Chart, lab work & pertinent test results ? ?Airway ?Mallampati: II ? ?TM Distance: >3 FB ?Neck ROM: Full ? ? ? Dental ?no notable dental hx. ? ?  ?Pulmonary ?neg pulmonary ROS,  ?  ?Pulmonary exam normal ?breath sounds clear to auscultation ? ? ? ? ? ? Cardiovascular ?hypertension, Pt. on medications ?Normal cardiovascular exam ?Rhythm:Regular Rate:Normal ? ? ?  ?Neuro/Psych ?negative neurological ROS ? negative psych ROS  ? GI/Hepatic ?Neg liver ROS, GERD  Medicated,  ?Endo/Other  ?diabetes, Type 2 ? Renal/GU ?negative Renal ROS  ?negative genitourinary ?  ?Musculoskeletal ?negative musculoskeletal ROS ?(+)  ? Abdominal ?  ?Peds ?negative pediatric ROS ?(+)  Hematology ?negative hematology ROS ?(+)   ?Anesthesia Other Findings ? ? Reproductive/Obstetrics ?negative OB ROS ? ?  ? ? ? ? ? ? ? ? ? ? ? ? ? ?  ?  ? ? ? ? ? ? ? ? ?Anesthesia Physical ?Anesthesia Plan ? ?ASA: 2 ? ?Anesthesia Plan: General  ? ?Post-op Pain Management: Minimal or no pain anticipated  ? ?Induction: Intravenous ? ?PONV Risk Score and Plan: 3 and Ondansetron, Dexamethasone and Treatment may vary due to age or medical condition ? ?Airway Management Planned: LMA ? ?Additional Equipment:  ? ?Intra-op Plan:  ? ?Post-operative Plan: Extubation in OR ? ?Informed Consent: I have reviewed the patients History and Physical, chart, labs and discussed the procedure including the risks, benefits and alternatives for the proposed anesthesia with the patient or authorized representative who has indicated his/her understanding and acceptance.  ? ? ? ?Dental advisory given ? ?Plan Discussed with: CRNA and Surgeon ? ?Anesthesia Plan Comments:   ? ? ? ? ? ? ?Anesthesia Quick Evaluation ? ?

## 2021-09-25 NOTE — Anesthesia Postprocedure Evaluation (Signed)
Anesthesia Post Note ? ?Patient: Laurie Tran ? ?Procedure(s) Performed: CYSTOSCOPY/URETEROSCOPY/ RETROGRADE/HOLMIUM LASER/STENT PLACEMENT (Right: Ureter) ? ?  ? ?Patient location during evaluation: PACU ?Anesthesia Type: General ?Level of consciousness: awake and alert ?Pain management: pain level controlled ?Vital Signs Assessment: post-procedure vital signs reviewed and stable ?Respiratory status: spontaneous breathing, nonlabored ventilation, respiratory function stable and patient connected to nasal cannula oxygen ?Cardiovascular status: blood pressure returned to baseline and stable ?Postop Assessment: no apparent nausea or vomiting ?Anesthetic complications: no ? ? ?No notable events documented. ? ?Last Vitals:  ?Vitals:  ? 09/25/21 1330 09/25/21 1345  ?BP: (!) 170/103 (!) 175/70  ?Pulse: 63 60  ?Resp: 13 (!) 9  ?Temp:  36.6 ?C  ?SpO2: 98% 97%  ?  ?Last Pain:  ?Vitals:  ? 09/25/21 1345  ?TempSrc:   ?PainSc: 0-No pain  ? ? ?  ?  ?  ?  ?  ?  ? ?Loraina Stauffer S ? ? ? ? ?

## 2021-09-25 NOTE — Discharge Instructions (Signed)

## 2021-09-26 ENCOUNTER — Encounter (HOSPITAL_BASED_OUTPATIENT_CLINIC_OR_DEPARTMENT_OTHER): Payer: Self-pay | Admitting: Urology

## 2021-10-08 ENCOUNTER — Other Ambulatory Visit: Payer: Self-pay | Admitting: Family Medicine

## 2021-10-19 ENCOUNTER — Other Ambulatory Visit: Payer: Self-pay | Admitting: Family Medicine

## 2022-03-11 ENCOUNTER — Encounter: Payer: Self-pay | Admitting: Registered Nurse

## 2022-03-11 ENCOUNTER — Telehealth: Payer: Self-pay | Admitting: Registered Nurse

## 2022-03-11 DIAGNOSIS — M25561 Pain in right knee: Secondary | ICD-10-CM

## 2022-03-11 NOTE — Telephone Encounter (Signed)
Patient came to clinic for advice regarding right knee pain.  "Feels weak at times and giving out" "sharp pain side of knee"  denied known injury/falls.  History of meniscal tear/surgery.  Neoprene sleeve fitted and distributed from clinic stock small diameter 14.5" right knee over central patella/fossa today; no erythema/ecchymosis/edema/effusion.  Gait slow and steady in clinic.  Patient stated only gives her problems when walking fast so taking it easy today.  Established with [facility].  She will schedule appt for re-evaluation as if work restrictions needed I am unable to give work notes in this clinic due to contract limitations.  Discussed symptoms of ligament strain versus meniscal injury.  Patient denied recent twisting motions/new exercise program.  Exitcare handouts on knee pain acute, osteoarthritis, lateral collateral knee ligament sprain and meniscal tear sent to patient my chart.    Patient agreed with plan of care and had no further questions at that time.  On epic review: 05/11/2018 05/11/2018 IMPRESSION:  1. 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   2. Left knee moderate varus osteoarthritis.     DISPOSITION:   Diagnoses, x-rays, and treatment discussed with the patient.     She was given knee exercises. She is unable to take anti inflammatories. She is not interested in any type of injections today. She was instructed on low impact exercise. Ms. Swenor may return as needed.     All questions were answered and patient is happy with the plan.     Erin Sons, am acting as scribe for Kurtis Bushman, M.D.   11/04/2019 11/04/2019 IMPRESSION:  Right knee osteoarthritis flare.   DISPOSITION: Diagnoses, x-rays, and treatment options discussed with the patient in detail. She declined a right knee cortisone injection today. I have prescribed a Medrol Dosepak. She was also fit for a right knee brace. She was provided with right knee exercises. I expect her  to return back to her baseline. Ultimately, she is a candidate for right total knee arthroplasty.    Patient seen in workcenter 2 hours after knee sleeve applied.  She stated it was helping to make her more comfortable.

## 2022-03-13 ENCOUNTER — Ambulatory Visit: Payer: No Typology Code available for payment source | Admitting: Registered Nurse

## 2022-03-13 ENCOUNTER — Encounter: Payer: Self-pay | Admitting: Registered Nurse

## 2022-03-13 VITALS — BP 169/88 | HR 71 | Temp 97.8°F | Resp 16

## 2022-03-13 DIAGNOSIS — I10 Essential (primary) hypertension: Secondary | ICD-10-CM

## 2022-03-13 DIAGNOSIS — S86911A Strain of unspecified muscle(s) and tendon(s) at lower leg level, right leg, initial encounter: Secondary | ICD-10-CM

## 2022-03-13 NOTE — Patient Instructions (Addendum)
Lateral Collateral Knee Ligament Sprain  The lateral collateral ligament (LCL) is a tough band of tissue that connects the thigh bone to the smaller of the lower leg bones. It is located on the outer side of the knee and helps keep the knee stable. An LCL sprain is a stretch or tear in the LCL. What are the causes? This condition may be caused by: A hard, direct hit to the inner side of your knee. Running and changing directions quickly (cutting). Twisting your knee forcefully. What increases the risk? The following factors may make you more likely to develop this condition: Playing contact sports that involve cutting, such as football or soccer. Participating in sports in which there is a risk of twisting the knee, like skiing or wrestling. Having weak muscles. Having poor coordination. What are the signs or symptoms? Symptoms of this condition include: Pain on the outside of the knee. A popping sound at the time of injury. Swelling along the outside of the knee. Bruising around the knee. Feeling unstable when you stand, like your knee will give way. Difficulty walking on uneven surfaces. Numbness at the knee. How is this diagnosed? This condition may be diagnosed based on: Your medical history. A physical exam. Your health care provider will feel the side of your knee and move it to check for stability. Imaging tests, such as an X-ray or MRI. How is this treated? This condition may be treated by: Resting the knee by keeping weight off of it until swelling and pain improve. Raising (elevating) the knee above the level of your heart. This helps to reduce swelling. Icing the knee. This helps to reduce swelling. Taking an NSAID, like ibuprofen. This helps to reduce pain and swelling. Using a knee brace and crutches while the injury heals. Using a knee brace when participating in athletic activities. Doing rehab exercises (physical therapy). Surgery. This may be needed if: Your LCL  tore all the way through. Your knee is unstable. Your knee is not getting better with other treatments. Follow these instructions at home: If you have a removable brace: Wear the brace as told by your health care provider. Remove it only as told by your health care provider. Check the skin around the brace every day. Tell your health care provider about any concerns. Loosen the brace if your toes tingle, become numb, or turn cold and blue. Keep the brace clean. If the brace is not waterproof: Do not let it get wet. Cover it with a watertight covering when you take a bath or shower. Managing pain, stiffness, and swelling  If directed, put ice on the outer side of your knee. To do this: If you have a removable brace, remove it as told by your health care provider. Put ice in a plastic bag. Place a towel between your skin and the bag. Leave the ice on for 20 minutes, 2-3 times a day. Remove the ice if your skin turns bright red. This is very important. If you cannot feel pain, heat, or cold, you have a greater risk of damage to the area. Move your foot and toes often to reduce stiffness and swelling. Raise (elevate) your knee above the level of your heart while you are sitting or lying down. Medicines Take over-the-counter and prescription medicines only as told by your health care provider. Ask your health care provider if the medicine prescribed to you: Requires you to avoid driving or using machinery. Can cause constipation. You may need to take these  actions to prevent or treat constipation: Drink enough fluid to keep your urine pale yellow. Take over-the-counter or prescription medicines. Eat foods that are high in fiber, such as beans, whole grains, and fresh fruits and vegetables. Limit foods that are high in fat and processed sugars, such as fried or sweet foods. Activity Ask your health care provider when it is safe to drive if you have a brace on your leg. Do exercises as told  by your health care provider. Do not use the injured leg to support your body weight until your health care provider says that you can. Use crutches and a brace as told by your health care provider. Return to your normal activities as told by your health care provider. Ask your health care provider what activities are safe for you. General instructions Do not use any products that contain nicotine or tobacco. These products include cigarettes, chewing tobacco, and vaping devices, such as e-cigarettes. If you need help quitting, ask your health care provider. Keep all follow-up visits. This is important. How is this prevented? Warm up and stretch before being active. Cool down and stretch after being active. Give your body time to rest between periods of activity. Make sure to use equipment that fits you. Be safe and responsible while being active. This will help you avoid falls. Do at least 150 minutes of moderate-intensity exercise each week, such as brisk walking or water aerobics. Maintain physical fitness, including: Strength. Flexibility. Cardiovascular fitness. Endurance. Contact a health care provider if: You continue to have pain and swelling for 2-4 weeks. Your symptoms get worse. Your knee feels unstable or gives way. Summary The lateral collateral ligament (LCL) is a tough band of tissue that connects the thigh bone to the smaller of the lower leg bones. An LCL sprain is a stretch or tear in the LCL. This condition may be treated in several ways, including resting your knee, wearing a brace, taking medicines, and having surgery. Do not use the injured leg to support your body weight until your health care provider says that you can. Use crutches and a brace as told by your health care provider. Contact a health care provider if pain and swelling do not improve in 2-4 weeks or your symptoms get worse. This information is not intended to replace advice given to you by your health  care provider. Make sure you discuss any questions you have with your health care provider. Document Revised: 01/14/2021 Document Reviewed: 01/14/2021 Elsevier Patient Education  [place]. Lateral Collateral Knee Ligament Sprain, Phase I Rehab Ask your health care provider which exercises are safe for you. Do exercises exactly as told by your health care provider and adjust them as directed. It is normal to feel mild stretching, pulling, tightness, or discomfort as you do these exercises. Stop right away if you feel sudden pain or your pain gets worse. Do not begin these exercises until told by your health care provider. Stretching and range-of-motion exercise This exercise warms up your muscles and joints and improves the movement and flexibility of your knee. This exercise also helps to relieve pain. Heel slide  Lie on your back with both knees straight. If this causes back discomfort, bend your healthy knee so your foot is flat on the floor. Slowly slide your left / right heel back toward your buttocks. Stop when you feel a gentle stretch in the front of your knee or thigh. Do not try to force the knee back or push through  pain on the outside of your injured knee. Hold this position for _____5-15_____ seconds. Slowly slide your left / right heel back to the starting position. Repeat ___3_______ times. Complete this exercise ______2____ times a day. Strengthening exercises These exercises build strength and endurance in your knee. Endurance is the ability to use your muscles for a long time, even after they get tired. Straight leg raises This exercise strengthens the muscles in the front of your thigh (quadriceps). Lie on your back with your left / right leg extended and your healthy knee bent. Tense the muscles in the front of your left / right thigh. You should see your kneecap slide up or see increased dimpling just above the knee. Your thigh may even shake a bit. Keep these  muscles tight as you raise your leg 4-6 inches (10-15 cm) off the floor. Do not let your knee bend. If you cannot do this exercise without bending your knee, tighten the muscles in the front of your left / right thigh (quadriceps set), but do not lift your leg. Hold this position for ___5-15_______ seconds. Keep the thigh muscles tense as you lower your leg. Relax the muscles slowly and completely after each repetition. Repeat _____3_____ times. Complete this exercise ___2_______ times a day. Hamstring curls  This exercise strengthens the muscles in the back of your thigh and knee (hamstrings). If told by your health care provider, do this exercise while wearing ankle weights. Begin with _none_________ lb / kg weights. Then increase the weight by 1 lb (0.5 kg) increments. Do not wear ankle weights that are more than ____none______ lb / kg. Lie on your abdomen with your legs straight. This is the prone position. Bend your left / right knee as far as you can without feeling pain. Keep your hips flat against the surface that you are lying on. When you bend your knee, bring your foot straight toward your buttock. Do not let your foot or leg fall in or out. Hold this position for ___5-15_______ seconds. Slowly lower your leg to the starting position. Repeat ____3______ times. Complete this exercise _____2_____ times a day. Bridge  This exercise strengthens the muscles in your buttocks and the muscles in the back of your knee (hip extensors). If this exercise is too easy, try doing it with your arms crossed over your chest, or try lifting one leg while your buttocks are up off the floor. Lie on your back on a firm surface with your knees bent and your feet flat on the floor. Tighten your buttocks muscles and lift your buttocks off the floor until your trunk is level with your thighs. Do not arch your back. You should feel the muscles working in your buttocks and the back of your thighs. If you do not  feel a stretch in these muscles, slide your feet 1-2 inches (2.5-5 cm) farther away from your buttocks. Hold this position for __5-15________ seconds. Slowly lower your hips to the starting position. Let your buttocks muscles relax completely between repetitions. Repeat ____3______ times. Complete this exercise ___2_______ times a day. Heel raise  Stand with your feet shoulder-width apart. Keep your weight spread evenly over the width of your feet while you rise up on your toes. Use a wall or table to steady yourself, but try not to use it much for support. If this exercise is too easy, shift your weight toward your left / right leg until you feel challenged. Hold this position for ____5-15______ seconds. Slowly lower yourself to  the starting position. Repeat ____3______ times. Complete this exercise ____2______ times a day. This information is not intended to replace advice given to you by your health care provider. Make sure you discuss any questions you have with your health care provider. Document Revised: 01/14/2021 Document Reviewed: 01/14/2021 Elsevier Patient Education  [place].

## 2022-03-13 NOTE — Progress Notes (Signed)
Subjective:    Patient ID: Laurie Tran, female    DOB: Apr 23, 1946, 76 y.o.   MRN: [id number]  76y/o married caucasian female established patient here for evaluation right knee pain.  If walking fast feels like going to give out and sometimes does.  Denied new exercise programs or changes at work requirements/dogs let out in fenced back yard not walking on leash.  History of meniscal tear right knee and arthritis bilateral knees established with orthopedics.  Has had course of physical therapy for meniscal tear and refused total knee replacement at this time awaiting further advances in total knee components as allergic to some metals and read that some parts have been failing sooner than expected.  Denied swelling/bruising/rash/trauma.  Neoprene knee sleeve helping getting occasional sharp lateral pain with activity but no longer feeling knee is going to give out.  Icing after work and resting.  Avoiding fast paced walking as that seems to worsen symptoms.      Review of Systems  Constitutional:  Negative for chills and fever.  HENT:  Negative for trouble swallowing and voice change.   Respiratory:  Negative for cough.   Gastrointestinal:  Negative for diarrhea and vomiting.  Musculoskeletal:  Positive for arthralgias and gait problem. Negative for joint swelling.  Skin:  Negative for color change, pallor, rash and wound.  Allergic/Immunologic: Positive for environmental allergies.  Neurological:  Positive for weakness. Negative for tremors, seizures, syncope, speech difficulty and numbness.  Hematological:  Negative for adenopathy.  Psychiatric/Behavioral:  Negative for sleep disturbance.        Objective:   Physical Exam Vitals and nursing note reviewed.  Constitutional:      General: She is awake. She is not in acute distress.    Appearance: Normal appearance. She is well-developed, well-groomed and normal weight. She is not ill-appearing, toxic-appearing or diaphoretic.  HENT:      Head: Normocephalic and atraumatic.     Jaw: There is normal jaw occlusion.     Salivary Glands: Right salivary gland is not diffusely enlarged. Left salivary gland is not diffusely enlarged.     Right Ear: Hearing and external ear normal.     Left Ear: Hearing and external ear normal.     Nose: Nose normal. No congestion or rhinorrhea.     Mouth/Throat:     Lips: Pink. No lesions.     Mouth: Mucous membranes are moist. No oral lesions or angioedema.     Dentition: No gum lesions.     Tongue: No lesions.     Pharynx: Oropharynx is clear. Uvula midline.  Eyes:     General: Lids are normal. Vision grossly intact. Gaze aligned appropriately. No allergic shiner or scleral icterus.       Right eye: No discharge.        Left eye: No discharge.     Extraocular Movements: Extraocular movements intact.     Conjunctiva/sclera: Conjunctivae normal.     Pupils: Pupils are equal, round, and reactive to light.  Neck:     Trachea: Trachea and phonation normal. No tracheal deviation.  Cardiovascular:     Rate and Rhythm: Normal rate and regular rhythm.     Pulses: Normal pulses.          Radial pulses are 2+ on the right side and 2+ on the left side.  Pulmonary:     Effort: Pulmonary effort is normal. No respiratory distress.     Breath sounds: Normal breath sounds and air  entry. No stridor or transmitted upper airway sounds. No wheezing.     Comments: Spoke full sentences without difficulty; no cough observed in exam room Abdominal:     General: Abdomen is flat.     Palpations: Abdomen is soft.  Musculoskeletal:        General: Tenderness present. No swelling or deformity.     Right hand: Normal strength. Normal capillary refill.     Left hand: Normal strength. Normal capillary refill.     Cervical back: Normal range of motion and neck supple. No edema, erythema, signs of trauma, rigidity or torticollis.     Right knee: No swelling, deformity, effusion, erythema, ecchymosis, lacerations,  bony tenderness or crepitus. Decreased range of motion. Tenderness present over the LCL. No medial joint line, lateral joint line, MCL, ACL, PCL or patellar tendon tenderness. LCL laxity and MCL laxity present. No ACL laxity or PCL laxity. Normal patellar mobility.     Instability Tests: Anterior drawer test negative. Posterior drawer test negative. Anterior Lachman test negative. Medial McMurray test negative and lateral McMurray test negative.     Left knee: No swelling, deformity, effusion, erythema, ecchymosis, lacerations, bony tenderness or crepitus. Decreased range of motion. No tenderness. No medial joint line, lateral joint line, MCL, LCL, ACL, PCL or patellar tendon tenderness. No LCL laxity, MCL laxity, ACL laxity or PCL laxity.Normal patellar mobility.     Instability Tests: Anterior drawer test negative. Posterior drawer test negative. Anterior Lachman test negative. Medial McMurray test negative and lateral McMurray test negative.     Right lower leg: No swelling, deformity, lacerations, tenderness or bony tenderness. No edema.     Left lower leg: No swelling, deformity, lacerations, tenderness or bony tenderness. No edema.     Right ankle: No swelling or ecchymosis.     Left ankle: No swelling or ecchymosis.     Right foot: No swelling.     Left foot: No swelling.     Comments: Patient left knee flexion foot 12 inches from buttock lying on back; right 14 inches max flexion without patient using hands to move foot; compared to left; right knee with mild laxity LCL/MCL  Lymphadenopathy:     Head:     Right side of head: No submandibular or preauricular adenopathy.     Left side of head: No submandibular or preauricular adenopathy.     Cervical: No cervical adenopathy.     Right cervical: No superficial cervical adenopathy.    Left cervical: No superficial cervical adenopathy.  Skin:    General: Skin is warm and dry.     Capillary Refill: Capillary refill takes less than 2 seconds.      Coloration: Skin is not ashen, cyanotic, jaundiced, mottled, pale or sallow.     Findings: No abrasion, abscess, acne, bruising, burn, ecchymosis, erythema, signs of injury, laceration, lesion, petechiae, rash or wound.     Nails: There is no clubbing.     Comments: On exposed skin face/arms/legs examined  Neurological:     General: No focal deficit present.     Mental Status: She is alert and oriented to person, place, and time. Mental status is at baseline.     GCS: GCS eye subscore is 4. GCS verbal subscore is 5. GCS motor subscore is 6.     Cranial Nerves: Cranial nerves 2-12 are intact. No cranial nerve deficit, dysarthria or facial asymmetry.     Sensory: Sensation is intact.     Motor: Motor function is intact.  No weakness, tremor, atrophy, abnormal muscle tone or seizure activity.     Coordination: Coordination is intact. Coordination normal.     Gait: Gait is intact. Gait normal.     Comments: In/out of chair and on/off exam table without difficulty; gait sure and steady in clinic; bilateral hand grasp equal 5/5; no limp observed in clinic or warehouse  Psychiatric:        Attention and Perception: Attention and perception normal.        Mood and Affect: Mood and affect normal.        Speech: Speech normal.        Behavior: Behavior normal. Behavior is cooperative.        Thought Content: Thought content normal.        Cognition and Memory: Cognition and memory normal.        Judgment: Judgment normal.      Some laxity right compared to left valgus/varus; TTP lateral right knee soft tissue not joint line; chronic patellar pain "this is different"  "doesn't feel like meniscal tear either"  Wearing small neoprene knee sleeve helping doesn't tolerate nsaids resting and icing recumbant bike for exercise  1700 patient seen in parking lot leaving employer slow gait stated knee did not give out once today which was very good for her and knee sleeve helping a great deal along with  activity modification of no speed walking.  Plans to ice after work and elevate prn if swelling/pain as does not tolerate NSAIDs.     Assessment & Plan:  A-strain of right knee initial encounter and elevated blood pressure  P-pain most likely cause of elevated blood pressure will continue to monitor and pain management tips discussed with patient.  MCL and LCL laxity noted right compared to left knee on exam.  Knee sleeve helping past 48 hours.  Consider orthopedics follow up if new or worsening symptoms otherwise ice/rest/knee sleeve x 2 weeks if all symptoms not resolved continue another 2 weeks use if improving but not resolved.  Discussed consider tai chi in addition to recumbant bike to help with balance/core muscle strength as she ages and is gentle on joints.  Pilates and yoga can also be beneficial if in the right class as many different disciplines/methodologies for these courses and difficulty/strenuousness varies along with length of class/amount of time holding each pose.  Consider seated/hatha yoga geared toward older clientele versus hot yoga/classes targeting college students.  Patient verbalized understanding information/instructions, agreed with plan of care and had no further questions at this time.

## 2022-04-01 LAB — HM DIABETES EYE EXAM

## 2022-04-21 ENCOUNTER — Ambulatory Visit: Payer: No Typology Code available for payment source | Admitting: Occupational Medicine

## 2022-04-21 DIAGNOSIS — M545 Low back pain, unspecified: Secondary | ICD-10-CM

## 2022-04-21 NOTE — Progress Notes (Signed)
Pt requesting Heat pack for lower back pain. Pt given one and helped applied. Pt reports pain ok when walking worse when sitting and getting up from sitting

## 2022-05-23 LAB — HM MAMMOGRAPHY

## 2022-05-23 LAB — HM DEXA SCAN

## 2022-06-08 ENCOUNTER — Encounter: Payer: Self-pay | Admitting: Family Medicine

## 2022-06-08 DIAGNOSIS — M8580 Other specified disorders of bone density and structure, unspecified site: Secondary | ICD-10-CM | POA: Insufficient documentation

## 2022-06-12 ENCOUNTER — Encounter: Payer: Self-pay | Admitting: Family Medicine

## 2022-06-26 ENCOUNTER — Encounter: Payer: Self-pay | Admitting: Family Medicine

## 2022-06-29 ENCOUNTER — Other Ambulatory Visit: Payer: Self-pay | Admitting: Family Medicine

## 2022-06-30 ENCOUNTER — Telehealth: Payer: Self-pay | Admitting: Registered Nurse

## 2022-06-30 ENCOUNTER — Encounter: Payer: Self-pay | Admitting: Registered Nurse

## 2022-06-30 DIAGNOSIS — R748 Abnormal levels of other serum enzymes: Secondary | ICD-10-CM

## 2022-06-30 DIAGNOSIS — Z0000 Encounter for general adult medical examination without abnormal findings: Secondary | ICD-10-CM

## 2022-06-30 NOTE — Telephone Encounter (Signed)
See prev comment. Usually PCP places orders on Rx pad and I fax to employer  Meds refilled once

## 2022-06-30 NOTE — Telephone Encounter (Signed)
Patient due [facility] Well labs and annual labs with PCM.  On metformin for diabetes will check B12.  Historical vitamin D normal.  Taking magnesium will check level.  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  Patient to schedule with RN Evlyn Kanner last labs Feb 2023 per Epic review.

## 2022-06-30 NOTE — Telephone Encounter (Signed)
Pt is due for a CPE on or after 07/19/22, please schedule appt and then route back to me to refill, thanks

## 2022-06-30 NOTE — Telephone Encounter (Signed)
Spoke to pt, scheduled cpe for 07/24/22. Pt requested lab orders to be sent to [facility]raction)? Call back # [phone]

## 2022-07-07 NOTE — Telephone Encounter (Signed)
Done and in IN box 

## 2022-07-08 NOTE — Telephone Encounter (Signed)
Called pt and got fax # to job, orders faxed and pt aware

## 2022-07-09 ENCOUNTER — Telehealth: Payer: Self-pay | Admitting: Family Medicine

## 2022-07-09 NOTE — Telephone Encounter (Signed)
Kimrey called from [facility]  [phone] ext. [phone]  Patient has an order for urine microalbumin, not sure of specimen type, she tried to search [facility] orders but wasn't sure of the correct one  Requesting a return call to confirm

## 2022-07-10 ENCOUNTER — Other Ambulatory Visit: Payer: Self-pay | Admitting: Occupational Medicine

## 2022-07-10 DIAGNOSIS — E118 Type 2 diabetes mellitus with unspecified complications: Secondary | ICD-10-CM

## 2022-07-10 DIAGNOSIS — N200 Calculus of kidney: Secondary | ICD-10-CM

## 2022-07-10 DIAGNOSIS — Z87440 Personal history of urinary (tract) infections: Secondary | ICD-10-CM

## 2022-07-10 DIAGNOSIS — I10 Essential (primary) hypertension: Secondary | ICD-10-CM

## 2022-07-10 NOTE — Telephone Encounter (Signed)
Since she is using [facility] they call it Albumin/ Creatinine ratio, called and gave Evlyn Kanner this info

## 2022-07-14 ENCOUNTER — Other Ambulatory Visit: Payer: No Typology Code available for payment source | Admitting: Occupational Medicine

## 2022-07-14 DIAGNOSIS — Z0000 Encounter for general adult medical examination without abnormal findings: Secondary | ICD-10-CM

## 2022-07-14 DIAGNOSIS — N200 Calculus of kidney: Secondary | ICD-10-CM

## 2022-07-14 DIAGNOSIS — I10 Essential (primary) hypertension: Secondary | ICD-10-CM

## 2022-07-14 DIAGNOSIS — E118 Type 2 diabetes mellitus with unspecified complications: Secondary | ICD-10-CM

## 2022-07-14 DIAGNOSIS — Z87440 Personal history of urinary (tract) infections: Secondary | ICD-10-CM

## 2022-07-14 NOTE — Progress Notes (Signed)
Lab drawn from Left AC tolerated well no issues noted.

## 2022-07-15 ENCOUNTER — Ambulatory Visit: Payer: No Typology Code available for payment source | Admitting: Occupational Medicine

## 2022-07-15 DIAGNOSIS — Z0000 Encounter for general adult medical examination without abnormal findings: Secondary | ICD-10-CM

## 2022-07-15 DIAGNOSIS — E118 Type 2 diabetes mellitus with unspecified complications: Secondary | ICD-10-CM

## 2022-07-15 DIAGNOSIS — R748 Abnormal levels of other serum enzymes: Secondary | ICD-10-CM

## 2022-07-15 LAB — CMP12+LP+TP+TSH+6AC+CBC/D/PLT
ALT: 12 IU/L (ref 0–32)
AST: 17 IU/L (ref 0–40)
Albumin/Globulin Ratio: 1.5 (ref 1.2–2.2)
Albumin: 4.2 g/dL (ref 3.8–4.8)
Alkaline Phosphatase: 115 IU/L (ref 44–121)
BUN/Creatinine Ratio: 19 (ref 12–28)
BUN: 19 mg/dL (ref 8–27)
Basophils Absolute: 0.1 10*3/uL (ref 0.0–0.2)
Basos: 1 %
Bilirubin Total: 0.6 mg/dL (ref 0.0–1.2)
Calcium: 10 mg/dL (ref 8.7–10.3)
Chloride: 101 mmol/L (ref 96–106)
Chol/HDL Ratio: 2.8 ratio (ref 0.0–4.4)
Cholesterol, Total: 161 mg/dL (ref 100–199)
Creatinine, Ser: 1 mg/dL (ref 0.57–1.00)
EOS (ABSOLUTE): 0.3 10*3/uL (ref 0.0–0.4)
Eos: 4 %
Estimated CHD Risk: <0.5 times avg. (ref 0.0–1.0)
Free Thyroxine Index: 2.4 (ref 1.2–4.9)
GGT: 7 IU/L (ref 0–60)
Globulin, Total: 2.8 g/dL (ref 1.5–4.5)
Glucose: 73 mg/dL (ref 70–99)
HDL: 57 mg/dL (ref >39)
Hematocrit: 41 % (ref 34.0–46.6)
Hemoglobin: 13.2 g/dL (ref 11.1–15.9)
Immature Grans (Abs): 0 10*3/uL (ref 0.0–0.1)
Immature Granulocytes: 0 %
Iron: 101 ug/dL (ref 27–139)
LDH: 228 IU/L — ABNORMAL HIGH (ref 119–226)
LDL Chol Calc (NIH): 87 mg/dL (ref 0–99)
Lymphocytes Absolute: 2 10*3/uL (ref 0.7–3.1)
Lymphs: 23 %
MCH: 28 pg (ref 26.6–33.0)
MCHC: 32.2 g/dL (ref 31.5–35.7)
MCV: 87 fL (ref 79–97)
Monocytes Absolute: 0.4 10*3/uL (ref 0.1–0.9)
Monocytes: 5 %
Neutrophils Absolute: 5.8 10*3/uL (ref 1.4–7.0)
Neutrophils: 67 %
Phosphorus: 3.5 mg/dL (ref 3.0–4.3)
Platelets: 322 10*3/uL (ref 150–450)
Potassium: 4.2 mmol/L (ref 3.5–5.2)
RBC: 4.71 x10E6/uL (ref 3.77–5.28)
RDW: 12.8 % (ref 11.7–15.4)
Sodium: 142 mmol/L (ref 134–144)
T3 Uptake Ratio: 26 % (ref 24–39)
T4, Total: 9.3 ug/dL (ref 4.5–12.0)
TSH: 3.01 u[IU]/mL (ref 0.450–4.500)
Total Protein: 7 g/dL (ref 6.0–8.5)
Triglycerides: 95 mg/dL (ref 0–149)
Uric Acid: 5.3 mg/dL (ref 3.1–7.9)
VLDL Cholesterol Cal: 17 mg/dL (ref 5–40)
WBC: 8.6 10*3/uL (ref 3.4–10.8)
eGFR: 59 mL/min/{1.73_m2} — ABNORMAL LOW (ref >59)

## 2022-07-15 LAB — MICROALBUMIN / CREATININE URINE RATIO
Creatinine, Urine: 57.4 mg/dL
Microalb/Creat Ratio: <5 mg/g creat (ref 0–29)
Microalbumin, Urine: <3 ug/mL

## 2022-07-15 LAB — HEMOGLOBIN A1C
Est. average glucose Bld gHb Est-mCnc: 123 mg/dL
Hgb A1c MFr Bld: 5.9 % — ABNORMAL HIGH (ref 4.8–5.6)

## 2022-07-15 LAB — MAGNESIUM: Magnesium: 2.2 mg/dL (ref 1.6–2.3)

## 2022-07-15 LAB — VITAMIN B12: Vitamin B-12: 1272 pg/mL — ABNORMAL HIGH (ref 232–1245)

## 2022-07-15 NOTE — Progress Notes (Signed)
Follow up with PCM elevated B12, LDH and Hgba1c.  Low egfr. NP recommend decreasing B12 supplement slightly as too high a level can cause nerve problems also.  NP recommend hydrate with water to keep urine pale yellow/clear and voiding every 2-4 hours when awake, exercise 150 minutes per week; dietary fiber daily by mouth 20 grams women per up to date; eat whole grains/fruits/vegetables; keep added sugars to less than 100 calories/ 5 teaspoons for women per American [employer] Association;  cholesterol, electrolytes, iron, thyroid/liver function, and complete blood count normal  Please let us know if you have further questions.     PCP appt 07/24/22  Be well insurance premium discount evaluation:     Epic reviewed by RN Kimrey and transcribed Labs  Tobacco attestation signed. [facility] ROI formed signed. Forms placed in the chart.   Patient given handouts for [facility] pharmacies and discount drugs list,MyChart, Tele doc setup, Tele doc Behavioral, [facility] counseling and [facility] counseling.  What to do for infectious illness protocol. Given handout for list of medications that can be filled at [facility]. Given Clinic hours and Clinic Email.

## 2022-07-24 ENCOUNTER — Encounter: Payer: Self-pay | Admitting: Family Medicine

## 2022-07-24 ENCOUNTER — Ambulatory Visit (INDEPENDENT_AMBULATORY_CARE_PROVIDER_SITE_OTHER): Payer: No Typology Code available for payment source | Admitting: Family Medicine

## 2022-07-24 VITALS — BP 124/78 | HR 79 | Temp 97.7°F | Ht 63.5 in | Wt 126.5 lb

## 2022-07-24 DIAGNOSIS — I10 Essential (primary) hypertension: Secondary | ICD-10-CM

## 2022-07-24 DIAGNOSIS — E1169 Type 2 diabetes mellitus with other specified complication: Secondary | ICD-10-CM

## 2022-07-24 DIAGNOSIS — Z0000 Encounter for general adult medical examination without abnormal findings: Secondary | ICD-10-CM | POA: Diagnosis not present

## 2022-07-24 DIAGNOSIS — E118 Type 2 diabetes mellitus with unspecified complications: Secondary | ICD-10-CM | POA: Diagnosis not present

## 2022-07-24 DIAGNOSIS — E785 Hyperlipidemia, unspecified: Secondary | ICD-10-CM

## 2022-07-24 DIAGNOSIS — R7402 Elevation of levels of lactic acid dehydrogenase (LDH): Secondary | ICD-10-CM

## 2022-07-24 DIAGNOSIS — D126 Benign neoplasm of colon, unspecified: Secondary | ICD-10-CM

## 2022-07-24 DIAGNOSIS — M8580 Other specified disorders of bone density and structure, unspecified site: Secondary | ICD-10-CM

## 2022-07-24 NOTE — Assessment & Plan Note (Signed)
Dexa 04/2022 No falls or fx  Taking vit D  Good ca from diet Good exercise  Re check at 2 y

## 2022-07-24 NOTE — Assessment & Plan Note (Signed)
Noted from labs at work Other liver tests negative May be due to recent intense exercise  Will continue to watch

## 2022-07-24 NOTE — Progress Notes (Signed)
Subjective:    Patient ID: Laurie Tran, female    DOB: 08-04-1945, 77 y.o.   MRN: [id number]  HPI Here for health maintenance exam and to review chronic medical problems    Wt Readings from Last 3 Encounters:  07/24/22 126 lb 8 oz (57.4 kg)  07/14/22 124 lb 3.2 oz (56.3 kg)  09/25/21 127 lb (57.6 kg)   22.06 kg/m  Feeling good  Taking care of yourself   Exercise Rides stat bike 2 mi per day Rowing machine also   Needs a R knee replacement and exercise helps her pain for now   Still working full time and work has a Hotel manager History  Administered Date(s) Administered   Fluad Quad(high Dose 65+) 02/09/2019, 03/07/2020   Influenza, High Dose Seasonal PF 01/22/2022   Influenza,inj,Quad PF,6+ Mos 01/18/2014, 02/16/2015, 02/08/2016, 02/06/2017, 01/27/2018, 02/26/2021   PFIZER(Purple Top)SARS-COV-2 Vaccination 07/10/2019, 08/02/2019, 03/24/2020   Pneumococcal Conjugate-13 01/18/2014   Pneumococcal Polysaccharide-23 05/28/2012, 01/19/2017   Td 07/18/2021   Tdap 05/30/2011   Zoster Recombinat (Shingrix) 01/26/2022, 07/05/2022   Zoster, Live 02/03/2012   Health Maintenance Due  Topic Date Due   Medicare Annual Wellness (AWV)  Never done   OPHTHALMOLOGY EXAM  12/13/2021   FOOT EXAM  07/18/2022   Mammogram 04/2022 Self breast exam : no lumps   Colonoscopy 10/2017  5 y recall for fam hx   Dexa 04/2022  at [facility]/osteopenia   Falls: none Fractures:none  Supplements  - taking vit D , has balanced diet  Cannot take ca  Exercise : a lot    HTN bp is stable today  No cp or palpitations or headaches or edema  No side effects to medicines  BP Readings from Last 3 Encounters:  07/24/22 124/78  07/14/22 134/80  03/13/22 (!) 169/88    Hctz 25 mg daily  K   Lab Results  Component Value Date   CREATININE 1.00 07/14/2022   BUN 19 07/14/2022   NA 142 07/14/2022   K 4.2 07/14/2022   CL 101 07/14/2022   CO2 26 11/26/2018   GFR 59 H/o kidney stones  (had one removed then uti got better)  Sees urologist   Was on a lot of abx for uti , then took mt abx for a while She hydrates Takes D mannose and probiotic   She is concerned about kidneys    DM2 Lab Results  Component Value Date   HGBA1C 5.9 (H) 07/14/2022   Metformin 500 mg daily     Lab Results  Component Value Date   LABMICR <3.0 07/14/2022   LABMICR See below: 12/21/2020    Cholesterol Lab Results  Component Value Date   CHOL 161 07/14/2022   CHOL 164 07/08/2021   CHOL 159 02/24/2020   Lab Results  Component Value Date   HDL 57 07/14/2022   HDL 56 07/08/2021   HDL 55 02/24/2020   Lab Results  Component Value Date   LDLCALC 87 07/14/2022   LDLCALC 86 07/08/2021   LDLCALC 89 02/24/2020   Lab Results  Component Value Date   TRIG 95 07/14/2022   TRIG 126 07/08/2021   TRIG 76 02/24/2020   Lab Results  Component Value Date   CHOLHDL 2.8 07/14/2022   CHOLHDL 2.9 07/08/2021   CHOLHDL 2.9 02/24/2020   No results found for: "LDLDIRECT"  Atovrastatin 10 mg daily  Does not want to go up on dose  LDL is 87  The 10-year ASCVD risk score (Arnett DK, et al., 2019) is: 31.1%   Values used to calculate the score:     Age: 6 years     Sex: Female     Is Non-Hispanic African American: No     Diabetic: Yes     Tobacco smoker: No     Systolic Blood Pressure: [id number] mmHg     Is BP treated: Yes     HDL Cholesterol: 57 mg/dL     Total Cholesterol: 161 mg/dL    Lab Results  Component Value Date   ALT 12 07/14/2022   AST 17 07/14/2022   GGT 7 07/14/2022   ALKPHOS 115 07/14/2022   BILITOT 0.6 07/14/2022     B12 level was high  Lab Results  Component Value Date   VITAMINB12 1,272 (H) 07/14/2022   No longer takes B12    Had a lot of side effects from shingles vaccine   ? If she is allergic to tylenol or not   LDH was 228  May have been from intense exercise   Patient Active Problem List   Diagnosis Date Noted   Elevated LDH 07/24/2022    Osteopenia 06/08/2022   Kidney stone 0000000   Lichen sclerosus of female genitalia 06/25/2020   Cotton wool spots 12/26/2019   Eye exam abnormal 12/26/2019   Ureteral calculus 11/25/2018   Encounter for hepatitis C screening test for low risk patient 01/19/2017   Estrogen deficiency 0000000   Lichen sclerosus 0000000   H/O herpes zoster 11/13/2011   Routine general medical examination at a health care facility 05/30/2011   Other screening mammogram 05/30/2011   Colon polyps 05/30/2011   Type 2 diabetes mellitus with complication, without long-term current use of insulin ([place]) 10/29/2010   GERD (gastroesophageal reflux disease) 10/29/2010   HTN (hypertension) 10/29/2010   Hyperlipidemia associated with type 2 diabetes mellitus ([place]) 10/29/2010   History of kidney stones 10/29/2010   Perennial allergic rhinitis 10/29/2010   Past Medical History:  Diagnosis Date   Allergic rhinitis    Allergy 1967   Cataract 2020   Removed   Frequency of urination    GERD (gastroesophageal reflux disease)    Hematuria    History of echocardiogram    in epic 12-22-2006  mild AV sclerosis without stenosis,  normal LVF, ef 55-60%,  trivial TR   History of gastroesophageal reflux (GERD)    per pt no issues since stopped drinking soda's   History of kidney stones    History of nuclear stress test    in epic 09-25-2005 normal with no ischemia, ef 69%   Hypertension    Nephrolithiasis    bilateral non-obstructive per CT 11-25-2018   Nocturia    OA (osteoarthritis)    Right ureteral stone    Type 2 diabetes mellitus ([place])    followed by pcp   Urgency of urination    Wears glasses    Past Surgical History:  Procedure Laterality Date   BREAST SURGERY  1985   breast biopsy benign   CYSTOSCOPY W/ URETERAL STENT PLACEMENT Right 11/25/2018   Procedure: CYSTOSCOPY WITH RETROGRADE PYELOGRAM/URETERAL STENT PLACEMENT;  Surgeon: Cleon Gustin, MD;  Location: [facility];  Service: Urology;   Laterality: Right;   CYSTOSCOPY WITH RETROGRADE PYELOGRAM, URETEROSCOPY AND STENT PLACEMENT Left 10/21/2016   Procedure: CYSTOSCOPY WITH RETROGRADE PYELOGRAM, LEFT URETEROSCOPY PYELOSCOPY  AND LEFT STENT PLACEMENT;  Surgeon: Carolan Clines, MD;  Location: [facility];  Service: Urology;  Laterality: Left;  CYSTOSCOPY WITH RETROGRADE PYELOGRAM, URETEROSCOPY AND STENT PLACEMENT Right 12/20/2018   Procedure: CYSTOSCOPY WITH RIGHT RETROGRADE URETEROSCOPY WITH LASER  AND STENT REPLACEMENT;  Surgeon: Cleon Gustin, MD;  Location: [facility];  Service: Urology;  Laterality: Right;   CYSTOSCOPY/RETROGRADE/URETEROSCOPY/STONE EXTRACTION WITH BASKET  yrs ago   CYSTOSCOPY/URETEROSCOPY/HOLMIUM LASER/STENT PLACEMENT Right 09/25/2021   Procedure: CYSTOSCOPY/URETEROSCOPY/ RETROGRADE/HOLMIUM LASER/STENT PLACEMENT;  Surgeon: Ceasar Mons, MD;  Location: [facility];  Service: Urology;  Laterality: Right;   EXTRACORPOREAL SHOCK WAVE LITHOTRIPSY  01-06-2011   '@[facility]'$    EXTRACORPOREAL SHOCK WAVE LITHOTRIPSY Right 08/11/2019   Procedure: EXTRACORPOREAL SHOCK WAVE LITHOTRIPSY (ESWL);  Surgeon: Alexis Frock, MD;  Location: [facility];  Service: Urology;  Laterality: Right;   EXTRACORPOREAL SHOCK WAVE LITHOTRIPSY Left 03/01/2020   Procedure: LEFT EXTRACORPOREAL SHOCK WAVE LITHOTRIPSY (ESWL);  Surgeon: Robley Fries, MD;  [facility];  Service: Urology;  Laterality: Left;   HOLMIUM LASER APPLICATION Right [id number]   Procedure: HOLMIUM LASER APPLICATION;  Surgeon: Cleon Gustin, MD;  Location[facility];  Service: Urology;  Laterality: Right;   KNEE ARTHROSCOPY Right 2006   Social History   Tobacco Use   Smoking status: Never    Passive exposure: Past   Smokeless tobacco: Never  Vaping Use   Vaping Use: Never used  Substance Use Topics   Alcohol use: No   Drug use: No   Family History  Problem  Relation Age of Onset   Cancer Mother        breast CA   Arthritis Paternal Grandmother    Stroke Paternal Grandmother    Diabetes Paternal Grandmother    Asthma Paternal Grandmother    Cancer Sister    Breast cancer Sister    Colon cancer Brother    Cancer Brother    Arthritis Maternal Grandmother    Allergies  Allergen Reactions   Bactrim [Sulfamethoxazole-Trimethoprim] Anaphylaxis and Shortness Of Breath    Pt reported tongue swelling with open sores, ShOB, difficulty swallowing   Methocarbamol Hives and Anaphylaxis    Bronchitis/congestion/coughing   Nsaids Anaphylaxis, Hives and Shortness Of Breath    Breathing problems ;  Per pt "all anti-flammatory's "   Sulfa Antibiotics Anaphylaxis and Shortness Of Breath    Pt reports tongue swelling, ShOB, difficulty swallowing   Celebrex [Celecoxib] Hives and Other (See Comments)    Congestion   Mixed Grasses Other (See Comments)   Molds & Smuts Other (See Comments)   Myrbetriq [place]] Other (See Comments)    Severe myalgias   Other Other (See Comments)    "needs to clear throat often with peanuts"   Tree Extract Other (See Comments)   Vioxx [Rofecoxib] Hives and Other (See Comments)    Congestion   Current Outpatient Medications on File Prior to Visit  Medication Sig Dispense Refill   Albuterol Sulfate (PROAIR RESPICLICK) 123XX123 (90 Base) MCG/ACT AEPB Inhale 1-2 puffs into the lungs every 4 (four) hours as needed. 1 each 0   atorvastatin (LIPITOR) 10 MG tablet TAKE 1 TABLET BY MOUTH EVERY DAY 90 tablet 0   Cholecalciferol 25 MCG (1000 UT) capsule Take 1,000 Units by mouth daily.     Cranberry-Vitamin C-Probiotic (AZO CRANBERRY PO) Take 1 capsule by mouth daily.     D-MANNOSE PO Take by mouth.     diphenhydrAMINE (BENADRYL) 25 MG tablet Take 25 mg by mouth at bedtime.     EPINEPHrine 0.3 mg/0.3 mL IJ SOAJ injection  Inject 0.3 mg into the muscle as needed for anaphylaxis.     estradiol (ESTRACE) 0.1 MG/GM vaginal cream As  directed by urology     fexofenadine (ALLEGRA) 180 MG tablet TAKE 1 TABLET (180 MG TOTAL) BY MOUTH DAILY. (Patient taking differently: Take 180 mg by mouth daily.) 90 tablet 3   GLUCOSAMINE CHONDROITIN COMPLX PO Take by mouth.     hydrochlorothiazide (HYDRODIURIL) 25 MG tablet TAKE 1 TABLET (25 MG TOTAL) BY MOUTH DAILY. 90 tablet 0   Lactobacillus (PROBIOTIC ACIDOPHILUS PO) Take by mouth.     magnesium oxide (MAG-OX) 400 MG tablet Take 400 mg by mouth daily.     metFORMIN (GLUCOPHAGE) 500 MG tablet TAKE 1 TABLET BY MOUTH EVERY DAY WITH BREAKFAST 90 tablet 0   mometasone (NASONEX) 50 MCG/ACT nasal spray Place 2 sprays into the nose daily as needed. (Patient taking differently: Place 2 sprays into the nose daily as needed (allergies).) 17 g 11   Multiple Vitamins-Minerals (PRESERVISION AREDS PO) Take by mouth.     ondansetron (ZOFRAN) 4 MG tablet Take 1 tablet (4 mg total) by mouth daily as needed for nausea or vomiting. 30 tablet 1   potassium chloride (KLOR-CON) 10 MEQ tablet TAKE 1 TABLET BY MOUTH EVERY DAY 90 tablet 0   triamcinolone cream (KENALOG) 0.1 % Apply 1 application topically 2 (two) times daily as needed.     No current facility-administered medications on file prior to visit.       Review of Systems  Constitutional:  Negative for activity change, appetite change, fatigue, fever and unexpected weight change.  HENT:  Negative for congestion, ear pain, rhinorrhea, sinus pressure and sore throat.   Eyes:  Negative for pain, redness and visual disturbance.  Respiratory:  Negative for cough, shortness of breath and wheezing.   Cardiovascular:  Negative for chest pain and palpitations.  Gastrointestinal:  Negative for abdominal pain, blood in stool, constipation and diarrhea.  Endocrine: Negative for polydipsia and polyuria.  Genitourinary:  Negative for dysuria, frequency and urgency.  Musculoskeletal:  Negative for arthralgias, back pain and myalgias.  Skin:  Negative for pallor  and rash.  Allergic/Immunologic: Negative for environmental allergies.  Neurological:  Negative for dizziness, syncope and headaches.  Hematological:  Negative for adenopathy. Does not bruise/bleed easily.  Psychiatric/Behavioral:  Negative for decreased concentration and dysphoric mood. The patient is not nervous/anxious.        Objective:   Physical Exam Constitutional:      General: She is not in acute distress.    Appearance: Normal appearance. She is well-developed and normal weight. She is not ill-appearing or diaphoretic.  HENT:     Head: Normocephalic and atraumatic.     Right Ear: Tympanic membrane, ear canal and external ear normal.     Left Ear: Tympanic membrane, ear canal and external ear normal.     Nose: Nose normal. No congestion.     Mouth/Throat:     Mouth: Mucous membranes are moist.     Pharynx: Oropharynx is clear. No posterior oropharyngeal erythema.  Eyes:     General: No scleral icterus.    Extraocular Movements: Extraocular movements intact.     Conjunctiva/sclera: Conjunctivae normal.     Pupils: Pupils are equal, round, and reactive to light.  Neck:     Thyroid: No thyromegaly.     Vascular: No carotid bruit or JVD.  Cardiovascular:     Rate and Rhythm: Normal rate and regular rhythm.     Pulses: Normal  pulses.     Heart sounds: Normal heart sounds.     No gallop.  Pulmonary:     Effort: Pulmonary effort is normal. No respiratory distress.     Breath sounds: Normal breath sounds. No wheezing.     Comments: Good air exch Chest:     Chest wall: No tenderness.  Abdominal:     General: Bowel sounds are normal. There is no distension or abdominal bruit.     Palpations: Abdomen is soft. There is no mass.     Tenderness: There is no abdominal tenderness.     Hernia: No hernia is present.  Genitourinary:    Comments: Breast exam: No mass, nodules, thickening, tenderness, bulging, retraction, inflamation, nipple discharge or skin changes noted.  No  axillary or clavicular LA.     Musculoskeletal:        General: No tenderness. Normal range of motion.     Cervical back: Normal range of motion and neck supple. No rigidity. No muscular tenderness.     Right lower leg: No edema.     Left lower leg: No edema.     Comments: No kyphosis   Lymphadenopathy:     Cervical: No cervical adenopathy.  Skin:    General: Skin is warm and dry.     Coloration: Skin is not pale.     Findings: No erythema or rash.     Comments: Solar lentigines diffusely   Neurological:     Mental Status: She is alert. Mental status is at baseline.     Cranial Nerves: No cranial nerve deficit.     Motor: No abnormal muscle tone.     Coordination: Coordination normal.     Gait: Gait normal.     Deep Tendon Reflexes: Reflexes are normal and symmetric. Reflexes normal.  Psychiatric:        Mood and Affect: Mood normal.        Cognition and Memory: Cognition and memory normal.           Assessment & Plan:   Problem List Items Addressed This Visit       Cardiovascular and Mediastinum   HTN (hypertension)    bp in fair control at this time  BP Readings from Last 1 Encounters:  07/24/22 124/78  No changes needed Most recent labs reviewed  Disc lifstyle change with low sodium diet and exercise  Plan to continue hctz 25 mg daily with K GFR is 59 Watching this (takes both hctz and metformin)  Enc good fluid intake        Digestive   Colon polyps    Colonoscopy 10/2017 with 5 y recall for family history        Endocrine   Hyperlipidemia associated with type 2 diabetes mellitus ([place])    Disc goals for lipids and reasons to control them Rev last labs with pt Rev low sat fat diet in detail Fair control  LDL of 87 with goal of 70 or below Pt would rather not inc statin at this time Will continue to monitor       Type 2 diabetes mellitus with complication, without long-term current use of insulin ([facility])     Musculoskeletal and Integument    Osteopenia    Dexa 04/2022 No falls or fx  Taking vit D  Good ca from diet Good exercise  Re check at 2 y         Other   Elevated LDH    Noted from  labs at work Other liver tests negative May be due to recent intense exercise  Will continue to watch        Estrogen deficiency   Routine general medical examination at a health care facility - Primary    Reviewed health habits including diet and exercise and skin cancer prevention Reviewed appropriate screening tests for age  Also reviewed health mt list, fam hx and immunization status , as well as social and family history   See HPI Labs reviewed from work Mammogram ute 04/2022  Colonoscopy 10/2027 with 5 y recall Dexa utd 04/2022  , no falls or fx  Enc continued wt bearing exercise

## 2022-07-24 NOTE — Assessment & Plan Note (Signed)
Colonoscopy 10/2017 with 5 y recall for family history

## 2022-07-24 NOTE — Patient Instructions (Addendum)
Think about adding some strength training for exercise   Stay hydrated Eat a balanced diet  Stay active    We will continue to watch your kidney numbers and liver numbers

## 2022-07-24 NOTE — Assessment & Plan Note (Signed)
bp in fair control at this time  BP Readings from Last 1 Encounters:  07/24/22 124/78   No changes needed Most recent labs reviewed  Disc lifstyle change with low sodium diet and exercise  Plan to continue hctz 25 mg daily with K GFR is 59 Watching this (takes both hctz and metformin)  Enc good fluid intake

## 2022-07-24 NOTE — Assessment & Plan Note (Signed)
Disc goals for lipids and reasons to control them Rev last labs with pt Rev low sat fat diet in detail Fair control  LDL of 87 with goal of 70 or below Pt would rather not inc statin at this time Will continue to monitor

## 2022-07-24 NOTE — Assessment & Plan Note (Addendum)
Reviewed health habits including diet and exercise and skin cancer prevention Reviewed appropriate screening tests for age  Also reviewed health mt list, fam hx and immunization status , as well as social and family history   See HPI Labs reviewed from work Mammogram ute 04/2022  Colonoscopy 10/2027 with 5 y recall Dexa utd 04/2022  , no falls or fx  Enc continued wt bearing exercise

## 2022-09-02 ENCOUNTER — Encounter: Payer: Self-pay | Admitting: Registered Nurse

## 2022-09-02 ENCOUNTER — Telehealth: Payer: Self-pay | Admitting: Registered Nurse

## 2022-09-02 DIAGNOSIS — M25561 Pain in right knee: Secondary | ICD-10-CM

## 2022-09-02 NOTE — Telephone Encounter (Signed)
Patient left message for NP "I am having a knee replacement on April 24.  They mentioned that they would put me on a blood thinner such as aspirin.  Since I am allergic to all Anti inflammatories, I was wondering if I am able to take aspirin."    Message left for patient chart reviewed allergy to celecoxib and NSAIDS.  No aspirin allergy noted in her medical records.  Aspirin is an antiplatelet, salicylate which is different than NSAIDS (ibuprofen/advil/aleve/naproxen/naprosyn/dilcofenac/voltaren/mobic/meloxicam)    Typically orthopedics puts people on tylenol/acetaminophen after surgery and a  narcotic short term.  Ensure you remind your surgeon about your NSAID allergy.  From what I could find in the research cross reactivity if you have allergy to one (aspirin or ibuprofen) may not result in allergy to the other for all people.  Some people are not allergic to all NSAIDS in the class also.  They recommend testing with allergist before trying.

## 2022-09-10 ENCOUNTER — Other Ambulatory Visit: Payer: No Typology Code available for payment source | Admitting: Occupational Medicine

## 2022-09-10 DIAGNOSIS — Z01818 Encounter for other preprocedural examination: Secondary | ICD-10-CM

## 2022-09-10 NOTE — Progress Notes (Signed)
Lab drawn from right AC tolerated well no issues noted. Lab orders from [facility] for pre procedural exam.

## 2022-09-11 ENCOUNTER — Other Ambulatory Visit: Payer: Self-pay | Admitting: Registered Nurse

## 2022-09-11 DIAGNOSIS — E118 Type 2 diabetes mellitus with unspecified complications: Secondary | ICD-10-CM

## 2022-09-11 LAB — HGB A1C W/O EAG: Hgb A1c MFr Bld: 6 % — ABNORMAL HIGH (ref 4.8–5.6)

## 2022-09-11 LAB — CBC WITH DIFFERENTIAL/PLATELET
Basophils Absolute: 0.1 10*3/uL (ref 0.0–0.2)
Basos: 1 %
EOS (ABSOLUTE): 0.2 10*3/uL (ref 0.0–0.4)
Eos: 3 %
Hematocrit: 40.3 % (ref 34.0–46.6)
Hemoglobin: 13.6 g/dL (ref 11.1–15.9)
Immature Grans (Abs): 0 10*3/uL (ref 0.0–0.1)
Immature Granulocytes: 0 %
Lymphocytes Absolute: 2.1 10*3/uL (ref 0.7–3.1)
Lymphs: 26 %
MCH: 29.6 pg (ref 26.6–33.0)
MCHC: 33.7 g/dL (ref 31.5–35.7)
MCV: 88 fL (ref 79–97)
Monocytes Absolute: 0.5 10*3/uL (ref 0.1–0.9)
Monocytes: 6 %
Neutrophils Absolute: 5.2 10*3/uL (ref 1.4–7.0)
Neutrophils: 64 %
Platelets: 272 10*3/uL (ref 150–450)
RBC: 4.59 x10E6/uL (ref 3.77–5.28)
RDW: 13 % (ref 11.7–15.4)
WBC: 8.1 10*3/uL (ref 3.4–10.8)

## 2022-09-11 LAB — COMPREHENSIVE METABOLIC PANEL
ALT: 27 IU/L (ref 0–32)
AST: 29 IU/L (ref 0–40)
Albumin/Globulin Ratio: 2 (ref 1.2–2.2)
Albumin: 4.5 g/dL (ref 3.8–4.8)
Alkaline Phosphatase: 114 IU/L (ref 44–121)
BUN/Creatinine Ratio: 22 (ref 12–28)
BUN: 21 mg/dL (ref 8–27)
Bilirubin Total: 0.6 mg/dL (ref 0.0–1.2)
CO2: 25 mmol/L (ref 20–29)
Calcium: 10 mg/dL (ref 8.7–10.3)
Chloride: 104 mmol/L (ref 96–106)
Creatinine, Ser: 0.95 mg/dL (ref 0.57–1.00)
Globulin, Total: 2.3 g/dL (ref 1.5–4.5)
Glucose: 113 mg/dL — ABNORMAL HIGH (ref 70–99)
Potassium: 4 mmol/L (ref 3.5–5.2)
Sodium: 144 mmol/L (ref 134–144)
Total Protein: 6.8 g/dL (ref 6.0–8.5)
eGFR: 63 mL/min/{1.73_m2} (ref >59)

## 2022-09-11 NOTE — Telephone Encounter (Signed)
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  Discussed with patient that I would not take again as repetitive dosing could results in worse symptoms.  She stated it did help to relieve knee pain and she was glad.  She thinks she was doing too many extension exercises and inflamed the arthritis this weekend.  Gait sure and steady skin warm dry and pink gait sure and steady A&Ox3 respirations even and unlabored no oral lesions noted.

## 2022-09-25 ENCOUNTER — Telehealth: Payer: Self-pay | Admitting: Registered Nurse

## 2022-09-25 ENCOUNTER — Encounter: Payer: Self-pay | Admitting: Registered Nurse

## 2022-09-25 NOTE — Telephone Encounter (Signed)
Epic reviewed patient had right TKA 09/17/22  PT started 4/26 initial and follow up completed today.  Message left for patient checking in to see if she needs anything from clinic staff.  Patient has message will be out of the office until June.

## 2022-09-29 ENCOUNTER — Other Ambulatory Visit: Payer: Self-pay | Admitting: Family Medicine

## 2022-10-19 NOTE — Telephone Encounter (Signed)
Epic reviewed last PT appt 10/16/22

## 2022-11-04 ENCOUNTER — Other Ambulatory Visit: Payer: Self-pay | Admitting: Urology

## 2022-11-04 NOTE — H&P (Signed)
CC: Kidney stones   HPI: Ms. Bickle is a 77 year old female with a history of kidney stones and recurrent UTIs. Hx of urethral dilation 40+ years ago.   01/23/20: The patient is here today for a routine f/u. Previously followed by Dr. Ronne Binning and Dr. Sherron Monday. She is doing well and denies interval episodes of flank pain, stone passage, dysuria or hematuria.   04/02/20: Patient with above noted hx. She was noted to have an approximately 5 mm left renal calculus on KUB at prior OV. She is s/p left ESWL on 10/7 for treatment. She presents today for follow up. She denies any passage of stone material following her procedure. She currently denies any flank or abdominal pain. No complaints of dysuria or gross hematuria. She currently feels she is voiding at baseline. No complaints of fever, chills, nausea, or vomiting.   05/30/20: Patient with above history. She returns today for ongoing evaluation. KUB at prior office visit revealed residual stone within the left renal shadow. There was also a questionable opacity noted on the right. Patient elected for continued observation at that time. She denies any interval stone passage. She denies any current flank or abdominal pain. She currently feels she is voiding at baseline. She feels she is voiding normal volumes. No complaints of fever, chills, nausea, or vomiting.   07/20/2020: Patient with above noted past medical history. She presents today for symptom evaluation and urinalysis. She denies any interval pain, discomfort or passage of stone material. She feels she is voiding at her baseline. She denies fevers, chills nausea and vomiting. She denies gross hematuria.   11/15/2020: 77 year old female with the above noted past medical history. She presents today with concerns of uti, flank and lower left abdominal discomfort and increased urgency and frequency. She denies fevers. She is experiencing some slight nausea that comes and goes. She has some known left  sided stones, she has not seen a stone pass.   11/27/2020: She presents today with continued symptoms of UTI including flank pain, lower abdominal discomfort, urgency and frequency. She also has some slight nausea that comes and goes. She denies any fevers or chills.   12/07/2020: 77 year old female with the above noted past medical history. She has been on 3 antibiotics over the last month. Her CT scan did not show obstructive uropathy. She continues to have bothersome urgency, frequency and dysuria which is associated with pelvic pressure. This came on suddenly. Her symptoms do slightly improved while on antibiotics but never fully resolved. She denies fevers, chills. No complaints of gross hematuria. She does continue to have some lower abdominal discomfort.   02/07/21: Ms. Stracener is here today for a routine follow-up. She recent complete a course of cipro (prescribed by her PCP) for a suspected UTI. Today, she reports intialy improvement in her urinary sxs, but states that she developed mild dysuria and frequency q 2 hours last night. She denies flank pain, fever/chills, hematuria or suprapubic pain today. At baseline, she reports mild SUI and requires adult pads when she has a UTI due exacerbation of her leakage. She also states that she will occasionally have to strain in order to completely empty her bladder. CT from June revealed a non-obstructing right renal stone with no other GU abnormalities.   05/09/21: The patient is here today for a routine follow-up. She has done well over the past 3 months and denies interval UTIs. She continues to require daily Keflex for UTI prophylaxis. She reports a good force of  stream is emptying her bladder well with minimal dysuria and no hematuria. She continues to endorse that she has to sometimes Crede in order to initiate her stream.   09/06/21: The patient is here today for routine follow-up. She is currently on daily cephalexin and states that if she tries to  stop the medication, her UTI symptoms return. She denies interval episodes of hematuria, but reports left-sided flank pain. CT from September 2022 revealed an 8 mm right renal stone with no stone burden on the left. She states that she has been adherent with vaginal Estrace along with other UTI preventative measures.   10/03/2021: 77 year old female who presents today for stent removal. She underwent a right-sided ureteroscopy for an 8 mm right renal stone. She did well postoperatively. She is on a maintenance dose of 250 mg of cephalexin. She tolerates this well. She denies any interval fevers or chills. No complaints of flank pain.   11/13/20: 77 year old female who presents today for office visit and renal ultrasound after she underwent a right-sided ureteroscopy. She has been doing well and denies any interval renal colic or gross hematuria. She denies fevers, chills. She has not had any voiding complaints.   05/05/2022: Ms. Diego is a 77 year old female who presents today for follow-up. She has been doing well over the past 6 months and is no longer on daily cephalexin. She denies flank pain, gross hematuria, fevers, chills. Renal ultrasound looks reassuring today. Continues the use of vaginal Estrace cream and UTI prevention strategies.     ALLERGIES: Bextra TABS CVS Ibuprofen CAPS Methocarbamol - Swelling, Hives NSAIDs - Hives, Bronchitis Sulfa    MEDICATIONS: Estrace 0.01 % cream with applicator apply a fingertip amount twice per week to the urethra.  Azo  Co Q-10 200 mg capsule  Fexofenadine HCl - 180 MG Oral Tablet Oral  Glucosamine  Hydrochlorothiazide 25 mg tablet 0 Oral  Lipitor 10 MG Oral Tablet Oral  MetFORMIN HCl - 500 MG Oral Tablet Oral  Multiple Vitamin  Nasonex SUSP Nasal  Potassium  Preservision Areds  Tramadol Hcl 50 mg tablet 1 tablet PO Q 6 H PRN     GU PSH: Catheterize For Residual - 2020 Cysto Remove Stent FB Sim - 10/03/2021, 2020 Cystoscopy Insert Stent, Right -  2020, Left - 2018 Cystoscopy Ureteroscopy, Left - 2018 ESWL, Left - 2021, Right - 2021, 2012, 2012 Ureteroscopic laser litho - 09/25/2021, Right - 2020       PSH Notes: Lithotripsy, Cystourethroscopy, Lithotripsy, Breast Surgery   NON-GU PSH: Breast Surgery Procedure - 2012         GU PMH: Renal and ureteral calculus - 11/13/2021, - 10/03/2021, - 2022, - 2021 Chronic cystitis (w/o hematuria) - 09/06/2021, - 05/09/2021, - 02/07/2021 Renal calculus - 09/06/2021, - 11/27/2020, - 11/15/2020, - 2022, - 2022, - 2021, - 2021, - 2021, - 2020, - 2020, - 2020, - 2017 Stress Incontinence - 02/07/2021 Acute Cystitis/UTI - 12/07/2020, - 11/27/2020, - 11/15/2020 Suprapubic pain - 12/07/2020 History of urolithiasis (Stable) - 2018, Nephrolithiasis, - 2014 Flank Pain - 2018, - 2018 Ureteral calculus (Worsening), Left - 2018 Urinary Frequency - 2017 Benign neoplasm of unspecified ovary, Dermoid cyst of ovary - 2014      PMH Notes:  1898-05-26 00:00:00 - Note: Normal Routine History And Physical Adult  2011-07-22 08:24:09 - Note: Nephrolithiasis Of The Right Kidney  2010-11-21 15:52:19 - Note: Arthritis   NON-GU PMH: Bacteriuria - 2020 Cardiac murmur, unspecified, Murmurs - 2014 Personal history of other diseases  of the circulatory system, History of hypertension - 2014 Personal history of other endocrine, nutritional and metabolic disease, History of diabetes mellitus - 2014, History of hypercholesterolemia, - 2014 Personal history of other specified conditions, History of heartburn - 2014 Heartburn    FAMILY HISTORY: 1 Daughter - Other 1 son - Other Acute Lymphoma - Mother Breast Cancer - Mother, Sister Colon Cancer - Brother father deceased at age 61 - Other patient's mother is still living - Other    Notes: Mother is 4  Father died in a traffic accident   SOCIAL HISTORY: Marital Status: Married Preferred Language: English; Ethnicity: Not Hispanic Or Latino; Race: White Current Smoking Status:  Patient has never smoked.  Has never drank.  Does not drink caffeine. Patient's occupation Museum/gallery exhibitions officer.    REVIEW OF SYSTEMS:     GU Review Female:  Patient denies frequent urination, hard to postpone urination, burning /pain with urination, get up at night to urinate, leakage of urine, stream starts and stops, trouble starting your stream, have to strain to urinate, and being pregnant.    Gastrointestinal (Upper):  Patient denies nausea, vomiting, and indigestion/ heartburn.    Gastrointestinal (Lower):  Patient denies diarrhea and constipation.    Constitutional:  Patient denies fever, night sweats, weight loss, and fatigue.    Skin:  Patient denies skin rash/ lesion and itching.    Eyes:  Patient denies blurred vision and double vision.    Musculoskeletal:  Patient denies back pain and joint pain.    Neurological:  Patient denies headaches and dizziness.    Psychologic:  Patient denies depression and anxiety.    VITAL SIGNS: None     MULTI-SYSTEM PHYSICAL EXAMINATION:      Constitutional: Well-nourished. No physical deformities. Normally developed. Good grooming.     Respiratory: No labored breathing, no use of accessory muscles.      Cardiovascular: Normal temperature, normal extremity pulses, no swelling, no varicosities.     Skin: No paleness, no jaundice, no cyanosis. No lesion, no ulcer, no rash.     Neurologic / Psychiatric: Oriented to time, oriented to place, oriented to person. No depression, no anxiety, no agitation.     Gastrointestinal: No mass, no tenderness, no rigidity, non obese abdomen.            Complexity of Data:   Source Of History:  Patient  Records Review:  Previous Doctor Records, Previous Patient Records  Urine Test Review:  Urinalysis  X-Ray Review: Renal Ultrasound: Reviewed Films. Reviewed Report. Discussed With Patient.      05/05/22  Urinalysis  Urine Appearance Clear   Urine Specimen Voided   Urine Color Straw   Urine Glucose Neg    Urine Bilirubin Neg   Urine Ketones Neg   Urine Specific Gravity 1.010   Urine Blood Neg   Urine pH 6.5   Urine Protein Neg   Urine Urobilinogen 0.2   Urine Nitrites Neg   Urine Leukocyte Esterase Neg    PROCEDURES:    Renal Ultrasound - 40981  Right Kidney: Length: 10.04 cm Depth: 5.04 cm Cortical Width: 0.83 cm Width: 4.58 cm  Left Kidney: Length: 10.63 cm Depth: 4.43 cm Cortical Width: 1.08 cm Width: 4.35 cm  Left Kidney/Ureter:  1)? 0.52cm Mid Pole Stone----2) Subcentimeter hyperechoic areas with and without shadowing - ? stones vs vascular calcifications --Refer to [place] [facility]  Right Kidney/Ureter:  1)? 1.23cm Upper Pole Stone----2)? 0.83cm Mid Pole Stone----3)0.73cm Mid to Lower Pole Cystic Area----4) Subcentimeter hyperechoic  areas with and without shadowing largest in Upper to [facility] measuring 0.67cm and 0.44cm in Lower Pole- ? stones vs vascular calcifications ---Refer to [place] [facility]  Bladder:  PVR = 145.36ml      Patient confirmed No Neulasta OnPro Device.      Urinalysis - 81003  Dipstick Dipstick Cont'd  Specimen: Voided Bilirubin: Neg  Color: Straw Ketones: Neg  Appearance: Clear Blood: Neg  Specific Gravity: 1.010 Protein: Neg  pH: 6.5 Urobilinogen: 0.2  Glucose: Neg Nitrites: Neg   Leukocyte Esterase: Neg    ASSESSMENT:     ICD-10 Details  1 GU:  Chronic cystitis (w/o hematuria) - N30.20 Chronic, Stable  2  Renal calculus - N20.0 Chronic, Stable   PLAN:   Medications  Refill Meds: Estrace 0.01 % cream with applicator apply a fingertip amount twice per week to the urethra. #1 11 Refill(s)    Orders    Schedule  X-Rays: 6 Months - Renal Ultrasound   6 Months - KUB  Return Visit/Planned Activity: 6 Months - Office Visit, Extender, KUB, Renal Ultrasound  Document  Letter(s):  Created for Patient: Clinical Summary   Notes:  1. Nephrolithiasis: Ultrasound looks very reassuring today. Advised follow-up in 6 months for KUB and renal ultrasound.  General stone prevention strategies were discussed in detail today including: Increased hydration 2-1/2-3 and half liters of water per day, limiting sodium intake to less than 3000 mg/day, limiting animal protein to less than 10 ounces per day, the addition of lemon to water, maintaining a normal calcium diet, and limiting oxalate rich foods.

## 2022-11-05 NOTE — Progress Notes (Signed)
Left voicemail for patient to return call for instructions ([email or web address])

## 2022-11-05 NOTE — H&P (Signed)
CC: Kidney stones   HPI: Laurie Tran is a 77 year old female with a history of kidney stones and recurrent UTIs. Hx of urethral dilation 40+ years ago.   01/23/20: The patient is here today for a routine f/u. Previously followed by Dr. Ronne Binning and Dr. Sherron Monday. She is doing well and denies interval episodes of flank pain, stone passage, dysuria or hematuria.   04/02/20: Patient with above noted hx. She was noted to have an approximately 5 mm left renal calculus on KUB at prior OV. She is s/p left ESWL on 10/7 for treatment. She presents today for follow up. She denies any passage of stone material following her procedure. She currently denies any flank or abdominal pain. No complaints of dysuria or gross hematuria. She currently feels she is voiding at baseline. No complaints of fever, chills, nausea, or vomiting.   05/30/20: Patient with above history. She returns today for ongoing evaluation. KUB at prior office visit revealed residual stone within the left renal shadow. There was also a questionable opacity noted on the right. Patient elected for continued observation at that time. She denies any interval stone passage. She denies any current flank or abdominal pain. She currently feels she is voiding at baseline. She feels she is voiding normal volumes. No complaints of fever, chills, nausea, or vomiting.   07/20/2020: Patient with above noted past medical history. She presents today for symptom evaluation and urinalysis. She denies any interval pain, discomfort or passage of stone material. She feels she is voiding at her baseline. She denies fevers, chills nausea and vomiting. She denies gross hematuria.   11/15/2020: 77 year old female with the above noted past medical history. She presents today with concerns of uti, flank and lower left abdominal discomfort and increased urgency and frequency. She denies fevers. She is experiencing some slight nausea that comes and goes. She has some known left  sided stones, she has not seen a stone pass.   11/27/2020: She presents today with continued symptoms of UTI including flank pain, lower abdominal discomfort, urgency and frequency. She also has some slight nausea that comes and goes. She denies any fevers or chills.   12/07/2020: 77 year old female with the above noted past medical history. She has been on 3 antibiotics over the last month. Her CT scan did not show obstructive uropathy. She continues to have bothersome urgency, frequency and dysuria which is associated with pelvic pressure. This came on suddenly. Her symptoms do slightly improved while on antibiotics but never fully resolved. She denies fevers, chills. No complaints of gross hematuria. She does continue to have some lower abdominal discomfort.   02/07/21: Laurie Tran is here today for a routine follow-up. She recent complete a course of cipro (prescribed by her PCP) for a suspected UTI. Today, she reports intialy improvement in her urinary sxs, but states that she developed mild dysuria and frequency q 2 hours last night. She denies flank pain, fever/chills, hematuria or suprapubic pain today. At baseline, she reports mild SUI and requires adult pads when she has a UTI due exacerbation of her leakage. She also states that she will occasionally have to strain in order to completely empty her bladder. CT from June revealed a non-obstructing right renal stone with no other GU abnormalities.   05/09/21: The patient is here today for a routine follow-up. She has done well over the past 3 months and denies interval UTIs. She continues to require daily Keflex for UTI prophylaxis. She reports a good force of  stream is emptying her bladder well with minimal dysuria and no hematuria. She continues to endorse that she has to sometimes Crede in order to initiate her stream.   09/06/21: The patient is here today for routine follow-up. She is currently on daily cephalexin and states that if she tries to  stop the medication, her UTI symptoms return. She denies interval episodes of hematuria, but reports left-sided flank pain. CT from September 2022 revealed an 8 mm right renal stone with no stone burden on the left. She states that she has been adherent with vaginal Estrace along with other UTI preventative measures.   10/03/2021: 77 year old female who presents today for stent removal. She underwent a right-sided ureteroscopy for an 8 mm right renal stone. She did well postoperatively. She is on a maintenance dose of 250 mg of cephalexin. She tolerates this well. She denies any interval fevers or chills. No complaints of flank pain.   11/13/20: 77 year old female who presents today for office visit and renal ultrasound after she underwent a right-sided ureteroscopy. She has been doing well and denies any interval renal colic or gross hematuria. She denies fevers, chills. She has not had any voiding complaints.   05/05/2022: Laurie Tran is a 77 year old female who presents today for follow-up. She has been doing well over the past 6 months and is no longer on daily cephalexin. She denies flank pain, gross hematuria, fevers, chills. Renal ultrasound looks reassuring today. Continues the use of vaginal Estrace cream and UTI prevention strategies.   11/04/2022: Laurie Tran is a 77 year old female that presents for 35-month follow-up. She underwent a KUB and renal ultrasound prior to her appointment. Unfortunately, she had some dilation of her right renal pelvis and on KUB there was a 1 cm stone that appears to be within the UPJ. Is well-visualized on KUB. She is not having pain or discomfort. She denies nausea and vomiting. She has done well with lithotripsies in the past.     ALLERGIES: Bextra TABS [facility] Ibuprofen CAPS Methocarbamol - Swelling, Hives NSAIDs - Hives, Bronchitis Sulfa    MEDICATIONS: Estrace 0.01 % cream with applicator apply a fingertip amount twice per week to the urethra.  Azo  Co Q-10 200 mg  capsule  Fexofenadine HCl - 180 MG Oral Tablet Oral  Glucosamine  Hydrochlorothiazide 25 mg tablet 0 Oral  Lipitor 10 MG Oral Tablet Oral  MetFORMIN HCl - 500 MG Oral Tablet Oral  Multiple Vitamin  Nasonex SUSP Nasal  Potassium  Preservision Areds  Tramadol Hcl 50 mg tablet 1 tablet PO Q 6 H PRN     GU PSH: Catheterize For Residual - 2020 Cysto Remove Stent FB Sim - 10/03/2021, 2020 Cystoscopy Insert Stent, Right - 2020, Left - 2018 Cystoscopy Ureteroscopy, Left - 2018 ESWL, Left - 2021, Right - 2021, 2012, 2012 Ureteroscopic laser litho - 09/25/2021, Right - 2020       PSH Notes: Lithotripsy, Cystourethroscopy, Lithotripsy, Breast Surgery   NON-GU PSH: Breast Surgery Procedure - 2012     GU PMH: Chronic cystitis (w/o hematuria) - 05/05/2022, - 09/06/2021, - 05/09/2021, - 02/07/2021 Renal calculus - 05/05/2022, - 09/06/2021, - 11/27/2020, - 11/15/2020, - 2022, - 2022, - 2021, - 2021, - 2021, - 2020, - 2020, - 2020, - 2017 Renal and ureteral calculus - 11/13/2021, - 10/03/2021, - 2022, - 2021 Stress Incontinence - 02/07/2021 Acute Cystitis/UTI - 12/07/2020, - 11/27/2020, - 11/15/2020 Suprapubic pain - 12/07/2020 History of urolithiasis (Stable) - 2018, Nephrolithiasis, - 2014 Flank Pain - 2018, -  2018 Ureteral calculus (Worsening), Left - 2018 Urinary Frequency - 2017 Benign neoplasm of unspecified ovary, Dermoid cyst of ovary - 2014      PMH Notes:  1898-05-26 00:00:00 - Note: Normal Routine History And Physical Adult  2011-07-22 08:24:09 - Note: Nephrolithiasis Of The Right Kidney  2010-11-21 15:52:19 - Note: Arthritis   NON-GU PMH: Bacteriuria - 2020 Cardiac murmur, unspecified, Murmurs - 2014 Personal history of other diseases of the circulatory system, History of hypertension - 2014 Personal history of other endocrine, nutritional and metabolic disease, History of hypercholesterolemia - 2014, History of diabetes mellitus, - 2014 Personal history of other specified conditions,  History of heartburn - 2014 Heartburn    FAMILY HISTORY: 1 Daughter - Other 1 son - Other Acute Lymphoma - Mother Breast Cancer - Mother, Sister Colon Cancer - Brother father deceased at age 45 - Other patient's mother is still living - Other    Notes: Mother is 54  Father died in a traffic accident   SOCIAL HISTORY: Marital Status: Married Preferred Language: English; Ethnicity: Not Hispanic Or Latino; Race: White Current Smoking Status: Patient has never smoked.  Has never drank.  Does not drink caffeine. Patient's occupation Museum/gallery exhibitions officer.    REVIEW OF SYSTEMS:    GU Review Female:   Patient denies frequent urination, hard to postpone urination, burning /pain with urination, get up at night to urinate, leakage of urine, stream starts and stops, trouble starting your stream, have to strain to urinate, and being pregnant.  Gastrointestinal (Upper):   Patient denies nausea, vomiting, and indigestion/ heartburn.  Gastrointestinal (Lower):   Patient denies diarrhea and constipation.  Constitutional:   Patient denies fever, night sweats, weight loss, and fatigue.  Skin:   Patient denies skin rash/ lesion and itching.  Eyes:   Patient denies blurred vision and double vision.  Ears/ Nose/ Throat:   Patient denies sore throat and sinus problems.  Musculoskeletal:   Patient denies back pain and joint pain.  Neurological:   Patient denies headaches and dizziness.  Psychologic:   Patient denies depression and anxiety.   VITAL SIGNS: None   MULTI-SYSTEM PHYSICAL EXAMINATION:    Constitutional: Well-nourished. No physical deformities. Normally developed. Good grooming.  Respiratory: No labored breathing, no use of accessory muscles.   Cardiovascular: Normal temperature, normal extremity pulses, no swelling, no varicosities.  Skin: No paleness, no jaundice, no cyanosis. No lesion, no ulcer, no rash.  Neurologic / Psychiatric: Oriented to time, oriented to place, oriented to person.  No depression, no anxiety, no agitation.  Gastrointestinal: No mass, no tenderness, no rigidity, non obese abdomen.     Complexity of Data:  Source Of History:  Patient  Records Review:   Previous Doctor Records, Previous Patient Records  X-Ray Review: KUB: Reviewed Films. Reviewed Report. Discussed With Patient.     PROCEDURES:         KUB - [id number]  A single view of the abdomen is obtained. 10mm calcification noted on the right near the level of L2       . Patient confirmed No Neulasta OnPro Device.            Renal Ultrasound - [address]  Right Kidney: Length: 10.43cm Depth: 4.32cm Cortical Width: 0.96 cm Width: 4.61cm  Left Kidney: Length: 10.15 cm Depth: 4.35 cm Cortical Width: 0.99 cm Width: 4.98 cm  Left Kidney/Ureter:  vascular calcifications noted throughout the kidney   Right Kidney/Ureter:  dilated renal pelvis, small non obstructive calc, small sub centimeter cyst,  vascular calcifications noted throughout the kidney  Bladder:  PVR 35.40ml      . Patient confirmed No Neulasta OnPro Device.     ASSESSMENT:      ICD-10 Details  1 GU:   Renal calculus - N20.0 Right, Acute, Uncomplicated   PLAN:            Medications New Meds: Tamsulosin Hcl 0.4 mg capsule 1 capsule PO Q HS   #30  0 Refill(s)  Hydrocodone-Acetaminophen 5 mg-325 mg tablet 1 tablet PO Q 6 H PRN kidney stone pain  #20  0 Refill(s)  Ondansetron Odt 8 mg tablet,disintegrating 1 tablet PO Q 8 H PRN nausea  #20  0 Refill(s)  Pharmacy Name:  [id number]  Address:  [address]   [place], [place] [address]  Phone:  [phone]  Fax:  [phone]            Document Letter(s):  Created for Patient: Clinical Summary         Notes:   Right calcification noted near the level of L2 during approximately 10 mm.   Stone intervention was discussed in detail today. For ureteroscopy, the patient understands that there is a chance for a staged procedure. Patient also understands that there is  risk for bleeding, infection, injury to surrounding organs, and general risks of anesthesia. The patient also understands the placement of a stent and the risks of stent placement including, risk for infection, the risk for pain, and the risk for injury. For ESWL, the patient understands that there is a chance of failure of procedure, there is also a risk for bruising, infection, bleeding, and injury to surrounding structures. The patient verbalized understanding to these risks.   She would like to proceed with lithotripsy and understands that there is a chance that the stone does not break up as well due to its large size. Lithotripsy sheet was placed today with the appropriate scheduler.

## 2022-11-06 ENCOUNTER — Encounter (HOSPITAL_BASED_OUTPATIENT_CLINIC_OR_DEPARTMENT_OTHER): Payer: Self-pay | Admitting: Urology

## 2022-11-06 NOTE — Progress Notes (Signed)
Left voicemail on Tuesday, 6/11 to return call to [facility] center: [phone].  Left voicemail today at [phone] to call [phone] as soon as possible for instructions for procedure.

## 2022-11-06 NOTE — Progress Notes (Signed)
Attempted pre-op phone call. Called patients spouse and left voicemail. Also called number that [facility] gave and the patient did not answer.            [phone]) Voicemail was left and asked patient to call back at earliest convenience.

## 2022-11-06 NOTE — Progress Notes (Signed)
Pre-op phone call complete. Procedure date and arrival time verified. Patient allergies, medications, and medical history confirmed. Patient not to take metformin or hydrachlorthiazide DOS. Patient to be NPO at midnight. Driver secured.

## 2022-11-06 NOTE — Progress Notes (Signed)
Left voicemail for Pam, surgery scheduler for Dr. Sherron Monday, to inform them that we have been unable to reach this patient.

## 2022-11-07 ENCOUNTER — Other Ambulatory Visit: Payer: Self-pay

## 2022-11-07 ENCOUNTER — Ambulatory Visit (HOSPITAL_BASED_OUTPATIENT_CLINIC_OR_DEPARTMENT_OTHER)
Admission: RE | Admit: 2022-11-07 | Discharge: 2022-11-07 | Disposition: A | Discharge status: Home / Self Care | Payer: No Typology Code available for payment source | Attending: Urology | Admitting: Urology

## 2022-11-07 ENCOUNTER — Encounter (HOSPITAL_BASED_OUTPATIENT_CLINIC_OR_DEPARTMENT_OTHER)
Admission: RE | Disposition: A | Discharge status: Home / Self Care | Payer: Self-pay | Source: Home / Self Care | Attending: Urology

## 2022-11-07 ENCOUNTER — Ambulatory Visit (HOSPITAL_COMMUNITY): Payer: No Typology Code available for payment source

## 2022-11-07 ENCOUNTER — Encounter (HOSPITAL_BASED_OUTPATIENT_CLINIC_OR_DEPARTMENT_OTHER): Payer: Self-pay | Admitting: Urology

## 2022-11-07 DIAGNOSIS — N200 Calculus of kidney: Secondary | ICD-10-CM | POA: Diagnosis present

## 2022-11-07 HISTORY — PX: EXTRACORPOREAL SHOCK WAVE LITHOTRIPSY: SHX1557

## 2022-11-07 LAB — GLUCOSE, CAPILLARY: Glucose-Capillary: 74 mg/dL (ref 70–99)

## 2022-11-07 SURGERY — LITHOTRIPSY, ESWL
Anesthesia: LOCAL | Laterality: Right

## 2022-11-07 MED ORDER — DIAZEPAM 5 MG PO TABS
ORAL_TABLET | ORAL | Status: AC
  Filled 2022-11-07: qty 1

## 2022-11-07 MED ORDER — DIPHENHYDRAMINE HCL 25 MG PO CAPS
ORAL_CAPSULE | ORAL | Status: AC
  Filled 2022-11-07: qty 1

## 2022-11-07 MED ORDER — DIAZEPAM 5 MG PO TABS
ORAL_TABLET | ORAL | Status: AC
  Filled 2022-11-07: qty 2

## 2022-11-07 MED ORDER — CIPROFLOXACIN HCL 500 MG PO TABS
500.0000 mg | ORAL_TABLET | ORAL | Status: AC
  Administered 2022-11-07: 500 mg via ORAL

## 2022-11-07 MED ORDER — SODIUM CHLORIDE 0.9 % IV SOLN: INTRAVENOUS | Status: DC

## 2022-11-07 MED ORDER — CIPROFLOXACIN HCL 500 MG PO TABS
ORAL_TABLET | ORAL | Status: AC
  Filled 2022-11-07: qty 1

## 2022-11-07 MED ORDER — DIAZEPAM 5 MG PO TABS: 10.0000 mg | ORAL_TABLET | ORAL | Status: DC

## 2022-11-07 MED ORDER — DIAZEPAM 5 MG PO TABS
5.0000 mg | ORAL_TABLET | Freq: Once | ORAL | Status: AC
  Administered 2022-11-07: 5 mg via ORAL

## 2022-11-07 MED ORDER — DIPHENHYDRAMINE HCL 25 MG PO CAPS
25.0000 mg | ORAL_CAPSULE | ORAL | Status: AC
  Administered 2022-11-07: 25 mg via ORAL

## 2022-11-07 NOTE — Discharge Instructions (Signed)
I have reviewed discharge instructions in detail with the patient. They will follow-up with me or their physician as scheduled. My nurse will also be calling the patients as per protocol.   I WILL CALL PATIENT AND WE  WILL RESCAN HER IN CLINIC

## 2022-11-07 NOTE — Progress Notes (Signed)
Reviewed all films including distant CT Cannot see large stone on KUB Cannot see stone on multiple fluoro Gave dye after  checking labs Images were VERY clear= no stone in kidney or ureter; mild prominent renal pelvis normal; ureter not dilated 100% normal full length No filling defects Patient had pain 3 days ago Will need to stop procedure

## 2022-11-10 ENCOUNTER — Encounter (HOSPITAL_BASED_OUTPATIENT_CLINIC_OR_DEPARTMENT_OTHER): Payer: Self-pay | Admitting: Urology

## 2022-12-16 ENCOUNTER — Telehealth: Payer: Self-pay | Admitting: Registered Nurse

## 2022-12-16 DIAGNOSIS — U071 COVID-19: Secondary | ICD-10-CM

## 2022-12-16 MED ORDER — NIRMATRELVIR/RITONAVIR (PAXLOVID)TABLET: 3.0000 | ORAL_TABLET | Freq: Two times a day (BID) | ORAL | 0 refills | Status: DC

## 2022-12-16 NOTE — Telephone Encounter (Signed)
Patient contacted NP stated positive home test today.  Started to run fever yesterday.  Day 0 fatigue, headache, sore throat, body aches, cough and congestion/head cold stayed home today Pt began quarantine at that time. Patient did not develop symptoms of  trouble breathing, chest pain, nausea, vomiting, or diarrhea   5 day quarantine per [facility] recommendations. Day 1 of quarantine 12/16/22. Patient to contact clinic staff if vomiting after coughing or unable to tolerate po fluids.  Discussed flu and other viral illnesses circulating in community and some causing GI upset.  If GI upset I have recommended clear fluids then bland diet.  Avoid dairy/spicy, fried and large portions of meat while having nausea.  If vomiting hold po intake x 1 hour.  Then sips clear fluids like broths, ginger ale, power ade, gatorade, pedialyte may advance to soft/bland if no vomiting x 24 hours and appetite returned otherwise hydration main focus. Call me at work from home number if symptoms not improved with plan of care  patient to call if high fever, dehydration, marked weakness, fainting, increased abdominal pain, blood in stool or vomit (red or black).     Reviewed possible Covid symptoms including cough, shortness of breath with exertion or at rest, runny nose, congestion, sinus pain/pressure, sore throat, fever/chills, body aches, fatigue, loss of taste/smell, GI symptoms of nausea/vomiting/diarrhea. Also reviewed same day/emergent eval/ER precautions of dizziness/syncope, confusion, blue tint to lips/face, severe shortness of breath/difficulty breathing/wheezing.   Patient has sp02 monitor at home on her phone/watch and to notify clinic staff if sp02 less than 90 or pulse greater than 100.   Patient to isolate in own room and if possible use only one bathroom if living with others in home.  Wear mask when out of room to help prevent spread to others in household.  Sanitize high touch surfaces with lysol/chlorox/bleach spray or  wipes daily as viruses are known to live on surfaces from 24 hours to days.  Patient does want antivirals.  Patient at higher risk for hospitalization due to autoimmune disease, hypertension, diabetes, or immune suppression treatment/disease.  Recommend annual booster in October 60 days after infection resolution.  Patient is on prescription medications or daily medications. If taking medications epocrates interaction checker used to verify if any drug interactions.  Atorvastatin to be held today and until she has finished and off paxlovid x 24 hours.  Discussed may decrease efficacy of her metformin while on paxlovid also.  Patient paxlovid emergency use handout sent to patient electronically along with covid quarantine exitcare handout in my chart.  Discussed how to take paxlovid e.g. 3 pills 2 of nirmatrelvir 150mg  and 1 of ritonavir 100mg  am/pm x 5 days  Discussed lemonade can sometimes help with metallic/plastic taste in mouth (side effect medication).  Discussed most common side effects GI upset and bad taste in mouth.  Use birth control/avoid getting pregnant while on paxlovid and no breastfeeding.  Discussed I recommended not having sex with anyone while sick/testing positive/10 day quarantine as could spread virus to partner.  Discussed with patient she can contact me via my chart/email or phone while on quarantine  if questions/concerns.  Exitcare handouts on covid quarantine/home care sent to patient my chart.  FDA handout on paxlovid sent to patient.  Patient has dayquil/nyquil at home for cough/cold use and is working for her/him.  May use salt water gargles and nasal saline 2 sprays each nostril q2h prn congestion/sore throat.  Research has shown it helps to prevent hospitalizations and decrease  discomfort.   May use mometasone nasal 1 spray each nostril BID prn rhinitis.  Dayquil and nyquil per manufacturer instructions.  Patient has  used tessalon pearles  in the past and doesn't need at  this time.  Patient has proair inhaler at home for prn use protracted cough/wheezing/chest tightness.  Hasn't used yet but reports cough frequent.  .  Discussed albuterol use with patient 1-2 puffs po q4-6 hours prn.   Discussed honey 1 tablespoon every 4 hours is a natural cough suppressant but caution due to her diabetes.  Avoid dehydration and drink water to keep urine pale yellow clear and voiding every 2-4 hours while awake.  Patient alert and oriented x3, spoke full sentences without difficulty.  Some nasal congestion/cough/throat clearing audible during telephone call.  No wheezing/shortness of breath during 25 minute telephone call.  Discussed with patient can contact NP Inetta Fermo through my chart/[phone] when clinic closed if questions or concerns until RN Bess Kinds returns to clinic on Monday [phone].   Pt verbalized understanding and agreement with plan of care. No further questions/concerns at this time. Pt reminded to contact clinic with any changes in symptoms or questions/concerns. HR notified patient to work remote/quarantine through Day 5 RTW estimated 29 July with strict mask wear through Day 10 1 Aug and no eating in employee lunch room.  Estimated return to work 29 Jul.  HR team and Engineer, manufacturing and  Supervisor notified of excused absence.

## 2022-12-17 MED ORDER — MOLNUPIRAVIR EUA 200MG CAPSULE: 4.0000 | ORAL_CAPSULE | Freq: Two times a day (BID) | ORAL | 0 refills | Status: AC

## 2022-12-17 NOTE — Telephone Encounter (Signed)
[facility]  [facility]ed [facility] left message to contact me if paxlovid available.  Patient wants molnupiravir recommended by pharmacy electronic Rx sent to pharmacy of choice.

## 2022-12-18 NOTE — Telephone Encounter (Signed)
Patient has returned to work denied any concerns for clinic staff

## 2022-12-22 NOTE — Telephone Encounter (Signed)
Patient RTW today reports hanging in there. Educated to wear mask til 12/25/22 and let clinic know if worsening symptoms.

## 2022-12-22 NOTE — Progress Notes (Signed)
Patient read results note per epic read receipt 09/11/22; met with RN Kimrey 07/15/22 Hgba1c 5.9 LDL 87 BP 124/78 weight 126lbs met Be Well 2025 requirements RN Kimrey notified HR qualified for insurance discount FY 2025

## 2022-12-24 ENCOUNTER — Encounter (INDEPENDENT_AMBULATORY_CARE_PROVIDER_SITE_OTHER): Payer: Self-pay

## 2023-01-12 ENCOUNTER — Telehealth: Payer: Self-pay | Admitting: Family Medicine

## 2023-01-12 ENCOUNTER — Telehealth: Payer: Self-pay | Admitting: Registered Nurse

## 2023-01-12 ENCOUNTER — Encounter: Payer: Self-pay | Admitting: Registered Nurse

## 2023-01-12 DIAGNOSIS — E118 Type 2 diabetes mellitus with unspecified complications: Secondary | ICD-10-CM

## 2023-01-12 DIAGNOSIS — E785 Hyperlipidemia, unspecified: Secondary | ICD-10-CM

## 2023-01-12 DIAGNOSIS — R748 Abnormal levels of other serum enzymes: Secondary | ICD-10-CM

## 2023-01-12 DIAGNOSIS — I10 Essential (primary) hypertension: Secondary | ICD-10-CM

## 2023-01-12 NOTE — Telephone Encounter (Signed)
See prev message, pt has a 6 month f/u scheduled 01/22/23

## 2023-01-12 NOTE — Telephone Encounter (Signed)
I corresponded with NP at her work through [employer] and lipid and A1c  I asked if she needs orders put in epic for [employer] Thanks

## 2023-01-12 NOTE — Telephone Encounter (Signed)
Patient would like her lab order to be sent over to her employer to be drawn. They can be faxed over to [phone].

## 2023-01-12 NOTE — Telephone Encounter (Signed)
PCM replied patient needs Hgba1c, CMP and lipid panel

## 2023-01-12 NOTE — Telephone Encounter (Signed)
Patient requested appt for lab draw prior to [facility] appt end of month.  Epic reviewed no orders noted reviewed May office note from [facility] no specific labs noted.  Secure message sent to [facility] to verify orders.  Discussed with patient B12 elevated on Be Well appt Feb 2024 follow up level will be drawn with [facility] labs 26 Aug at 0815.  Patient agreed with plan of care and had no further questions at that time.  RN Chantel notified to schedule patient appt/completed in epic.

## 2023-01-13 NOTE — Telephone Encounter (Signed)
NP has already placed future orders for pt

## 2023-01-19 ENCOUNTER — Other Ambulatory Visit: Payer: Self-pay | Admitting: Registered Nurse

## 2023-01-19 ENCOUNTER — Other Ambulatory Visit: Payer: No Typology Code available for payment source

## 2023-01-19 VITALS — BP 130/72 | HR 78 | Ht 64.0 in | Wt 121.0 lb

## 2023-01-19 DIAGNOSIS — Z0000 Encounter for general adult medical examination without abnormal findings: Secondary | ICD-10-CM

## 2023-01-19 NOTE — Progress Notes (Signed)
Pt in today for Be Well. Labs drawn with one venipuncture w/o complications. Pt tolerated well.

## 2023-01-20 LAB — CMP12+LP+TP+TSH+6AC+CBC/D/PLT
ALT: 11 IU/L (ref 0–32)
AST: 18 IU/L (ref 0–40)
Albumin: 4.3 g/dL (ref 3.8–4.8)
Alkaline Phosphatase: 118 IU/L (ref 44–121)
BUN/Creatinine Ratio: 22 (ref 12–28)
BUN: 17 mg/dL (ref 8–27)
Basophils Absolute: 0 10*3/uL (ref 0.0–0.2)
Basos: 1 %
Bilirubin Total: 0.5 mg/dL (ref 0.0–1.2)
Calcium: 9.4 mg/dL (ref 8.7–10.3)
Chloride: 98 mmol/L (ref 96–106)
Chol/HDL Ratio: 3.2 ratio (ref 0.0–4.4)
Cholesterol, Total: 175 mg/dL (ref 100–199)
Creatinine, Ser: 0.79 mg/dL (ref 0.57–1.00)
EOS (ABSOLUTE): 0.2 10*3/uL (ref 0.0–0.4)
Eos: 2 %
Estimated CHD Risk: <0.5 times avg. (ref 0.0–1.0)
Free Thyroxine Index: 2 (ref 1.2–4.9)
GGT: 7 IU/L (ref 0–60)
Globulin, Total: 2.2 g/dL (ref 1.5–4.5)
Glucose: 85 mg/dL (ref 70–99)
HDL: 55 mg/dL (ref >39)
Hematocrit: 40.1 % (ref 34.0–46.6)
Hemoglobin: 13 g/dL (ref 11.1–15.9)
Immature Grans (Abs): 0 10*3/uL (ref 0.0–0.1)
Immature Granulocytes: 0 %
Iron: 103 ug/dL (ref 27–139)
LDH: 205 IU/L (ref 119–226)
LDL Chol Calc (NIH): 98 mg/dL (ref 0–99)
Lymphocytes Absolute: 1.1 10*3/uL (ref 0.7–3.1)
Lymphs: 15 %
MCH: 29.5 pg (ref 26.6–33.0)
MCHC: 32.4 g/dL (ref 31.5–35.7)
MCV: 91 fL (ref 79–97)
Monocytes Absolute: 0.4 10*3/uL (ref 0.1–0.9)
Monocytes: 6 %
Neutrophils Absolute: 5.8 10*3/uL (ref 1.4–7.0)
Neutrophils: 76 %
Phosphorus: 3.1 mg/dL (ref 3.0–4.3)
Platelets: 254 10*3/uL (ref 150–450)
Potassium: 3.9 mmol/L (ref 3.5–5.2)
RBC: 4.4 x10E6/uL (ref 3.77–5.28)
RDW: 12.7 % (ref 11.7–15.4)
Sodium: 138 mmol/L (ref 134–144)
T3 Uptake Ratio: 22 % — ABNORMAL LOW (ref 24–39)
T4, Total: 9.2 ug/dL (ref 4.5–12.0)
TSH: 3.06 u[IU]/mL (ref 0.450–4.500)
Total Protein: 6.5 g/dL (ref 6.0–8.5)
Triglycerides: 124 mg/dL (ref 0–149)
Uric Acid: 4.9 mg/dL (ref 3.1–7.9)
VLDL Cholesterol Cal: 22 mg/dL (ref 5–40)
WBC: 7.6 10*3/uL (ref 3.4–10.8)
eGFR: 78 mL/min/{1.73_m2} (ref >59)

## 2023-01-20 LAB — HGB A1C W/O EAG: Hgb A1c MFr Bld: 5.8 % — ABNORMAL HIGH (ref 4.8–5.6)

## 2023-01-20 NOTE — Progress Notes (Signed)
My chart message sent to patient  Laurie Tran,  See RN Reece Packer if you want printed copy of results.  Results sent electronically to [facility] Dr Milinda Antis.  Follow up with PCM elevated Hgba1c 5.8 (improved from previous and good control for Type II diabetes) and slightly low T3 uptake (thyroid hormone).   I recommend hydrate with water to keep urine pale yellow/clear and voiding every 2-4 hours when awake, exercise 150 minutes per week; dietary fiber daily by mouth 20 grams women per up to date; eat whole grains/fruits/vegetables; keep added sugars to less than 100 calories/ 5 teaspoons for women per American Heart Association; spot blood sugar, cholesterol, kidney/liver function, electrolytes, and complete blood count normal I recommend repeat Hgba1c in 6-12 months.   Please let us know if you have further questions.    Sincerely,  Albina Billet NP-C

## 2023-01-22 ENCOUNTER — Encounter: Payer: Self-pay | Admitting: Family Medicine

## 2023-01-22 ENCOUNTER — Ambulatory Visit: Payer: No Typology Code available for payment source | Admitting: Family Medicine

## 2023-01-22 VITALS — BP 136/70 | HR 79 | Temp 97.9°F | Ht 64.0 in | Wt 119.1 lb

## 2023-01-22 DIAGNOSIS — Z23 Encounter for immunization: Secondary | ICD-10-CM

## 2023-01-22 DIAGNOSIS — Z7984 Long term (current) use of oral hypoglycemic drugs: Secondary | ICD-10-CM

## 2023-01-22 DIAGNOSIS — E1169 Type 2 diabetes mellitus with other specified complication: Secondary | ICD-10-CM | POA: Diagnosis not present

## 2023-01-22 DIAGNOSIS — E785 Hyperlipidemia, unspecified: Secondary | ICD-10-CM | POA: Diagnosis not present

## 2023-01-22 DIAGNOSIS — I10 Essential (primary) hypertension: Secondary | ICD-10-CM

## 2023-01-22 DIAGNOSIS — E118 Type 2 diabetes mellitus with unspecified complications: Secondary | ICD-10-CM | POA: Diagnosis not present

## 2023-01-22 NOTE — Assessment & Plan Note (Signed)
Disc goals for lipids and reasons to control them Rev last labs with pt Rev low sat fat diet in detail Fair control  LDL of 98 with goal of 70 or below Will increase atorvastatin to 20 mg daily if not improved next time

## 2023-01-22 NOTE — Progress Notes (Signed)
Subjective:    Patient ID: Laurie Tran, female    DOB: November 08, 1945, 77 y.o.   MRN: [id number]  HPI  Wt Readings from Last 3 Encounters:  01/22/23 119 lb 2 oz (54 kg)  01/19/23 121 lb (54.9 kg)  11/07/22 114 lb 14.4 oz (52.1 kg)   20.45 kg/m  Vitals:   01/22/23 0757 01/22/23 0824  BP: (!) 146/82 136/70  Pulse: 79   Temp: 97.9 F (36.6 C)   SpO2: 96%      Pt presents for follow up of HTN and DM2 and chronic medical problems  Also flu shot   Had knee replacement  Then kidney stone  Then covid (pretty rough)  Stayed tired for quite a while   Has a colonoscopy planned also - takes a while to recoup from that    HTN bp is stable today  No cp or palpitations or headaches or edema  No side effects to medicines  BP Readings from Last 3 Encounters:  01/22/23 136/70  01/19/23 130/72  11/07/22 (!) 123/57    Hydrochlorothiazide 25 mg   No blood pressure problems outside the office   Lab Results  Component Value Date   NA 138 01/19/2023   K 3.9 01/19/2023   CO2 25 09/10/2022   GLUCOSE 85 01/19/2023   BUN 17 01/19/2023   CREATININE 0.79 01/19/2023   CALCIUM 9.4 01/19/2023   EGFR 78 01/19/2023   GFRNONAA 74 02/24/2020  GFR is improved to 78  Hyperlipidemia Lab Results  Component Value Date   CHOL 175 01/19/2023   CHOL 161 07/14/2022   CHOL 164 07/08/2021   Lab Results  Component Value Date   HDL 55 01/19/2023   HDL 57 07/14/2022   HDL 56 07/08/2021   Lab Results  Component Value Date   LDLCALC 98 01/19/2023   LDLCALC 87 07/14/2022   LDLCALC 86 07/08/2021   Lab Results  Component Value Date   TRIG 124 01/19/2023   TRIG 95 07/14/2022   TRIG 126 07/08/2021   Lab Results  Component Value Date   CHOLHDL 3.2 01/19/2023   CHOLHDL 2.8 07/14/2022   CHOLHDL 2.9 07/08/2021   No results found for: "LDLDIRECT" Pt declines statin in the past  Atorvastatin 10 mg daily    Had no appetite after knee surgery and during covid    DM2 Lab Results   Component Value Date   HGBA1C 5.8 (H) 01/19/2023  This is down from 6.0  Metformin 500 mg daily   Lab Results  Component Value Date   LABMICR <3.0 07/14/2022   LABMICR See below: 12/21/2020    Eye exam 03/2022 no retinopathy   Just started to get back to exercise  Had a hard time getting knee to flex enough  Bike, rowing machine     Other labs  Lab Results  Component Value Date   ALT 11 01/19/2023   AST 18 01/19/2023   GGT 7 01/19/2023   ALKPHOS 118 01/19/2023   BILITOT 0.5 01/19/2023   Lab Results  Component Value Date   WBC 7.6 01/19/2023   HGB 13.0 01/19/2023   HCT 40.1 01/19/2023   MCV 91 01/19/2023   PLT 254 01/19/2023    Thyriod function  Lab Results  Component Value Date   TSH 3.060 01/19/2023   FT4 normal at 9.2 T3 up take ratio low at 22   Lab Results  Component Value Date   VITAMINB12 1,272 (H) 07/14/2022   No longer takes B12  Takes preservision one pill daily  Supposed to take 2 but too expensive   D mannose for bladder  Takes probiotic         Patient Active Problem List   Diagnosis Date Noted   Elevated LDH 07/24/2022   Osteopenia 06/08/2022   Kidney stone 08/26/2020   Lichen sclerosus of female genitalia 06/25/2020   Cotton wool spots 12/26/2019   Eye exam abnormal 12/26/2019   Ureteral calculus 11/25/2018   Encounter for hepatitis C screening test for low risk patient 01/19/2017   Estrogen deficiency 01/19/2017   Lichen sclerosus 01/19/2017   H/O herpes zoster 11/13/2011   Routine general medical examination at a health care facility 05/30/2011   Other screening mammogram 05/30/2011   Colon polyps 05/30/2011   Type 2 diabetes mellitus with complication, without long-term current use of insulin (HCC) 10/29/2010   GERD (gastroesophageal reflux disease) 10/29/2010   HTN (hypertension) 10/29/2010   Hyperlipidemia associated with type 2 diabetes mellitus (HCC) 10/29/2010   History of kidney stones 10/29/2010    Perennial allergic rhinitis 10/29/2010   Past Medical History:  Diagnosis Date   Allergic rhinitis    Allergy 1967   Cataract 2020   Removed   Frequency of urination    GERD (gastroesophageal reflux disease)    Hematuria    History of echocardiogram    in epic 12-22-2006  mild AV sclerosis without stenosis,  normal LVF, ef 55-60%,  trivial TR   History of gastroesophageal reflux (GERD)    per pt no issues since stopped drinking soda's   History of kidney stones    History of nuclear stress test    in epic 09-25-2005 normal with no ischemia, ef 69%   Hypertension    Nephrolithiasis    bilateral non-obstructive per CT 11-25-2018   Nocturia    OA (osteoarthritis)    Right ureteral stone    Type 2 diabetes mellitus (HCC)    followed by pcp   Urgency of urination    Wears glasses    Past Surgical History:  Procedure Laterality Date   BREAST SURGERY  1985   breast biopsy benign   CYSTOSCOPY W/ URETERAL STENT PLACEMENT Right 11/25/2018   Procedure: CYSTOSCOPY WITH RETROGRADE PYELOGRAM/URETERAL STENT PLACEMENT;  Surgeon: Malen Gauze, MD;  Location: [facility];  Service: Urology;  Laterality: Right;   CYSTOSCOPY WITH RETROGRADE PYELOGRAM, URETEROSCOPY AND STENT PLACEMENT Left 10/21/2016   Procedure: CYSTOSCOPY WITH RETROGRADE PYELOGRAM, LEFT URETEROSCOPY PYELOSCOPY  AND LEFT STENT PLACEMENT;  Surgeon: Jethro Bolus, MD;  Location: [facility];  Service: Urology;  Laterality: Left;   CYSTOSCOPY WITH RETROGRADE PYELOGRAM, URETEROSCOPY AND STENT PLACEMENT Right 12/20/2018   Procedure: CYSTOSCOPY WITH RIGHT RETROGRADE URETEROSCOPY WITH LASER  AND STENT REPLACEMENT;  Surgeon: Malen Gauze, MD;  Location: [facility];  Service: Urology;  Laterality: Right;   CYSTOSCOPY/RETROGRADE/URETEROSCOPY/STONE EXTRACTION WITH BASKET  yrs ago   CYSTOSCOPY/URETEROSCOPY/HOLMIUM LASER/STENT PLACEMENT Right 09/25/2021   Procedure: CYSTOSCOPY/URETEROSCOPY/ RETROGRADE/HOLMIUM  LASER/STENT PLACEMENT;  Surgeon: Rene Paci, MD;  Location: [facility];  Service: Urology;  Laterality: Right;   EXTRACORPOREAL SHOCK WAVE LITHOTRIPSY  01-06-2011   @[facility]    EXTRACORPOREAL SHOCK WAVE LITHOTRIPSY Right 08/11/2019   Procedure: EXTRACORPOREAL SHOCK WAVE LITHOTRIPSY (ESWL);  Surgeon: Sebastian Ache, MD;  Location: [facility];  Service: Urology;  Laterality: Right;   EXTRACORPOREAL SHOCK WAVE LITHOTRIPSY Left 03/01/2020   Procedure: LEFT EXTRACORPOREAL SHOCK WAVE LITHOTRIPSY (ESWL);  Surgeon: Noel Christmas, MD;  Location: [facility];  Service: Urology;  Laterality: Left;   EXTRACORPOREAL SHOCK WAVE LITHOTRIPSY Right 11/07/2022   Procedure: RIGHT EXTRACORPOREAL SHOCK WAVE LITHOTRIPSY (ESWL);  Surgeon: Alfredo Martinez, MD;  Location: [facility];  Service: Urology;  Laterality: Right;  75 MINUTES   HOLMIUM LASER APPLICATION Right 12/20/2018   Procedure: HOLMIUM LASER APPLICATION;  Surgeon: Malen Gauze, MD;  Location: [facility];  Service: Urology;  Laterality: Right;   KNEE ARTHROSCOPY Right 2006   Social History   Tobacco Use   Smoking status: Never    Passive exposure: Past   Smokeless tobacco: Never  Vaping Use   Vaping status: Never Used  Substance Use Topics   Alcohol use: No   Drug use: No   Family History  Problem Relation Age of Onset   Cancer Mother        breast CA   Arthritis Paternal Grandmother    Stroke Paternal Grandmother    Diabetes Paternal Grandmother    Asthma Paternal Grandmother    Cancer Sister    Breast cancer Sister    Colon cancer Brother    Cancer Brother    Arthritis Maternal Grandmother    Allergies  Allergen Reactions   Bactrim [Sulfamethoxazole-Trimethoprim] Anaphylaxis and Shortness Of Breath    Pt reported tongue swelling with open sores, ShOB, difficulty swallowing   Methocarbamol Hives and Anaphylaxis     Bronchitis/congestion/coughing   Nsaids Anaphylaxis, Hives and Shortness Of Breath    Breathing problems ;  Per pt "all anti-flammatory's "   Sulfa Antibiotics Anaphylaxis and Shortness Of Breath    Pt reports tongue swelling, ShOB, difficulty swallowing   Celebrex [Celecoxib] Hives and Other (See Comments)    Congestion   Mixed Grasses Other (See Comments)   Molds & Smuts Other (See Comments)   Myrbetriq Theodosia Paling Er] Other (See Comments)    Severe myalgias   Other Other (See Comments)    "needs to clear throat often with peanuts"   Tree Extract Other (See Comments)   Vioxx [Rofecoxib] Hives and Other (See Comments)    Congestion   Current Outpatient Medications on File Prior to Visit  Medication Sig Dispense Refill   Albuterol Sulfate (PROAIR RESPICLICK) 108 (90 Base) MCG/ACT AEPB Inhale 1-2 puffs into the lungs every 4 (four) hours as needed. 1 each 0   atorvastatin (LIPITOR) 10 MG tablet TAKE 1 TABLET BY MOUTH EVERY DAY 90 tablet 2   Cholecalciferol 25 MCG (1000 UT) capsule Take 1,000 Units by mouth daily.     Cranberry-Vitamin C-Probiotic (AZO CRANBERRY PO) Take 1 capsule by mouth daily.     D-MANNOSE PO Take by mouth.     diphenhydrAMINE (BENADRYL) 25 MG tablet Take 25 mg by mouth at bedtime.     DM-Phenylephrine-Acetaminophen (VICKS DAYQUIL COLD & FLU) 10-5-325 MG CAPS Take 1 capsule by mouth 2 (two) times daily as needed (cough runny nose fever).     EPINEPHrine 0.3 mg/0.3 mL IJ SOAJ injection Inject 0.3 mg into the muscle as needed for anaphylaxis.     estradiol (ESTRACE) 0.1 MG/GM vaginal cream As directed by urology     fexofenadine (ALLEGRA) 180 MG tablet TAKE 1 TABLET (180 MG TOTAL) BY MOUTH DAILY. (Patient taking differently: Take 180 mg by mouth daily.) 90 tablet 3   GLUCOSAMINE CHONDROITIN COMPLX PO Take by mouth.     hydrochlorothiazide (HYDRODIURIL) 25 MG tablet TAKE 1 TABLET (25 MG TOTAL) BY MOUTH DAILY. 90 tablet 2   Lactobacillus (PROBIOTIC ACIDOPHILUS  PO) Take  by mouth.     metFORMIN (GLUCOPHAGE) 500 MG tablet TAKE 1 TABLET BY MOUTH EVERY DAY WITH BREAKFAST 90 tablet 2   mometasone (NASONEX) 50 MCG/ACT nasal spray Place 2 sprays into the nose daily as needed. 17 g 11   Multiple Vitamins-Minerals (PRESERVISION AREDS PO) Take by mouth.     potassium chloride (KLOR-CON) 10 MEQ tablet TAKE 1 TABLET BY MOUTH EVERY DAY 90 tablet 2   triamcinolone cream (KENALOG) 0.1 % Apply 1 application topically 2 (two) times daily as needed.     No current facility-administered medications on file prior to visit.    Review of Systems  Constitutional:  Negative for activity change, appetite change, fatigue, fever and unexpected weight change.  HENT:  Negative for congestion, ear pain, rhinorrhea, sinus pressure and sore throat.   Eyes:  Negative for pain, redness and visual disturbance.  Respiratory:  Negative for cough, shortness of breath and wheezing.   Cardiovascular:  Negative for chest pain and palpitations.  Gastrointestinal:  Negative for abdominal pain, blood in stool, constipation and diarrhea.  Endocrine: Negative for polydipsia and polyuria.  Genitourinary:  Negative for dysuria, frequency and urgency.  Musculoskeletal:  Negative for arthralgias, back pain and myalgias.  Skin:  Negative for pallor and rash.  Allergic/Immunologic: Negative for environmental allergies.  Neurological:  Negative for dizziness, syncope and headaches.  Hematological:  Negative for adenopathy. Does not bruise/bleed easily.  Psychiatric/Behavioral:  Negative for decreased concentration and dysphoric mood. The patient is not nervous/anxious.        Objective:   Physical Exam Constitutional:      General: She is not in acute distress.    Appearance: Normal appearance. She is well-developed and normal weight. She is not ill-appearing or diaphoretic.  HENT:     Head: Normocephalic and atraumatic.  Eyes:     Conjunctiva/sclera: Conjunctivae normal.     Pupils: Pupils are  equal, round, and reactive to light.  Neck:     Thyroid: No thyromegaly.     Vascular: No carotid bruit or JVD.  Cardiovascular:     Rate and Rhythm: Normal rate and regular rhythm.     Heart sounds: Normal heart sounds.     No gallop.  Pulmonary:     Effort: Pulmonary effort is normal. No respiratory distress.     Breath sounds: Normal breath sounds. No wheezing or rales.  Abdominal:     General: There is no distension or abdominal bruit.     Palpations: Abdomen is soft.  Musculoskeletal:     Cervical back: Normal range of motion and neck supple.     Right lower leg: No edema.     Left lower leg: No edema.  Lymphadenopathy:     Cervical: No cervical adenopathy.  Skin:    General: Skin is warm and dry.     Coloration: Skin is not pale.     Findings: No rash.  Neurological:     Mental Status: She is alert.     Coordination: Coordination normal.     Deep Tendon Reflexes: Reflexes are normal and symmetric. Reflexes normal.  Psychiatric:        Mood and Affect: Mood normal.           Assessment & Plan:   Problem List Items Addressed This Visit       Cardiovascular and Mediastinum   HTN (hypertension)    bp in fair control at this time  BP Readings from Last 1 Encounters:  01/22/23 136/70   No changes needed Most recent labs reviewed  Disc lifstyle change with low sodium diet and exercise  Plan to continue hctz 25 mg daily with K GFR is 78 improved  Watching this (takes both hctz and metformin)  Enc good fluid intake        Endocrine   Hyperlipidemia associated with type 2 diabetes mellitus (HCC)    Disc goals for lipids and reasons to control them Rev last labs with pt Rev low sat fat diet in detail Fair control  LDL of 98 with goal of 70 or below Will increase atorvastatin to 20 mg daily if not improved next time         Type 2 diabetes mellitus with complication, without long-term current use of insulin (HCC) - Primary    Lab Results  Component  Value Date   HGBA1C 5.8 (H) 01/19/2023   Very well controlled Metformin 500 mg daily  Microalb utd  On statin  utd eye exam/ due in nov       Other Visit Diagnoses     Need for influenza vaccination       Relevant Orders   Flu Vaccine Trivalent High Dose (Fluad) (Completed)

## 2023-01-22 NOTE — Assessment & Plan Note (Signed)
bp in fair control at this time  BP Readings from Last 1 Encounters:  01/22/23 136/70   No changes needed Most recent labs reviewed  Disc lifstyle change with low sodium diet and exercise  Plan to continue hctz 25 mg daily with K GFR is 78 improved  Watching this (takes both hctz and metformin)  Enc good fluid intake

## 2023-01-22 NOTE — Patient Instructions (Addendum)
Take care of yourself   Keep up a good fluid intake   Watch your blood pressure at home let us know if it stays over 135/ 85   Get back into exercise gradually   Follow up in 6 months   Get labs prior at work

## 2023-01-22 NOTE — Assessment & Plan Note (Signed)
Lab Results  Component Value Date   HGBA1C 5.8 (H) 01/19/2023   Very well controlled Metformin 500 mg daily  Microalb utd  On statin  utd eye exam/ due in nov

## 2023-02-05 ENCOUNTER — Encounter: Payer: Self-pay | Admitting: Registered Nurse

## 2023-02-05 ENCOUNTER — Ambulatory Visit: Payer: No Typology Code available for payment source | Admitting: Registered Nurse

## 2023-02-05 VITALS — BP 149/72 | HR 87 | Temp 98.2°F | Resp 16

## 2023-02-05 DIAGNOSIS — I10 Essential (primary) hypertension: Secondary | ICD-10-CM

## 2023-02-05 NOTE — Progress Notes (Signed)
Subjective:    Patient ID: Laurie Tran, female    DOB: 1945-11-10, 77 y.o.   MRN: [id number]  77y/o caucasian married established female here for BP check per PCM.  Denied headache/pain/chest pain.  Reported saw RN Burna Mortimer Monday 02/02/23 and BP 140/70.      Review of Systems  Constitutional:  Negative for chills and fever.  HENT:  Negative for trouble swallowing and voice change.   Eyes:  Negative for photophobia and visual disturbance.  Respiratory:  Negative for chest tightness.   Cardiovascular:  Negative for chest pain.  Gastrointestinal:  Negative for diarrhea and vomiting.  Musculoskeletal:  Negative for gait problem.  Neurological:  Negative for headaches.       Objective:   Physical Exam Vitals reviewed.  Constitutional:      General: She is awake. She is not in acute distress.    Appearance: Normal appearance. She is well-developed, well-groomed and normal weight. She is not ill-appearing, toxic-appearing or diaphoretic.  HENT:     Head: Normocephalic and atraumatic.     Jaw: There is normal jaw occlusion.     Salivary Glands: Right salivary gland is not diffusely enlarged. Left salivary gland is not diffusely enlarged.     Right Ear: Hearing and external ear normal.     Left Ear: Hearing and external ear normal.     Nose: Nose normal. No congestion or rhinorrhea.     Mouth/Throat:     Lips: Pink. No lesions.     Mouth: Mucous membranes are moist.     Pharynx: Oropharynx is clear.  Eyes:     General: Lids are normal. Vision grossly intact. Gaze aligned appropriately. No scleral icterus.       Right eye: No discharge.        Left eye: No discharge.     Extraocular Movements: Extraocular movements intact.     Conjunctiva/sclera: Conjunctivae normal.     Pupils: Pupils are equal, round, and reactive to light.  Neck:     Trachea: Trachea normal.  Cardiovascular:     Rate and Rhythm: Normal rate and regular rhythm.     Pulses: Normal pulses.  Pulmonary:      Effort: Pulmonary effort is normal.     Breath sounds: Normal breath sounds and air entry. No stridor or transmitted upper airway sounds. No wheezing.     Comments: Spoke full sentences without difficulty; no cough observed in exam room Abdominal:     Palpations: Abdomen is soft.  Musculoskeletal:        General: Normal range of motion.     Cervical back: Normal range of motion and neck supple. No rigidity.     Right lower leg: No edema.     Left lower leg: No edema.  Lymphadenopathy:     Head:     Right side of head: No submandibular or preauricular adenopathy.     Left side of head: No submandibular or preauricular adenopathy.     Cervical: No cervical adenopathy.     Right cervical: No superficial cervical adenopathy.    Left cervical: No superficial cervical adenopathy.  Skin:    General: Skin is warm and dry.     Capillary Refill: Capillary refill takes less than 2 seconds.     Coloration: Skin is not ashen, cyanotic, jaundiced, mottled, pale or sallow.     Findings: No abrasion, bruising, burn, erythema, signs of injury, laceration, lesion, petechiae, rash or wound.  Neurological:  General: No focal deficit present.     Mental Status: She is alert and oriented to person, place, and time. Mental status is at baseline.     Cranial Nerves: No cranial nerve deficit.     Motor: Motor function is intact. No weakness, tremor, abnormal muscle tone or seizure activity.     Coordination: Coordination is intact. Coordination normal.     Gait: Gait is intact. Gait normal.     Comments: In/out of chair without difficulty; gait sure and steady in clinic; bilateral hand grasp equal 5/5  Psychiatric:        Attention and Perception: Attention and perception normal.        Mood and Affect: Mood and affect normal.        Speech: Speech normal.        Behavior: Behavior normal. Behavior is cooperative.        Thought Content: Thought content normal.        Cognition and Memory: Cognition  and memory normal.        Judgment: Judgment normal.           Assessment & Plan:  A-essential hypertension  P-patient reported some stress at work today but denied pain or high salt added diet.  Plans to see RN Burna Mortimer 02/09/23 for next BP check. Refused BP recheck after sitting another 5 minutes. Taking medication as prescribed denied other questions or concerns.  Exitcare handouts on dash diet and managing hypertension.  Patient verbalized understanding information/instructions and had no further questions at this time.

## 2023-02-05 NOTE — Patient Instructions (Signed)
Managing Your Hypertension Hypertension, also called high blood pressure, is when the force of the blood pressing against the walls of the arteries is too strong. Arteries are blood vessels that carry blood from your heart throughout your body. Hypertension forces the heart to work harder to pump blood and may cause the arteries to become narrow or stiff. Understanding blood pressure readings A blood pressure reading includes a higher number over a lower number: The first, or top, number is called the systolic pressure. It is a measure of the pressure in your arteries as your heart beats. The second, or bottom number, is called the diastolic pressure. It is a measure of the pressure in your arteries as the heart relaxes. For most people, a normal blood pressure is below 120/80. Your personal target blood pressure may vary depending on your medical conditions, your age, and other factors. Blood pressure is classified into four stages. Based on your blood pressure reading, your health care provider may use the following stages to determine what type of treatment you need, if any. Systolic pressure and diastolic pressure are measured in a unit called millimeters of mercury (mmHg). Normal Systolic pressure: below 120. Diastolic pressure: below 80. Elevated Systolic pressure: 120-129. Diastolic pressure: below 80. Hypertension stage 1 Systolic pressure: 130-139. Diastolic pressure: 80-89. Hypertension stage 2 Systolic pressure: 140 or above. Diastolic pressure: 90 or above. How can this condition affect me? Managing your hypertension is very important. Over time, hypertension can damage the arteries and decrease blood flow to parts of the body, including the brain, heart, and kidneys. Having untreated or uncontrolled hypertension can lead to: A heart attack. A stroke. A weakened blood vessel (aneurysm). Heart failure. Kidney damage. Eye damage. Memory and concentration problems. Vascular  dementia. What actions can I take to manage this condition? Hypertension can be managed by making lifestyle changes and possibly by taking medicines. Your health care provider will help you make a plan to bring your blood pressure within a normal range. You may be referred for counseling on a healthy diet and physical activity. Nutrition  Eat a diet that is high in fiber and potassium, and low in salt (sodium), added sugar, and fat. An example eating plan is called the DASH diet. DASH stands for Dietary Approaches to Stop Hypertension. To eat this way: Eat plenty of fresh fruits and vegetables. Try to fill one-half of your plate at each meal with fruits and vegetables. Eat whole grains, such as whole-wheat pasta, brown rice, or whole-grain bread. Fill about one-fourth of your plate with whole grains. Eat low-fat dairy products. Avoid fatty cuts of meat, processed or cured meats, and poultry with skin. Fill about one-fourth of your plate with lean proteins such as fish, chicken without skin, beans, eggs, and tofu. Avoid pre-made and processed foods. These tend to be higher in sodium, added sugar, and fat. Reduce your daily sodium intake. Many people with hypertension should eat less than 1,500 mg of sodium a day. Lifestyle  Work with your health care provider to maintain a healthy body weight or to lose weight. Ask what an ideal weight is for you. Get at least 30 minutes of exercise that causes your heart to beat faster (aerobic exercise) most days of the week. Activities may include walking, swimming, or biking. Include exercise to strengthen your muscles (resistance exercise), such as weight lifting, as part of your weekly exercise routine. Try to do these types of exercises for 30 minutes at least 3 days a week. Do  not use any products that contain nicotine or tobacco. These products include cigarettes, chewing tobacco, and vaping devices, such as e-cigarettes. If you need help quitting, ask your  health care provider. Control any long-term (chronic) conditions you have, such as high cholesterol or diabetes. Identify your sources of stress and find ways to manage stress. This may include meditation, deep breathing, or making time for fun activities. Alcohol use Do not drink alcohol if: Your health care provider tells you not to drink. You are pregnant, may be pregnant, or are planning to become pregnant. If you drink alcohol: Limit how much you have to: 0-1 drink a day for women. 0-2 drinks a day for men. Know how much alcohol is in your drink. In the [place] one drink equals one 12 oz bottle of beer (355 mL), one 5 oz glass of wine (148 mL), or one 1 oz glass of hard liquor (44 mL). Medicines Your health care provider may prescribe medicine if lifestyle changes are not enough to get your blood pressure under control and if: Your systolic blood pressure is 130 or higher. Your diastolic blood pressure is 80 or higher. Take medicines only as told by your health care provider. Follow the directions carefully. Blood pressure medicines must be taken as told by your health care provider. The medicine does not work as well when you skip doses. Skipping doses also puts you at risk for problems. Monitoring Before you monitor your blood pressure: Do not smoke, drink caffeinated beverages, or exercise within 30 minutes before taking a measurement. Use the bathroom and empty your bladder (urinate). Sit quietly for at least 5 minutes before taking measurements. Monitor your blood pressure at home as told by your health care provider. To do this: Sit with your back straight and supported. Place your feet flat on the floor. Do not cross your legs. Support your arm on a flat surface, such as a table. Make sure your upper arm is at heart level. Each time you measure, take two or three readings one minute apart and record the results. You may also need to have your blood pressure checked regularly by  your health care provider. General information Talk with your health care provider about your diet, exercise habits, and other lifestyle factors that may be contributing to hypertension. Review all the medicines you take with your health care provider because there may be side effects or interactions. Keep all follow-up visits. Your health care provider can help you create and adjust your plan for managing your high blood pressure. Where to find more information National Heart, Lung, and Blood Institute: PopSteam.is American Heart Association: www.heart.org Contact a health care provider if: You think you are having a reaction to medicines you have taken. You have repeated (recurrent) headaches. You feel dizzy. You have swelling in your ankles. You have trouble with your vision. Get help right away if: You develop a severe headache or confusion. You have unusual weakness or numbness, or you feel faint. You have severe pain in your chest or abdomen. You vomit repeatedly. You have trouble breathing. These symptoms may be an emergency. Get help right away. Call 911. Do not wait to see if the symptoms will go away. Do not drive yourself to the hospital. Summary Hypertension is when the force of blood pumping through your arteries is too strong. If this condition is not controlled, it may put you at risk for serious complications. Your personal target blood pressure may vary depending on your medical conditions,  your age, and other factors. For most people, a normal blood pressure is less than 120/80. Hypertension is managed by lifestyle changes, medicines, or both. Lifestyle changes to help manage hypertension include losing weight, eating a healthy, low-sodium diet, exercising more, stopping smoking, and limiting alcohol. This information is not intended to replace advice given to you by your health care provider. Make sure you discuss any questions you have with your health care  provider. Document Revised: 01/24/2021 Document Reviewed: 01/24/2021 Elsevier Patient Education  2024 [employer]DASH Eating Plan DASH stands for Dietary Approaches to Stop Hypertension. The DASH eating plan is a healthy eating plan that has been shown to: Lower high blood pressure (hypertension). Reduce your risk for type 2 diabetes, heart disease, and stroke. Help with weight loss. What are tips for following this plan? Reading food labels Check food labels for the amount of salt (sodium) per serving. Choose foods with less than 5 percent of the Daily Value (DV) of sodium. In general, foods with less than 300 milligrams (mg) of sodium per serving fit into this eating plan. To find whole grains, look for the word "whole" as the first word in the ingredient list. Shopping Buy products labeled as "low-sodium" or "no salt added." Buy fresh foods. Avoid canned foods and pre-made or frozen meals. Cooking Try not to add salt when you cook. Use salt-free seasonings or herbs instead of table salt or sea salt. Check with your health care provider or pharmacist before using salt substitutes. Do not fry foods. Cook foods in healthy ways, such as baking, boiling, grilling, roasting, or broiling. Cook using oils that are good for your heart. These include olive, canola, avocado, soybean, and sunflower oil. Meal planning  Eat a balanced diet. This should include: 4 or more servings of fruits and 4 or more servings of vegetables each day. Try to fill half of your plate with fruits and vegetables. 6-8 servings of whole grains each day. 6 or less servings of lean meat, poultry, or fish each day. 1 oz is 1 serving. A 3 oz (85 g) serving of meat is about the same size as the palm of your hand. One egg is 1 oz (28 g). 2-3 servings of low-fat dairy each day. One serving is 1 cup (237 mL). 1 serving of nuts, seeds, or beans 5 times each week. 2-3 servings of heart-healthy fats. Healthy fats called omega-3  fatty acids are found in foods such as walnuts, flaxseeds, fortified milks, and eggs. These fats are also found in cold-water fish, such as sardines, salmon, and mackerel. Limit how much you eat of: Canned or prepackaged foods. Food that is high in trans fat, such as fried foods. Food that is high in saturated fat, such as fatty meat. Desserts and other sweets, sugary drinks, and other foods with added sugar. Full-fat dairy products. Do not salt foods before eating. Do not eat more than 4 egg yolks a week. Try to eat at least 2 vegetarian meals a week. Eat more home-cooked food and less restaurant, buffet, and fast food. Lifestyle When eating at a restaurant, ask if your food can be made with less salt or no salt. If you drink alcohol: Limit how much you have to: 0-1 drink a day if you are female. 0-2 drinks a day if you are female. Know how much alcohol is in your drink. In the [place] one drink is one 12 oz bottle of beer (355 mL), one 5 oz glass of wine (148 mL),  or one 1 oz glass of hard liquor (44 mL). General information Avoid eating more than 2,300 mg of salt a day. If you have hypertension, you may need to reduce your sodium intake to 1,500 mg a day. Work with your provider to stay at a healthy body weight or lose weight. Ask what the best weight range is for you. On most days of the week, get at least 30 minutes of exercise that causes your heart to beat faster. This may include walking, swimming, or biking. Work with your provider or dietitian to adjust your eating plan to meet your specific calorie needs. What foods should I eat? Fruits All fresh, dried, or frozen fruit. Canned fruits that are in their natural juice and do not have sugar added to them. Vegetables Fresh or frozen vegetables that are raw, steamed, roasted, or grilled. Low-sodium or reduced-sodium tomato and vegetable juice. Low-sodium or reduced-sodium tomato sauce and tomato paste. Low-sodium or reduced-sodium  canned vegetables. Grains Whole-grain or whole-wheat bread. Whole-grain or whole-wheat pasta. Brown rice. Orpah Cobb. Bulgur. Whole-grain and low-sodium cereals. Pita bread. Low-fat, low-sodium crackers. Whole-wheat flour tortillas. Meats and other proteins Skinless chicken or [place]. Ground chicken or [place]. Pork with fat trimmed off. Fish and seafood. Egg whites. Dried beans, peas, or lentils. Unsalted nuts, nut butters, and seeds. Unsalted canned beans. Lean cuts of beef with fat trimmed off. Low-sodium, lean precooked or cured meat, such as sausages or meat loaves. Dairy Low-fat (1%) or fat-free (skim) milk. Reduced-fat, low-fat, or fat-free cheeses. Nonfat, low-sodium ricotta or cottage cheese. Low-fat or nonfat yogurt. Low-fat, low-sodium cheese. Fats and oils Soft margarine without trans fats. Vegetable oil. Reduced-fat, low-fat, or light mayonnaise and salad dressings (reduced-sodium). Canola, safflower, olive, avocado, soybean, and sunflower oils. Avocado. Seasonings and condiments Herbs. Spices. Seasoning mixes without salt. Other foods Unsalted popcorn and pretzels. Fat-free sweets. The items listed above may not be all the foods and drinks you can have. Talk to a dietitian to learn more. What foods should I avoid? Fruits Canned fruit in a light or heavy syrup. Fried fruit. Fruit in cream or butter sauce. Vegetables Creamed or fried vegetables. Vegetables in a cheese sauce. Regular canned vegetables that are not marked as low-sodium or reduced-sodium. Regular canned tomato sauce and paste that are not marked as low-sodium or reduced-sodium. Regular tomato and vegetable juices that are not marked as low-sodium or reduced-sodium. Rosita Fire. Olives. Grains Baked goods made with fat, such as croissants, muffins, or some breads. Dry pasta or rice meal packs. Meats and other proteins Fatty cuts of meat. Ribs. Fried meat. Tomasa Blase. Bologna, salami, and other precooked or cured meats, such  as sausages or meat loaves, that are not lean and low in sodium. Fat from the back of a pig (fatback). Bratwurst. Salted nuts and seeds. Canned beans with added salt. Canned or smoked fish. Whole eggs or egg yolks. Chicken or [place] with skin. Dairy Whole or 2% milk, cream, and half-and-half. Whole or full-fat cream cheese. Whole-fat or sweetened yogurt. Full-fat cheese. Nondairy creamers. Whipped toppings. Processed cheese and cheese spreads. Fats and oils Butter. Stick margarine. Lard. Shortening. Ghee. Bacon fat. Tropical oils, such as coconut, palm kernel, or palm oil. Seasonings and condiments Onion salt, garlic salt, seasoned salt, table salt, and sea salt. Worcestershire sauce. Tartar sauce. Barbecue sauce. Teriyaki sauce. Soy sauce, including reduced-sodium soy sauce. Steak sauce. Canned and packaged gravies. Fish sauce. Oyster sauce. Cocktail sauce. Store-bought horseradish. Ketchup. Mustard. Meat flavorings and tenderizers. Bouillon cubes. Hot sauces.  Pre-made or packaged marinades. Pre-made or packaged taco seasonings. Relishes. Regular salad dressings. Other foods Salted popcorn and pretzels. The items listed above may not be all the foods and drinks you should avoid. Talk to a dietitian to learn more. Where to find more information National Heart, Lung, and Blood Institute (NHLBI): BuffaloDryCleaner.gl American Heart Association (AHA): heart.org Academy of Nutrition and Dietetics: eatright.org National Kidney Foundation (NKF): kidney.org This information is not intended to replace advice given to you by your health care provider. Make sure you discuss any questions you have with your health care provider. Document Revised: 05/29/2022 Document Reviewed: 05/29/2022 Elsevier Patient Education  2024 [employer].

## 2023-02-06 NOTE — Telephone Encounter (Signed)
Labs drawn 01/19/23 see results note

## 2023-02-06 NOTE — Telephone Encounter (Signed)
Patient had appt 01/22/23 with [facility] who reviewed lab results see office note

## 2023-02-12 LAB — HM COLONOSCOPY

## 2023-02-17 ENCOUNTER — Ambulatory Visit: Payer: No Typology Code available for payment source

## 2023-03-10 ENCOUNTER — Other Ambulatory Visit: Payer: Self-pay | Admitting: Registered Nurse

## 2023-03-10 DIAGNOSIS — E118 Type 2 diabetes mellitus with unspecified complications: Secondary | ICD-10-CM

## 2023-04-02 LAB — HM DIABETES EYE EXAM

## 2023-05-08 ENCOUNTER — Other Ambulatory Visit: Payer: Self-pay | Admitting: Family Medicine

## 2023-07-28 ENCOUNTER — Ambulatory Visit (INDEPENDENT_AMBULATORY_CARE_PROVIDER_SITE_OTHER): Payer: Self-pay | Admitting: Family Medicine

## 2023-07-28 ENCOUNTER — Encounter: Payer: Self-pay | Admitting: Family Medicine

## 2023-07-28 VITALS — BP 132/84 | HR 68 | Temp 98.0°F | Ht 63.75 in | Wt 118.0 lb

## 2023-07-28 DIAGNOSIS — M8580 Other specified disorders of bone density and structure, unspecified site: Secondary | ICD-10-CM

## 2023-07-28 DIAGNOSIS — E785 Hyperlipidemia, unspecified: Secondary | ICD-10-CM

## 2023-07-28 DIAGNOSIS — I10 Essential (primary) hypertension: Secondary | ICD-10-CM

## 2023-07-28 DIAGNOSIS — Z7984 Long term (current) use of oral hypoglycemic drugs: Secondary | ICD-10-CM

## 2023-07-28 DIAGNOSIS — E1169 Type 2 diabetes mellitus with other specified complication: Secondary | ICD-10-CM | POA: Diagnosis not present

## 2023-07-28 DIAGNOSIS — N904 Leukoplakia of vulva: Secondary | ICD-10-CM

## 2023-07-28 DIAGNOSIS — E118 Type 2 diabetes mellitus with unspecified complications: Secondary | ICD-10-CM

## 2023-07-28 DIAGNOSIS — J3089 Other allergic rhinitis: Secondary | ICD-10-CM

## 2023-07-28 DIAGNOSIS — Z1231 Encounter for screening mammogram for malignant neoplasm of breast: Secondary | ICD-10-CM

## 2023-07-28 DIAGNOSIS — D126 Benign neoplasm of colon, unspecified: Secondary | ICD-10-CM

## 2023-07-28 LAB — COMPREHENSIVE METABOLIC PANEL
ALT: 12 U/L (ref 0–35)
AST: 16 U/L (ref 0–37)
Albumin: 4.5 g/dL (ref 3.5–5.2)
Alkaline Phosphatase: 92 U/L (ref 39–117)
BUN: 16 mg/dL (ref 6–23)
CO2: 31 meq/L (ref 19–32)
Calcium: 9.8 mg/dL (ref 8.4–10.5)
Chloride: 101 meq/L (ref 96–112)
Creatinine, Ser: 0.75 mg/dL (ref 0.40–1.20)
GFR: 77.79 mL/min (ref >60.00)
Glucose, Bld: 100 mg/dL — ABNORMAL HIGH (ref 70–99)
Potassium: 3.7 meq/L (ref 3.5–5.1)
Sodium: 139 meq/L (ref 135–145)
Total Bilirubin: 0.7 mg/dL (ref 0.2–1.2)
Total Protein: 6.8 g/dL (ref 6.0–8.3)

## 2023-07-28 LAB — CBC WITH DIFFERENTIAL/PLATELET
Basophils Absolute: 0 10*3/uL (ref 0.0–0.1)
Basophils Relative: 0.6 % (ref 0.0–3.0)
Eosinophils Absolute: 0.3 10*3/uL (ref 0.0–0.7)
Eosinophils Relative: 4.1 % (ref 0.0–5.0)
HCT: 41.4 % (ref 36.0–46.0)
Hemoglobin: 13.9 g/dL (ref 12.0–15.0)
Lymphocytes Relative: 14.2 % (ref 12.0–46.0)
Lymphs Abs: 1.1 10*3/uL (ref 0.7–4.0)
MCHC: 33.6 g/dL (ref 30.0–36.0)
MCV: 86.8 fl (ref 78.0–100.0)
Monocytes Absolute: 0.4 10*3/uL (ref 0.1–1.0)
Monocytes Relative: 4.9 % (ref 3.0–12.0)
Neutro Abs: 6.1 10*3/uL (ref 1.4–7.7)
Neutrophils Relative %: 76.2 % (ref 43.0–77.0)
Platelets: 286 10*3/uL (ref 150.0–400.0)
RBC: 4.76 Mil/uL (ref 3.87–5.11)
RDW: 14 % (ref 11.5–15.5)
WBC: 8 10*3/uL (ref 4.0–10.5)

## 2023-07-28 LAB — LIPID PANEL
Cholesterol: 169 mg/dL (ref 0–200)
HDL: 58.7 mg/dL (ref >39.00)
LDL Cholesterol: 90 mg/dL (ref 0–99)
NonHDL: 110.52
Total CHOL/HDL Ratio: 3
Triglycerides: 102 mg/dL (ref 0.0–149.0)
VLDL: 20.4 mg/dL (ref 0.0–40.0)

## 2023-07-28 LAB — TSH: TSH: 3.43 u[IU]/mL (ref 0.35–5.50)

## 2023-07-28 LAB — HEMOGLOBIN A1C: Hgb A1c MFr Bld: 6 % (ref 4.6–6.5)

## 2023-07-28 LAB — MICROALBUMIN / CREATININE URINE RATIO
Creatinine,U: 131.9 mg/dL
Microalb Creat Ratio: 8.6 mg/g (ref 0.0–30.0)
Microalb, Ur: 1.1 mg/dL (ref 0.0–1.9)

## 2023-07-28 MED ORDER — FEXOFENADINE HCL 180 MG PO TABS: ORAL_TABLET | ORAL | 3 refills | Status: AC

## 2023-07-28 MED ORDER — POTASSIUM CHLORIDE ER 10 MEQ PO TBCR: 10.0000 meq | EXTENDED_RELEASE_TABLET | Freq: Every day | ORAL | 3 refills | Status: AC

## 2023-07-28 MED ORDER — HYDROCHLOROTHIAZIDE 25 MG PO TABS: 25.0000 mg | ORAL_TABLET | Freq: Every day | ORAL | 3 refills | Status: AC

## 2023-07-28 MED ORDER — METFORMIN HCL 500 MG PO TABS: ORAL_TABLET | ORAL | 3 refills | Status: AC

## 2023-07-28 MED ORDER — ATORVASTATIN CALCIUM 10 MG PO TABS: 10.0000 mg | ORAL_TABLET | Freq: Every day | ORAL | 3 refills | Status: AC

## 2023-07-28 MED ORDER — MOMETASONE FUROATE 50 MCG/ACT NA SUSP: 2.0000 | Freq: Every day | NASAL | 3 refills | Status: AC | PRN

## 2023-07-28 NOTE — Assessment & Plan Note (Signed)
 Disc goals for lipids and reasons to control them Rev last labs with pt Rev low sat fat diet in detail  Lab today  Good diet  Atorvastatin 10 mg daily

## 2023-07-28 NOTE — Assessment & Plan Note (Signed)
 A1c today and microalb  Has been in good control  Metformin 500 mg daily   Taking statin  Eye exam utd (had cataract surg as well)  Great diet and exercise habits

## 2023-07-28 NOTE — Assessment & Plan Note (Signed)
 Continues topical treatment/ gyn care

## 2023-07-28 NOTE — Assessment & Plan Note (Signed)
 bp in fair control at this time  BP Readings from Last 1 Encounters:  07/28/23 132/84   No changes needed Most recent labs reviewed  Disc lifstyle change with low sodium diet and exercise  Plan to continue hctz 25 mg daily with K  Watching GFR (takes both hctz and metformin)  Enc good fluid intake

## 2023-07-28 NOTE — Assessment & Plan Note (Signed)
 Dexa 04/2022  No falls or fracture Discussed fall prevention, supplements and exercise for bone density  Great exercise

## 2023-07-28 NOTE — Assessment & Plan Note (Signed)
 Allegra refilled Uses nasonex in season

## 2023-07-28 NOTE — Progress Notes (Signed)
 Subjective:    Patient ID: Laurie Tran, female    DOB: 1945-08-09, 78 y.o.   MRN: [id number]  HPI  Here for annual visit to review chronic medical problems   Wt Readings from Last 3 Encounters:  07/28/23 118 lb (53.5 kg)  01/22/23 119 lb 2 oz (54 kg)  01/19/23 121 lb (54.9 kg)   20.41 kg/m  Vitals:   07/28/23 0825  BP: 132/84  Pulse: 68  Temp: 98 F (36.7 C)  SpO2: 99%   Weight down  Thinks she eats enough  Gets protein    Immunization History  Administered Date(s) Administered   Fluad Quad(high Dose 65+) 02/09/2019, 03/07/2020   Fluad Trivalent(High Dose 65+) 01/22/2023   Influenza, High Dose Seasonal PF 01/22/2022   Influenza,inj,Quad PF,6+ Mos 01/18/2014, 02/16/2015, 02/08/2016, 02/06/2017, 01/27/2018, 02/26/2021   PFIZER(Purple Top)SARS-COV-2 Vaccination 07/10/2019, 08/02/2019, 03/24/2020   Pneumococcal Conjugate-13 01/18/2014   Pneumococcal Polysaccharide-23 05/28/2012, 01/19/2017   Td 07/18/2021   Tdap 05/30/2011   Zoster Recombinant(Shingrix) 01/26/2022, 07/05/2022   Zoster, Live 02/03/2012    Health Maintenance Due  Topic Date Due   Medicare Annual Wellness (AWV)  Never done   OPHTHALMOLOGY EXAM  04/02/2023   MAMMOGRAM  05/24/2023   Diabetic kidney evaluation - Urine ACR  07/15/2023   HEMOGLOBIN A1C  07/22/2023   Doing well  Knee repl then renal stone then covid this year    Mammogram is due Dewayne Hatch to schedule  Self breast exam- no lump   Gyn health No problems  Is treated for lichen sclerosis    Colon cancer screening   colonoscopy 01/2023 with 10 y recall   Bone health  Dexa  04/2022  osteopenia left hip  Falls- none  Fractures-none  Supplements  Last vitamin D Lab Results  Component Value Date   VD25OH 48.3 02/24/2020    Exercise  Had a knee replacement and it went well  Exercises daily  Recumbent bike Some walking  Lifts weights  Rowing machine    Mood    07/24/2022    9:31 AM 07/18/2021    9:26 AM 03/07/2020    11:35 AM 03/04/2019    3:03 PM 01/27/2018    3:17 PM  Depression screen PHQ 2/9  Decreased Interest 0 0 0 0 0  Down, Depressed, Hopeless 0 0 0 0 0  PHQ - 2 Score 0 0 0 0 0  Altered sleeping 0  0 0   Tired, decreased energy 0  0 0   Change in appetite 0  0 0   Feeling bad or failure about yourself  0  0 0   Trouble concentrating 0  0 0   Moving slowly or fidgety/restless 0  0 0   Suicidal thoughts 0  0 0   PHQ-9 Score 0  0 0   Difficult doing work/chores Not difficult at all  Not difficult at all Not difficult at all    HTN bp is stable today  No cp or palpitations or headaches or edema  No side effects to medicines  BP Readings from Last 3 Encounters:  07/28/23 132/84  02/17/23 130/70  02/05/23 (!) 149/72     Hydrochlorothiazide 25 mg daily  Klor con 10 meq daily  Lab Results  Component Value Date   NA 138 01/19/2023   K 3.9 01/19/2023   CO2 25 09/10/2022   GLUCOSE 85 01/19/2023   BUN 17 01/19/2023   CREATININE 0.79 01/19/2023   CALCIUM 9.4 01/19/2023   EGFR  78 01/19/2023   GFRNONAA 74 02/24/2020   Due for labs    DM2 Lab Results  Component Value Date   HGBA1C 5.8 (H) 01/19/2023   HGBA1C 6.0 (H) 09/10/2022   HGBA1C 5.9 (H) 07/14/2022   Due for labs Metformin 500 mg daily   Eating very healthy    Lab Results  Component Value Date   LABMICR <3.0 07/14/2022   LABMICR See below: 12/21/2020   Due for cholesterol test  Lab Results  Component Value Date   CHOL 175 01/19/2023   CHOL 161 07/14/2022   CHOL 164 07/08/2021   Lab Results  Component Value Date   HDL 55 01/19/2023   HDL 57 07/14/2022   HDL 56 07/08/2021   Lab Results  Component Value Date   LDLCALC 98 01/19/2023   LDLCALC 87 07/14/2022   LDLCALC 86 07/08/2021   Lab Results  Component Value Date   TRIG 124 01/19/2023   TRIG 95 07/14/2022   TRIG 126 07/08/2021   Lab Results  Component Value Date   CHOLHDL 3.2 01/19/2023   CHOLHDL 2.8 07/14/2022   CHOLHDL 2.9 07/08/2021   No  results found for: "LDLDIRECT" Lipitor 10 mg daily    Patient Active Problem List   Diagnosis Date Noted   Encounter for screening mammogram for breast cancer 07/28/2023   Elevated LDH 07/24/2022   Osteopenia 06/08/2022   Lichen sclerosus of female genitalia 06/25/2020   Cotton wool spots 12/26/2019   Encounter for hepatitis C screening test for low risk patient 01/19/2017   Estrogen deficiency 01/19/2017   H/O herpes zoster 11/13/2011   Routine general medical examination at a health care facility 05/30/2011   Colon polyps 05/30/2011   Type 2 diabetes mellitus with complication, without long-term current use of insulin (HCC) 10/29/2010   GERD (gastroesophageal reflux disease) 10/29/2010   HTN (hypertension) 10/29/2010   Hyperlipidemia associated with type 2 diabetes mellitus (HCC) 10/29/2010   History of kidney stones 10/29/2010   Perennial allergic rhinitis 10/29/2010   Past Medical History:  Diagnosis Date   Allergic rhinitis    Allergy 1967   Cataract 2020   Removed   Frequency of urination    GERD (gastroesophageal reflux disease)    Hematuria    History of echocardiogram    in epic 12-22-2006  mild AV sclerosis without stenosis,  normal LVF, ef 55-60%,  trivial TR   History of gastroesophageal reflux (GERD)    per pt no issues since stopped drinking soda's   History of kidney stones    History of nuclear stress test    in epic 09-25-2005 normal with no ischemia, ef 69%   Hypertension    Nephrolithiasis    bilateral non-obstructive per CT 11-25-2018   Nocturia    OA (osteoarthritis)    Right ureteral stone    Type 2 diabetes mellitus (HCC)    followed by pcp   Urgency of urination    Wears glasses    Past Surgical History:  Procedure Laterality Date   BREAST SURGERY  1985   breast biopsy benign   CYSTOSCOPY W/ URETERAL STENT PLACEMENT Right 11/25/2018   Procedure: CYSTOSCOPY WITH RETROGRADE PYELOGRAM/URETERAL STENT PLACEMENT;  Surgeon: Malen Gauze,  MD;  Location: [facility];  Service: Urology;  Laterality: Right;   CYSTOSCOPY WITH RETROGRADE PYELOGRAM, URETEROSCOPY AND STENT PLACEMENT Left 10/21/2016   Procedure: CYSTOSCOPY WITH RETROGRADE PYELOGRAM, LEFT URETEROSCOPY PYELOSCOPY  AND LEFT STENT PLACEMENT;  Surgeon: Jethro Bolus, MD;  Location: [facility];  Service: Urology;  Laterality: Left;   CYSTOSCOPY WITH RETROGRADE PYELOGRAM, URETEROSCOPY AND STENT PLACEMENT Right 12/20/2018   Procedure: CYSTOSCOPY WITH RIGHT RETROGRADE URETEROSCOPY WITH LASER  AND STENT REPLACEMENT;  Surgeon: Malen Gauze, MD;  Location: [facility];  Service: Urology;  Laterality: Right;   CYSTOSCOPY/RETROGRADE/URETEROSCOPY/STONE EXTRACTION WITH BASKET  yrs ago   CYSTOSCOPY/URETEROSCOPY/HOLMIUM LASER/STENT PLACEMENT Right 09/25/2021   Procedure: CYSTOSCOPY/URETEROSCOPY/ RETROGRADE/HOLMIUM LASER/STENT PLACEMENT;  Surgeon: Rene Paci, MD;  Location[facility];  Service: Urology;  Laterality: Right;   EXTRACORPOREAL SHOCK WAVE LITHOTRIPSY  01-06-2011   @[facility]    EXTRACORPOREAL SHOCK WAVE LITHOTRIPSY Right 08/11/2019   Procedure: EXTRACORPOREAL SHOCK WAVE LITHOTRIPSY (ESWL);  Surgeon: Sebastian Ache, MD;  Location[facility];  Service: Urology;  Laterality: Right;   EXTRACORPOREAL SHOCK WAVE LITHOTRIPSY Left 03/01/2020   Procedure: LEFT EXTRACORPOREAL SHOCK WAVE LITHOTRIPSY (ESWL);  Surgeon: Noel Christmas, MD;  Location: [facility];  Service: Urology;  Laterality: Left;   EXTRACORPOREAL SHOCK WAVE LITHOTRIPSY Right 11/07/2022   Procedure: RIGHT EXTRACORPOREAL SHOCK WAVE LITHOTRIPSY (ESWL);  Surgeon: Alfredo Martinez, MD;  Location: [facility];  Service: Urology;  Laterality: Right;  75 MINUTES   HOLMIUM LASER APPLICATION Right 12/20/2018   Procedure: HOLMIUM LASER APPLICATION;  Surgeon: Malen Gauze, MD;  Location: [facility];  Service: Urology;   Laterality: Right;   JOINT REPLACEMENT  April 2024   Right knee replacement   KNEE ARTHROSCOPY Right 2006   Social History   Tobacco Use   Smoking status: Never    Passive exposure: Past   Smokeless tobacco: Never  Vaping Use   Vaping status: Never Used  Substance Use Topics   Alcohol use: No   Drug use: No   Family History  Problem Relation Age of Onset   Cancer Mother        breast CA   Arthritis Paternal Grandmother    Stroke Paternal Grandmother    Diabetes Paternal Grandmother    Asthma Paternal Grandmother    Cancer Sister    Breast cancer Sister    Colon cancer Brother    Cancer Brother    Arthritis Maternal Grandmother    Allergies  Allergen Reactions   Bactrim [Sulfamethoxazole-Trimethoprim] Anaphylaxis and Shortness Of Breath    Pt reported tongue swelling with open sores, ShOB, difficulty swallowing   Methocarbamol Hives and Anaphylaxis    Bronchitis/congestion/coughing   Nsaids Anaphylaxis, Hives and Shortness Of Breath    Breathing problems ;  Per pt "all anti-flammatory's "   Sulfa Antibiotics Anaphylaxis and Shortness Of Breath    Pt reports tongue swelling, ShOB, difficulty swallowing   Celebrex [Celecoxib] Hives and Other (See Comments)    Congestion   Mixed Grasses Other (See Comments)   Molds & Smuts Other (See Comments)   Myrbetriq Theodosia Paling Er] Other (See Comments)    Severe myalgias   Other Other (See Comments)    "needs to clear throat often with peanuts"   Tree Extract Other (See Comments)   Vioxx [Rofecoxib] Hives and Other (See Comments)    Congestion   Current Outpatient Medications on File Prior to Visit  Medication Sig Dispense Refill   Albuterol Sulfate (PROAIR RESPICLICK) 108 (90 Base) MCG/ACT AEPB Inhale 1-2 puffs into the lungs every 4 (four) hours as needed. 1 each 0   Cholecalciferol 25 MCG (1000 UT) capsule Take 1,000 Units by mouth daily.     Cranberry-Vitamin C-Probiotic (AZO CRANBERRY PO) Take 1 capsule  by mouth daily.      D-MANNOSE PO Take by mouth.     diphenhydrAMINE (BENADRYL) 25 MG tablet Take 25 mg by mouth at bedtime.     EPINEPHrine 0.3 mg/0.3 mL IJ SOAJ injection Inject 0.3 mg into the muscle as needed for anaphylaxis.     estradiol (ESTRACE) 0.1 MG/GM vaginal cream As directed by urology     Lactobacillus (PROBIOTIC ACIDOPHILUS PO) Take by mouth.     Multiple Vitamins-Minerals (PRESERVISION AREDS PO) Take by mouth.     triamcinolone cream (KENALOG) 0.1 % Apply 1 application topically 2 (two) times daily as needed.     No current facility-administered medications on file prior to visit.    Review of Systems  Constitutional:  Negative for activity change, appetite change, fatigue, fever and unexpected weight change.  HENT:  Negative for congestion, ear pain, rhinorrhea, sinus pressure and sore throat.   Eyes:  Negative for pain, redness and visual disturbance.  Respiratory:  Negative for cough, shortness of breath and wheezing.   Cardiovascular:  Negative for chest pain and palpitations.  Gastrointestinal:  Negative for abdominal pain, blood in stool, constipation and diarrhea.  Endocrine: Negative for polydipsia and polyuria.  Genitourinary:  Negative for dysuria, frequency and urgency.  Musculoskeletal:  Negative for arthralgias, back pain and myalgias.       Right knee replacement - cannot flex fully yet   Skin:  Negative for pallor and rash.  Allergic/Immunologic: Negative for environmental allergies.  Neurological:  Negative for dizziness, syncope and headaches.  Hematological:  Negative for adenopathy. Does not bruise/bleed easily.  Psychiatric/Behavioral:  Negative for decreased concentration and dysphoric mood. The patient is not nervous/anxious.        Objective:   Physical Exam Constitutional:      General: She is not in acute distress.    Appearance: Normal appearance. She is well-developed and normal weight. She is not ill-appearing or diaphoretic.  HENT:     Head:  Normocephalic and atraumatic.     Right Ear: Tympanic membrane, ear canal and external ear normal.     Left Ear: Tympanic membrane, ear canal and external ear normal.     Nose: Nose normal. No congestion.     Mouth/Throat:     Mouth: Mucous membranes are moist.     Pharynx: Oropharynx is clear. No posterior oropharyngeal erythema.  Eyes:     General: No scleral icterus.    Extraocular Movements: Extraocular movements intact.     Conjunctiva/sclera: Conjunctivae normal.     Pupils: Pupils are equal, round, and reactive to light.  Neck:     Thyroid: No thyromegaly.     Vascular: No carotid bruit or JVD.  Cardiovascular:     Rate and Rhythm: Normal rate and regular rhythm.     Pulses: Normal pulses.     Heart sounds: Normal heart sounds.     No gallop.  Pulmonary:     Effort: Pulmonary effort is normal. No respiratory distress.     Breath sounds: Normal breath sounds. No wheezing.     Comments: Good air exch Chest:     Chest wall: No tenderness.  Abdominal:     General: Bowel sounds are normal. There is no distension or abdominal bruit.     Palpations: Abdomen is soft. There is no mass.     Tenderness: There is no abdominal tenderness.     Hernia: No hernia is present.  Genitourinary:    Comments: Breast exam: No mass, nodules,  thickening, tenderness, bulging, retraction, inflamation, nipple discharge or skin changes noted.  No axillary or clavicular LA.     Musculoskeletal:        General: No tenderness. Normal range of motion.     Cervical back: Normal range of motion and neck supple. No rigidity. No muscular tenderness.     Right lower leg: No edema.     Left lower leg: No edema.     Comments: No kyphosis   Cannot fully flex right knee   Lymphadenopathy:     Cervical: No cervical adenopathy.  Skin:    General: Skin is warm and dry.     Coloration: Skin is not pale.     Findings: No erythema or rash.     Comments: Solar lentigines diffusely   Neurological:     Mental  Status: She is alert. Mental status is at baseline.     Cranial Nerves: No cranial nerve deficit.     Motor: No abnormal muscle tone.     Coordination: Coordination normal.     Gait: Gait normal.     Deep Tendon Reflexes: Reflexes are normal and symmetric. Reflexes normal.  Psychiatric:        Mood and Affect: Mood normal.        Cognition and Memory: Cognition and memory normal.           Assessment & Plan:   Problem List Items Addressed This Visit       Cardiovascular and Mediastinum   HTN (hypertension) - Primary   bp in fair control at this time  BP Readings from Last 1 Encounters:  07/28/23 132/84   No changes needed Most recent labs reviewed  Disc lifstyle change with low sodium diet and exercise  Plan to continue hctz 25 mg daily with K  Watching GFR (takes both hctz and metformin)  Enc good fluid intake      Relevant Medications   atorvastatin (LIPITOR) 10 MG tablet   hydrochlorothiazide (HYDRODIURIL) 25 MG tablet   Other Relevant Orders   Comprehensive metabolic panel   CBC with Differential/Platelet   Lipid Panel   TSH     Respiratory   Perennial allergic rhinitis   Allegra refilled Uses nasonex in season        Digestive   Colon polyps   Colonoscopy utd 01/2023         Endocrine   Type 2 diabetes mellitus with complication, without long-term current use of insulin (HCC)   A1c today and microalb  Has been in good control  Metformin 500 mg daily   Taking statin  Eye exam utd (had cataract surg as well)  Great diet and exercise habits        Relevant Medications   atorvastatin (LIPITOR) 10 MG tablet   metFORMIN (GLUCOPHAGE) 500 MG tablet   Other Relevant Orders   Hemoglobin A1c   Microalbumin / creatinine urine ratio   Hyperlipidemia associated with type 2 diabetes mellitus (HCC)   Disc goals for lipids and reasons to control them Rev last labs with pt Rev low sat fat diet in detail  Lab today  Good diet  Atorvastatin 10 mg  daily      Relevant Medications   atorvastatin (LIPITOR) 10 MG tablet   hydrochlorothiazide (HYDRODIURIL) 25 MG tablet   metFORMIN (GLUCOPHAGE) 500 MG tablet   Other Relevant Orders   Comprehensive metabolic panel   Lipid Panel     Musculoskeletal and Integument   Osteopenia  Dexa 04/2022  No falls or fracture Discussed fall prevention, supplements and exercise for bone density  Great exercise         Genitourinary   Lichen sclerosus of female genitalia   Continues topical treatment/ gyn care         Other   Encounter for screening mammogram for breast cancer   Mammogram ordered for [facility] Pt will schedule       Relevant Orders   MM 3D SCREENING MAMMOGRAM BILATERAL BREAST   Elevated LDH

## 2023-07-28 NOTE — Patient Instructions (Addendum)
 Keep up the good work with diet and exercise  Use sun protection  Labs today    You have an order for:  []   2D Mammogram  [x]   3D Mammogram  []   Bone Density     Please call for appointment:   []   [facility]  [address] [place] [address]  [phone]  []   [facility])   [address]  [place], [place] [address]  [phone]  []   [facility]      [address] [place], [place]        [phone]         [x]   [facility]  [address] [place], [place]  [phone]  []  [facility]   [address]   [place], [place] [address]  [phone]  []  [facility]  [address] [place], [place] [address] [phone]    Make sure to wear two piece clothing  No lotions powders or deodorants the day of the appointment Make sure to bring picture ID and insurance card.  Bring list of medications you are currently taking including any supplements.   Schedule your screening mammogram through MyChart!   Select [place] imaging sites can now be scheduled through MyChart.  Log into your MyChart account.  Go to 'Visit' (or 'Appointments' if  on mobile App) --> Schedule an  Appointment  Under 'Select a Reason for Visit' choose the Mammogram  Screening option.  Complete the pre-visit questions  and select the time and place that  best fits your schedule

## 2023-07-28 NOTE — Assessment & Plan Note (Signed)
 Mammogram ordered for [facility] Pt will schedule

## 2023-07-28 NOTE — Assessment & Plan Note (Signed)
 Colonoscopy utd 01/2023

## 2023-07-29 ENCOUNTER — Encounter: Payer: Self-pay | Admitting: *Deleted

## 2023-08-15 LAB — HM MAMMOGRAPHY

## 2023-08-18 ENCOUNTER — Encounter: Payer: Self-pay | Admitting: Family Medicine

## 2024-04-06 LAB — OPHTHALMOLOGY REPORT-SCANNED

## 2024-07-21 ENCOUNTER — Other Ambulatory Visit

## 2024-07-28 ENCOUNTER — Encounter: Admitting: Family Medicine
